# Patient Record
Sex: Male | Born: 1945
Health system: Southern US, Community
[De-identification: ages and names within clinical notes are randomized; demographics above are authoritative.]

## PROBLEM LIST (undated history)

## (undated) DIAGNOSIS — M25519 Pain in unspecified shoulder: Secondary | ICD-10-CM

## (undated) DIAGNOSIS — R011 Cardiac murmur, unspecified: Secondary | ICD-10-CM

## (undated) DIAGNOSIS — K759 Inflammatory liver disease, unspecified: Secondary | ICD-10-CM

## (undated) HISTORY — DX: Cardiac murmur, unspecified: R01.1

## (undated) HISTORY — DX: Pain in unspecified shoulder: M25.519

## (undated) HISTORY — PX: TONSILLECTOMY: SHX5217

## (undated) HISTORY — PX: TONSILLECTOMY: SUR1361

## (undated) HISTORY — PX: OTHER SURGICAL HISTORY: SHX169

---
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
## 1997-07-19 ENCOUNTER — Emergency Department (HOSPITAL_COMMUNITY): Admission: EM | Admit: 1997-07-19 | Discharge: 1997-07-19 | Payer: Self-pay | Admitting: Emergency Medicine

## 1998-03-24 ENCOUNTER — Ambulatory Visit (HOSPITAL_COMMUNITY): Admission: RE | Admit: 1998-03-24 | Discharge: 1998-03-24 | Payer: Self-pay | Admitting: Gastroenterology

## 2004-02-29 ENCOUNTER — Ambulatory Visit (HOSPITAL_COMMUNITY): Admission: RE | Admit: 2004-02-29 | Discharge: 2004-02-29 | Payer: Self-pay | Admitting: Gastroenterology

## 2009-08-01 DIAGNOSIS — M25519 Pain in unspecified shoulder: Secondary | ICD-10-CM

## 2009-08-01 HISTORY — DX: Pain in unspecified shoulder: M25.519

## 2009-11-12 ENCOUNTER — Encounter: Admission: RE | Admit: 2009-11-12 | Discharge: 2009-11-12 | Payer: Self-pay | Admitting: Cardiology

## 2009-11-12 ENCOUNTER — Ambulatory Visit: Payer: Self-pay | Admitting: Cardiology

## 2009-11-25 ENCOUNTER — Ambulatory Visit (HOSPITAL_COMMUNITY): Admission: RE | Admit: 2009-11-25 | Discharge: 2009-11-25 | Payer: Self-pay | Admitting: Cardiology

## 2009-11-25 ENCOUNTER — Ambulatory Visit: Payer: Self-pay | Admitting: Cardiology

## 2010-08-19 NOTE — Op Note (Signed)
[name], [name]                ACCOUNT NO.:  [id number]   MEDICAL RECORD NO.:  [id number]          PATIENT TYPE:  AMB   LOCATION:  ENDO                         FACILITY:  [facility]   PHYSICIAN:  Bernette Redbird, M.D.   DATE OF BIRTH:  Sep 05, 1945   DATE OF PROCEDURE:  02/29/2004  DATE OF DISCHARGE:                                 OPERATIVE REPORT   PROCEDURE:  Colonoscopy.   INDICATIONS:  Screening for colon cancer in a 65 year old gentleman with a  family history of colon cancer in his father who was diagnosed around age  19.   FINDINGS:  Normal exam.   PROCEDURE:  The major purpose and risks of the procedure were familiar to  the patient from prior examination, and he provided written consent.  Sedation was fentanyl 50 cg and Versed 6 mg IV without arrhythmias or  desaturation. Digital exam of the prostate was unremarkable. The Olympus  adjustable tension pediatric video colonoscope was readily advanced to the  proximal colon and then advanced into the cecum by having the patient turn  into the supine position. I was able to enter the terminal ileum for a short  distance, and it appeared normal.   This was a normal examination, other than perhaps some mild diverticulosis.  No polyps, cancer, colitis, or vascular malformations were observed, and no  definite or extensive diverticulosis were appreciated, but I do think there  was a small amount distally. Retroflexion of the rectum was unremarkable.  Pull out through the anal canal demonstrated moderate internal hemorrhoids.   No biopsies were obtained. The patient tolerated the procedure well, and  there were no apparent complications.   IMPRESSION:  Family history of colon cancer without worrisome findings on  current examination (V16.0).   PLAN:  Followup colonoscopy in about 5 years.      RB/MEDQ  D:  02/29/2004  T:  02/29/2004  Job:  [id number]

## 2010-09-07 ENCOUNTER — Other Ambulatory Visit: Payer: Self-pay | Admitting: Cardiology

## 2010-09-07 DIAGNOSIS — F419 Anxiety disorder, unspecified: Secondary | ICD-10-CM

## 2010-09-07 NOTE — Telephone Encounter (Signed)
escribe request  

## 2010-09-14 ENCOUNTER — Other Ambulatory Visit: Payer: Self-pay | Admitting: *Deleted

## 2010-09-14 DIAGNOSIS — E785 Hyperlipidemia, unspecified: Secondary | ICD-10-CM

## 2010-09-14 DIAGNOSIS — I10 Essential (primary) hypertension: Secondary | ICD-10-CM

## 2010-09-14 MED ORDER — LISINOPRIL 40 MG PO TABS: 40.0000 mg | ORAL_TABLET | Freq: Every day | ORAL | Status: DC

## 2010-09-14 MED ORDER — FINASTERIDE 5 MG PO TABS: 5.0000 mg | ORAL_TABLET | Freq: Every day | ORAL | Status: DC

## 2010-09-14 MED ORDER — BUPROPION HCL 100 MG PO TABS: 100.0000 mg | ORAL_TABLET | Freq: Two times a day (BID) | ORAL | Status: DC

## 2010-09-14 MED ORDER — SIMVASTATIN 20 MG PO TABS: 20.0000 mg | ORAL_TABLET | Freq: Every evening | ORAL | Status: DC

## 2010-09-14 NOTE — Telephone Encounter (Signed)
Refilled meds per fax request. Faxed signed rx's to [facility]

## 2010-12-15 ENCOUNTER — Other Ambulatory Visit: Payer: Self-pay | Admitting: Cardiology

## 2010-12-15 DIAGNOSIS — N400 Enlarged prostate without lower urinary tract symptoms: Secondary | ICD-10-CM

## 2010-12-15 DIAGNOSIS — E785 Hyperlipidemia, unspecified: Secondary | ICD-10-CM

## 2010-12-15 DIAGNOSIS — I119 Hypertensive heart disease without heart failure: Secondary | ICD-10-CM

## 2010-12-15 DIAGNOSIS — F329 Major depressive disorder, single episode, unspecified: Secondary | ICD-10-CM

## 2010-12-15 NOTE — Telephone Encounter (Signed)
escribe request  

## 2010-12-24 ENCOUNTER — Encounter: Payer: Self-pay | Admitting: Cardiology

## 2011-01-04 ENCOUNTER — Ambulatory Visit (INDEPENDENT_AMBULATORY_CARE_PROVIDER_SITE_OTHER): Payer: Medicare Other | Admitting: *Deleted

## 2011-01-04 DIAGNOSIS — E7800 Pure hypercholesterolemia, unspecified: Secondary | ICD-10-CM

## 2011-01-04 LAB — HEPATIC FUNCTION PANEL
AST: 23 U/L (ref 0–37)
Albumin: 4.1 g/dL (ref 3.5–5.2)

## 2011-01-04 LAB — LIPID PANEL
LDL Cholesterol: 84 mg/dL (ref 0–99)
Triglycerides: 49 mg/dL (ref 0.0–149.0)

## 2011-01-04 LAB — BASIC METABOLIC PANEL
BUN: 22 mg/dL (ref 6–23)
CO2: 27 mEq/L (ref 19–32)
Calcium: 8.7 mg/dL (ref 8.4–10.5)
GFR: 79.12 mL/min (ref >60.00)

## 2011-01-10 ENCOUNTER — Ambulatory Visit: Payer: Self-pay | Admitting: Cardiology

## 2011-01-12 ENCOUNTER — Ambulatory Visit (INDEPENDENT_AMBULATORY_CARE_PROVIDER_SITE_OTHER): Payer: Medicare Other | Admitting: Cardiology

## 2011-01-12 ENCOUNTER — Encounter: Payer: Self-pay | Admitting: Cardiology

## 2011-01-12 VITALS — BP 142/91 | HR 66 | Ht 72.0 in | Wt 186.0 lb

## 2011-01-12 DIAGNOSIS — I350 Nonrheumatic aortic (valve) stenosis: Secondary | ICD-10-CM

## 2011-01-12 DIAGNOSIS — E7800 Pure hypercholesterolemia, unspecified: Secondary | ICD-10-CM

## 2011-01-12 DIAGNOSIS — N138 Other obstructive and reflux uropathy: Secondary | ICD-10-CM | POA: Insufficient documentation

## 2011-01-12 DIAGNOSIS — I119 Hypertensive heart disease without heart failure: Secondary | ICD-10-CM

## 2011-01-12 DIAGNOSIS — I359 Nonrheumatic aortic valve disorder, unspecified: Secondary | ICD-10-CM

## 2011-01-12 DIAGNOSIS — N401 Enlarged prostate with lower urinary tract symptoms: Secondary | ICD-10-CM

## 2011-01-12 DIAGNOSIS — N400 Enlarged prostate without lower urinary tract symptoms: Secondary | ICD-10-CM

## 2011-01-12 DIAGNOSIS — E785 Hyperlipidemia, unspecified: Secondary | ICD-10-CM

## 2011-01-12 NOTE — Progress Notes (Signed)
Art Buff Date of Birth:  1945-12-21 [facility] [address]   [address] [place], [place]  [address] [phone]           Fax   [phone]  History of Present Illness: This pleasant 65 year old gentleman is seen for a scheduled followup office visit.  He has a past history of labile hypertension and a heart murmur, consistent with moderate aortic stenosis.  Also, has a history of benign prostatic hypertrophy.  Since last, visit he's been feeling well.  He is not expressing any chest pain or shortness of breath.  He has no specific complaints today.  Current Outpatient Prescriptions  Medication Sig Dispense Refill  . ALPRAZolam (XANAX) 1 MG tablet TAKE ONE TABLET BY MOUTH AT BEDTIME.  90 tablet  1  . aspirin 81 MG tablet Take 81 mg by mouth daily.        [name] buPROPion (WELLBUTRIN) 100 MG tablet TAKE 1 TABLET TWICE DAILY  180 tablet  3  . calcium carbonate (OS-CAL) 600 MG TABS Take 600 mg by mouth daily.        [name] CINNAMON PO Take 2,000 mg by mouth daily.        . Cyanocobalamin (VITAMIN B-12 PO) Take by mouth.        . dutasteride (AVODART) 0.5 MG capsule Take 0.5 mg by mouth daily.        . finasteride (PROSCAR) 5 MG tablet TAKE 1 TABLET DAILY  90 tablet  3  . fish oil-omega-3 fatty acids 1000 MG capsule Take 2 g by mouth daily.        . Flaxseed, Linseed, (FLAXSEED OIL PO) Take 2,600 mg by mouth daily.        . Garlic 2000 MG CAPS Take 4,000 mg by mouth daily.        . Glucosamine HCl-Glucosamin SO4 (GLUCOSAMINE COMPLEX PO) Take 4,000 mg by mouth daily.        [name] lisinopril (PRINIVIL,ZESTRIL) 40 MG tablet TAKE 1 TABLET DAILY  90 tablet  3  . MAGNESIUM PO Take by mouth.        . NON FORMULARY Take by mouth daily. LAX CLEAR MIX WITH WATER       . Red Yeast Rice Extract (RED YEAST RICE PO) Take by mouth.        . Saw Palmetto, Serenoa repens, (SAW PALMETTO PO) Take by mouth.        . simvastatin (ZOCOR) 20 MG tablet TAKE 1 TABLET AT BEDTIME  90 tablet  3    . vitamin E 400 UNIT capsule Take 800 Units by mouth daily.          No Known Allergies  Patient Active Problem List  Diagnoses  . Aortic stenosis    History  Smoking status  . Former Smoker -- 2.0 packs/day for 8 years  . Types: Cigarettes  . Quit date: 01/11/1969  Smokeless tobacco  . Never Used    History  Alcohol Use No    Family History  Problem Relation Age of Onset  . Cancer Father     Liver  . Heart disease Mother   . Multiple myeloma Brother     Review of Systems: Constitutional: no fever chills diaphoresis or fatigue or change in weight.  Head and neck: no hearing loss, no epistaxis, no photophobia or visual disturbance. Respiratory: No cough, shortness of breath or wheezing. Cardiovascular: No chest pain peripheral edema, palpitations. Gastrointestinal: No abdominal distention,  no abdominal pain, no change in bowel habits hematochezia or melena. Genitourinary: No dysuria, no frequency, no urgency, no nocturia. Musculoskeletal:No arthralgias, no back pain, no gait disturbance or myalgias. Neurological: No dizziness, no headaches, no numbness, no seizures, no syncope, no weakness, no tremors. Hematologic: No lymphadenopathy, no easy bruising. Psychiatric: No confusion, no hallucinations, no sleep disturbance.    Physical Exam: Filed Vitals:   01/12/11 1348  BP: 142/91  Pulse: 66   his blood pressure on recheck at the end of the exam is better at 134/80.Pupils equal and reactive.   Extraocular Movements are full.  There is no scleral icterus.  The mouth and pharynx are normal.  The neck is supple.  The carotids reveal no bruits.  The jugular venous pressure is normal.  The thyroid is not enlarged.  There is no lymphadenopathy.  The chest is clear to percussion and auscultation. There are no rales or rhonchi. Expansion of the chest is symmetrical.  Heart reveals a grade 3/6 harsh systolic ejection murmur at the base. The abdomen is soft and nontender.  Bowel sounds are normal. The liver and spleen are not enlarged. There Are no abdominal masses. There are no bruits.  Rectal exam reveals 2+ enlarged prostate without hard nodules.  Stool brown, benzidine negative.   The pedal pulses are good.  There is no phlebitis or edema.  There is no cyanosis or clubbing. Strength is normal and symmetrical in all extremities.  There is no lateralizing weakness.  There are no sensory deficits.  The skin is warm and dry.  There is no rash.  His EKG today shows normal sinus rhythm and is within normal limits.  This was interpreted by me   Assessment / Plan:  Continue same medication.  Recheck in one year for followup visit.  Consider repeat echocardiogram at that time

## 2011-01-12 NOTE — Assessment & Plan Note (Signed)
Patient has symptoms of urgency and frequency.  Examination today does reveal moderate enlargement of the prostate without any hard nodules.  His PSAs in the past have been normal.  He is already on 2 medications for his prostate and is still having symptoms.  He will consult his urologist, Dr. Gaynelle Arabian

## 2011-01-12 NOTE — Assessment & Plan Note (Signed)
Patient has a history of mild essential hypertension.  Not expressing any headaches or dizzy spells.  His exercise tolerance is good.  He is not having any cough or side effects from the lisinopril.

## 2011-01-12 NOTE — Assessment & Plan Note (Signed)
The patient is on simvastatin for his hypercholesterolemia.  He has not had any side effects from the simvastatin.  His lipids are excellent

## 2011-01-12 NOTE — Assessment & Plan Note (Signed)
The patient has a history of aortic stenosis.  His last echocardiogram was on 11/25/09 and showed a peak instantaneous gradient of 49 and a mean gradient of 32.  The patient denies any exertional dyspnea, chest pain, dizziness, or syncope.

## 2011-03-21 ENCOUNTER — Other Ambulatory Visit: Payer: Self-pay | Admitting: Cardiology

## 2011-03-21 DIAGNOSIS — F419 Anxiety disorder, unspecified: Secondary | ICD-10-CM

## 2011-03-27 ENCOUNTER — Other Ambulatory Visit: Payer: Self-pay | Admitting: *Deleted

## 2011-03-27 DIAGNOSIS — F419 Anxiety disorder, unspecified: Secondary | ICD-10-CM

## 2011-03-27 MED ORDER — ALPRAZOLAM 1 MG PO TABS: 1.0000 mg | ORAL_TABLET | Freq: Every evening | ORAL | Status: DC | PRN

## 2011-03-27 NOTE — Telephone Encounter (Signed)
Refill on alprazolam 

## 2011-10-31 ENCOUNTER — Other Ambulatory Visit: Payer: Self-pay | Admitting: Cardiology

## 2011-10-31 NOTE — Telephone Encounter (Signed)
..   Requested Prescriptions   Signed Prescriptions Disp Refills  . lisinopril (PRINIVIL,ZESTRIL) 40 MG tablet 90 tablet 2    Sig: TAKE 1 TABLET DAILY    Authorizing Provider: Rollene Rotunda    Ordering User: Jatniel Verastegui M  . simvastatin (ZOCOR) 20 MG tablet 90 tablet 3    Sig: TAKE 1 TABLET AT BEDTIME    Authorizing Provider: Rollene Rotunda    Ordering User: Annamay Laymon M   Refused Prescriptions Disp Refills  . buPROPion (WELLBUTRIN) 100 MG tablet [Pharmacy Med Name: BUPROPION    TAB 100MG ] 180 tablet PRN    Sig: TAKE 1 TABLET TWICE DAILY    Refused By: Christella Hartigan, Lenni Reckner M    Reason for Refusal: Refill not appropriate  . finasteride (PROSCAR) 5 MG tablet [Pharmacy Med Name: FINASTERIDE  TAB 5MG ] 90 tablet PRN    Sig: TAKE 1 TABLET DAILY    Refused By: Christella Hartigan, Hartley Wyke M    Reason for Refusal: Refill not appropriate

## 2011-11-21 ENCOUNTER — Encounter: Payer: Self-pay | Admitting: *Deleted

## 2011-11-22 ENCOUNTER — Encounter: Payer: Self-pay | Admitting: Cardiology

## 2011-11-22 ENCOUNTER — Ambulatory Visit (INDEPENDENT_AMBULATORY_CARE_PROVIDER_SITE_OTHER): Payer: Medicare Other | Admitting: Cardiology

## 2011-11-22 VITALS — BP 120/78 | HR 80 | Ht 72.0 in | Wt 185.0 lb

## 2011-11-22 DIAGNOSIS — I350 Nonrheumatic aortic (valve) stenosis: Secondary | ICD-10-CM

## 2011-11-22 DIAGNOSIS — N401 Enlarged prostate with lower urinary tract symptoms: Secondary | ICD-10-CM

## 2011-11-22 DIAGNOSIS — E7800 Pure hypercholesterolemia, unspecified: Secondary | ICD-10-CM

## 2011-11-22 DIAGNOSIS — F419 Anxiety disorder, unspecified: Secondary | ICD-10-CM

## 2011-11-22 DIAGNOSIS — N400 Enlarged prostate without lower urinary tract symptoms: Secondary | ICD-10-CM

## 2011-11-22 DIAGNOSIS — I359 Nonrheumatic aortic valve disorder, unspecified: Secondary | ICD-10-CM

## 2011-11-22 DIAGNOSIS — I119 Hypertensive heart disease without heart failure: Secondary | ICD-10-CM

## 2011-11-22 DIAGNOSIS — F411 Generalized anxiety disorder: Secondary | ICD-10-CM

## 2011-11-22 MED ORDER — ALPRAZOLAM 1 MG PO TABS: 1.0000 mg | ORAL_TABLET | Freq: Every evening | ORAL | Status: DC | PRN

## 2011-11-22 NOTE — Patient Instructions (Signed)
Your physician recommends that you continue on your current medications as directed. Please refer to the Current Medication list given to you today.  Follow up with  Dr. Patty Sermons in October

## 2011-11-22 NOTE — Progress Notes (Signed)
Art Buff Date of Birth:  1945/04/13 [facility] [address] [place], [place]  [address] [phone]  Fax   [phone]  HPI: This pleasant 66 year old gentleman is seen for a scheduled followup office visit. He has a past history of labile hypertension and a heart murmur, consistent with moderate aortic stenosis. Also, has a history of benign prostatic hypertrophy. Since last, visit he's been feeling well. He is not expressing any chest pain or shortness of breath. He has no specific complaints today. He came in today because he had run out of some of his medication.  He has not been experiencing any new cardiac symptoms.  Current Outpatient Prescriptions  Medication Sig Dispense Refill  . ALPRAZolam (XANAX) 1 MG tablet Take 1 tablet (1 mg total) by mouth at bedtime as needed for sleep.  90 tablet  3  . aspirin 81 MG tablet Take 81 mg by mouth daily.        [name] buPROPion (WELLBUTRIN) 100 MG tablet TAKE 1 TABLET TWICE DAILY  180 tablet  3  . calcium carbonate (OS-CAL) 600 MG TABS Take 600 mg by mouth daily.        [name] CINNAMON PO Take 2,000 mg by mouth daily.        . Cyanocobalamin (VITAMIN B-12 PO) Take by mouth.        . dutasteride (AVODART) 0.5 MG capsule Take 0.5 mg by mouth daily.        . finasteride (PROSCAR) 5 MG tablet TAKE 1 TABLET DAILY  90 tablet  3  . fish oil-omega-3 fatty acids 1000 MG capsule Take 2 g by mouth daily.        . Flaxseed, Linseed, (FLAXSEED OIL PO) Take 2,600 mg by mouth daily.        . Garlic 2000 MG CAPS Take 4,000 mg by mouth daily.        . Glucosamine HCl-Glucosamin SO4 (GLUCOSAMINE COMPLEX PO) Take 4,000 mg by mouth daily.        [name] lisinopril (PRINIVIL,ZESTRIL) 40 MG tablet TAKE 1 TABLET DAILY  90 tablet  2  . MAGNESIUM PO Take by mouth.        . NON FORMULARY Take by mouth daily. LAX CLEAR MIX WITH WATER       . Red Yeast Rice Extract (RED YEAST RICE PO) Take by mouth.        . Saw Palmetto, Serenoa repens, (SAW PALMETTO  PO) Take by mouth.        . simvastatin (ZOCOR) 20 MG tablet TAKE 1 TABLET AT BEDTIME  90 tablet  3  . vitamin E 400 UNIT capsule Take 800 Units by mouth daily.        . Tamsulosin HCl (FLOMAX) 0.4 MG CAPS as directed.        No Known Allergies  Patient Active Problem List  Diagnosis  . Aortic stenosis  . Benign hypertensive heart disease without heart failure  . Dyslipidemia  . BPH with obstruction/lower urinary tract symptoms    History  Smoking status  . Former Smoker -- 2.0 packs/day for 8 years  . Types: Cigarettes  . Quit date: 01/11/1969  Smokeless tobacco  . Never Used    History  Alcohol Use No    Family History  Problem Relation Age of Onset  . Liver cancer Father   . Heart disease Mother   . Multiple myeloma Brother     Review of Systems: The patient denies any heat  or cold intolerance.  No weight gain or weight loss.  The patient denies headaches or blurry vision.  There is no cough or sputum production.  The patient denies dizziness.  There is no hematuria or hematochezia.  The patient denies any muscle aches or arthritis.  The patient denies any rash.  The patient denies frequent falling or instability.  There is no history of depression or anxiety.  All other systems were reviewed and are negative.   Physical Exam: Filed Vitals:   11/22/11 1147  BP: 120/78  Pulse: 80   the general appearance reveals a well-developed well-nourished gentleman in no distress.The head and neck exam reveals pupils equal and reactive.  Extraocular movements are full.  There is no scleral icterus.  The mouth and pharynx are normal.  The neck is supple.  The carotids reveal no bruits.  The jugular venous pressure is normal.  The  thyroid is not enlarged.  There is no lymphadenopathy.  The chest is clear to percussion and auscultation.  There are no rales or rhonchi.  Expansion of the chest is symmetrical.  The precordium is quiet.  The first heart sound is normal.  The second heart  sound is physiologically split.  There is no  gallop rub or click.  There is a soft systolic ejection murmur grade 2/6 at the aortic area.  No diastolic murmur.  There is no abnormal lift or heave.  The abdomen is soft and nontender.  The bowel sounds are normal.  The liver and spleen are not enlarged.  There are no abdominal masses.  There are no abdominal bruits.  Extremities reveal good pedal pulses.  There is no phlebitis or edema.  There is no cyanosis or clubbing.  Strength is normal and symmetrical in all extremities.  There is no lateralizing weakness.  There are no sensory deficits.  The skin is warm and dry.  There is no rash.     Assessment / Plan: Continue same medication.  Return in October for comprehensive medical exam with full lab work.

## 2011-11-22 NOTE — Assessment & Plan Note (Signed)
No chest pain or shortness of breath.  No palpitations.  No dizziness or syncope. 

## 2012-01-17 ENCOUNTER — Other Ambulatory Visit (INDEPENDENT_AMBULATORY_CARE_PROVIDER_SITE_OTHER): Payer: Medicare Other | Admitting: *Deleted

## 2012-01-17 DIAGNOSIS — N401 Enlarged prostate with lower urinary tract symptoms: Secondary | ICD-10-CM

## 2012-01-17 DIAGNOSIS — I119 Hypertensive heart disease without heart failure: Secondary | ICD-10-CM

## 2012-01-17 DIAGNOSIS — N400 Enlarged prostate without lower urinary tract symptoms: Secondary | ICD-10-CM

## 2012-01-17 DIAGNOSIS — E7800 Pure hypercholesterolemia, unspecified: Secondary | ICD-10-CM

## 2012-01-17 LAB — CBC WITH DIFFERENTIAL/PLATELET
Basophils Absolute: 0 10*3/uL (ref 0.0–0.1)
Basophils Relative: 0.4 % (ref 0.0–3.0)
Eosinophils Relative: 1.9 % (ref 0.0–5.0)
HCT: 41.7 % (ref 39.0–52.0)
Hemoglobin: 13.8 g/dL (ref 13.0–17.0)
Lymphocytes Relative: 25.6 % (ref 12.0–46.0)
Monocytes Relative: 8.1 % (ref 3.0–12.0)
Neutro Abs: 4.5 10*3/uL (ref 1.4–7.7)
RBC: 4.21 Mil/uL — ABNORMAL LOW (ref 4.22–5.81)
RDW: 13.6 % (ref 11.5–14.6)
WBC: 7 10*3/uL (ref 4.5–10.5)

## 2012-01-17 LAB — HEPATIC FUNCTION PANEL
ALT: 17 U/L (ref 0–53)
AST: 22 U/L (ref 0–37)
Alkaline Phosphatase: 39 U/L (ref 39–117)
Bilirubin, Direct: 0.1 mg/dL (ref 0.0–0.3)
Total Bilirubin: 0.8 mg/dL (ref 0.3–1.2)

## 2012-01-17 LAB — BASIC METABOLIC PANEL
Calcium: 9.2 mg/dL (ref 8.4–10.5)
Chloride: 107 mEq/L (ref 96–112)
GFR: 73.74 mL/min (ref >60.00)
Sodium: 142 mEq/L (ref 135–145)

## 2012-01-17 LAB — LIPID PANEL
Cholesterol: 138 mg/dL (ref 0–200)
LDL Cholesterol: 83 mg/dL (ref 0–99)
Total CHOL/HDL Ratio: 3

## 2012-01-17 NOTE — Progress Notes (Signed)
Quick Note:  Please make copy of labs for patient visit. ______ 

## 2012-01-22 ENCOUNTER — Ambulatory Visit (INDEPENDENT_AMBULATORY_CARE_PROVIDER_SITE_OTHER): Payer: Medicare Other | Admitting: Cardiology

## 2012-01-22 ENCOUNTER — Encounter: Payer: Self-pay | Admitting: Cardiology

## 2012-01-22 VITALS — BP 120/77 | HR 79 | Ht 73.0 in | Wt 190.0 lb

## 2012-01-22 DIAGNOSIS — N138 Other obstructive and reflux uropathy: Secondary | ICD-10-CM

## 2012-01-22 DIAGNOSIS — I350 Nonrheumatic aortic (valve) stenosis: Secondary | ICD-10-CM

## 2012-01-22 DIAGNOSIS — I119 Hypertensive heart disease without heart failure: Secondary | ICD-10-CM

## 2012-01-22 DIAGNOSIS — F411 Generalized anxiety disorder: Secondary | ICD-10-CM

## 2012-01-22 DIAGNOSIS — F419 Anxiety disorder, unspecified: Secondary | ICD-10-CM

## 2012-01-22 DIAGNOSIS — N401 Enlarged prostate with lower urinary tract symptoms: Secondary | ICD-10-CM

## 2012-01-22 DIAGNOSIS — E785 Hyperlipidemia, unspecified: Secondary | ICD-10-CM

## 2012-01-22 DIAGNOSIS — I359 Nonrheumatic aortic valve disorder, unspecified: Secondary | ICD-10-CM

## 2012-01-22 MED ORDER — ALPRAZOLAM 1 MG PO TABS: 1.0000 mg | ORAL_TABLET | Freq: Every evening | ORAL | Status: DC | PRN

## 2012-01-22 NOTE — Assessment & Plan Note (Signed)
The patient has a past history of BPH.  He is followed by Dr. Darvin Neighbours and had a recent checkup and was told everything was stable.

## 2012-01-22 NOTE — Progress Notes (Signed)
[name] [name] Date of Birth:  [birth year] [facility] [address] [place], [place]  [address] [phone]         Fax   [phone]  History of Present Illness: This pleasant 66 year old gentleman is seen for a scheduled followup office visit. He has a past history of labile hypertension and a heart murmur, consistent with moderate aortic stenosis. Also, has a history of benign prostatic hypertrophy. Since last, visit he's been feeling well. He is not expressing any chest pain .  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  On several occasions while walking up some steep hills he has noticed some mild exertional dyspnea.  He has not been as physically active lately and his weight is up 5 pounds.  He and his wife are getting ready to go on a 17 day cruise to [place] next week.   Current Outpatient Prescriptions  Medication Sig Dispense Refill  . ALPRAZolam (XANAX) 1 MG tablet Take 1 tablet (1 mg total) by mouth at bedtime as needed for sleep.  90 tablet  1  . aspirin 81 MG tablet Take 81 mg by mouth daily.        [name] buPROPion (WELLBUTRIN) 100 MG tablet TAKE 1 TABLET TWICE DAILY  180 tablet  3  . calcium carbonate (OS-CAL) 600 MG TABS Take 600 mg by mouth daily.        [name] CINNAMON PO Take 2,000 mg by mouth daily.        . Cyanocobalamin (VITAMIN B-12 PO) Take by mouth.        . dutasteride (AVODART) 0.5 MG capsule Take 0.5 mg by mouth daily.        . finasteride (PROSCAR) 5 MG tablet TAKE 1 TABLET DAILY  90 tablet  3  . fish oil-omega-3 fatty acids 1000 MG capsule Take 2 g by mouth daily.        . Flaxseed, Linseed, (FLAXSEED OIL PO) Take 2,600 mg by mouth daily.        . Garlic 2000 MG CAPS Take 4,000 mg by mouth daily.        . Glucosamine HCl-Glucosamin SO4 (GLUCOSAMINE COMPLEX PO) Take 4,000 mg by mouth daily.        [name] lisinopril (PRINIVIL,ZESTRIL) 40 MG tablet TAKE 1 TABLET DAILY  90 tablet  2  . MAGNESIUM PO Take by mouth.        . NON  FORMULARY Take by mouth daily. LAX CLEAR MIX WITH WATER       . Red Yeast Rice Extract (RED YEAST RICE PO) Take by mouth.        . Saw Palmetto, Serenoa repens, (SAW PALMETTO PO) Take by mouth.        . simvastatin (ZOCOR) 20 MG tablet TAKE 1 TABLET AT BEDTIME  90 tablet  3  . Tamsulosin HCl (FLOMAX) 0.4 MG CAPS Take 0.4 mg by mouth daily.       . vitamin E 400 UNIT capsule Take 800 Units by mouth daily.          No Known Allergies  Patient Active Problem List  Diagnosis  . Aortic stenosis  . Benign hypertensive heart disease without heart failure  . Dyslipidemia  . BPH with obstruction/lower urinary tract symptoms    History  Smoking status  . Former Smoker -- 2.0 packs/day for 8 years  . Types: Cigarettes  . Quit date: 01/11/1969  Smokeless tobacco  . Never Used  History  Alcohol Use No    Family History  Problem Relation Age of Onset  . Liver cancer Father   . Heart disease Mother   . Multiple myeloma Brother     Review of Systems: Constitutional: no fever chills diaphoresis or fatigue or change in weight.  Head and neck: no hearing loss, no epistaxis, no photophobia or visual disturbance. Respiratory: No cough, shortness of breath or wheezing. Cardiovascular: No chest pain peripheral edema, palpitations. Gastrointestinal: No abdominal distention, no abdominal pain, no change in bowel habits hematochezia or melena. Genitourinary: No dysuria, no frequency, no urgency, no nocturia. Musculoskeletal:No arthralgias, no back pain, no gait disturbance or myalgias. Neurological: No dizziness, no headaches, no numbness, no seizures, no syncope, no weakness, no tremors. Hematologic: No lymphadenopathy, no easy bruising. Psychiatric: No confusion, no hallucinations, no sleep disturbance.    Physical Exam: Filed Vitals:   01/22/12 1457  BP: 120/77  Pulse: 79   the general appearance reveals a well-developed well-nourished gentleman in no distress.The head and neck  exam reveals pupils equal and reactive.  Extraocular movements are full.  There is no scleral icterus.  The mouth and pharynx are normal.  The neck is supple.  The carotids reveal no bruits.  The jugular venous pressure is normal.  The  thyroid is not enlarged.  There is no lymphadenopathy.  The chest is clear to percussion and auscultation.  There are no rales or rhonchi.  Expansion of the chest is symmetrical.  The precordium is quiet.  The first heart sound is normal.  The second heart sound is physiologically split.  There is a grade 3/6 harsh systolic ejection murmur at the left sternal edge.  There is no diastolic murmur.  No gallop or rub.  There is no abnormal lift or heave.  The abdomen is soft and nontender.  The bowel sounds are normal.  The liver and spleen are not enlarged.  There are no abdominal masses.  There are no abdominal bruits.  Extremities reveal good pedal pulses.  There is no phlebitis or edema.  There is no cyanosis or clubbing.  Strength is normal and symmetrical in all extremities.  There is no lateralizing weakness.  There are no sensory deficits.  The skin is warm and dry.  There is no rash.   EKG today is normal and shows no evidence of LVH or strain.  Assessment / Plan: Continue same medication.  Return in one month for echocardiogram.  Return in one year for followup annual checkup.  He will also be working on getting a primary care physician.  We looked back on his records.  His last colonoscopy was in 2005.  He has a family history of colon cancer.  He anticipates another colonoscopy at the 10 year mark

## 2012-01-22 NOTE — Patient Instructions (Signed)
Your physician wants you to follow-up in: 1 year You will receive a reminder letter in the mail two months in advance. If you don't receive a letter, please call our office to schedule the follow-up appointment.   Find a Primary Care Physician  Your physician has requested that you have an echocardiogram. Echocardiography is a painless test that uses sound waves to create images of your heart. It provides your doctor with information about the size and shape of your heart and how well your heart's chambers and valves are working. This procedure takes approximately one hour. There are no restrictions for this procedure.

## 2012-01-22 NOTE — Assessment & Plan Note (Signed)
The patient has a past history of dyslipidemia and is doing well on low-dose simvastatin.  We reviewed his recent labs which are satisfactory.

## 2012-01-22 NOTE — Assessment & Plan Note (Signed)
The patient has not been having any headaches or dizzy spells.  No palpitations.

## 2012-01-22 NOTE — Assessment & Plan Note (Signed)
The patient has been experiencing mild exertional dyspnea walking up [facility].  When he returns from his [place] trip we will have him return for an echocardiogram to followup on his aortic stenosis.

## 2012-02-22 ENCOUNTER — Ambulatory Visit (HOSPITAL_COMMUNITY): Payer: Medicare Other | Attending: Internal Medicine

## 2012-02-22 DIAGNOSIS — I079 Rheumatic tricuspid valve disease, unspecified: Secondary | ICD-10-CM | POA: Insufficient documentation

## 2012-02-22 DIAGNOSIS — E785 Hyperlipidemia, unspecified: Secondary | ICD-10-CM | POA: Insufficient documentation

## 2012-02-22 DIAGNOSIS — I359 Nonrheumatic aortic valve disorder, unspecified: Secondary | ICD-10-CM

## 2012-02-22 DIAGNOSIS — I10 Essential (primary) hypertension: Secondary | ICD-10-CM | POA: Insufficient documentation

## 2012-02-22 DIAGNOSIS — I350 Nonrheumatic aortic (valve) stenosis: Secondary | ICD-10-CM

## 2012-02-22 NOTE — Progress Notes (Signed)
Echocardiogram performed.  

## 2012-02-23 ENCOUNTER — Other Ambulatory Visit: Payer: Self-pay

## 2012-02-23 MED ORDER — SIMVASTATIN 20 MG PO TABS
20.0000 mg | ORAL_TABLET | Freq: Every day | ORAL | Status: DC
Start: 2012-02-23 — End: 2012-02-27

## 2012-02-26 ENCOUNTER — Telehealth: Payer: Self-pay | Admitting: *Deleted

## 2012-02-26 DIAGNOSIS — F329 Major depressive disorder, single episode, unspecified: Secondary | ICD-10-CM

## 2012-02-26 MED ORDER — LISINOPRIL 40 MG PO TABS: 40.0000 mg | ORAL_TABLET | Freq: Every day | ORAL | Status: DC

## 2012-02-26 MED ORDER — BUPROPION HCL 100 MG PO TABS: 100.0000 mg | ORAL_TABLET | Freq: Two times a day (BID) | ORAL | Status: DC

## 2012-02-26 NOTE — Telephone Encounter (Signed)
Echo results given to patient. Requested chart so could have  Dr. Patty Sermons compare old echo with recent echo

## 2012-02-27 ENCOUNTER — Other Ambulatory Visit: Payer: Self-pay

## 2012-02-27 ENCOUNTER — Other Ambulatory Visit (HOSPITAL_COMMUNITY): Payer: Medicare Other

## 2012-02-27 MED ORDER — SIMVASTATIN 20 MG PO TABS: 20.0000 mg | ORAL_TABLET | Freq: Every day | ORAL | Status: DC

## 2012-03-07 ENCOUNTER — Telehealth: Payer: Self-pay | Admitting: Cardiology

## 2012-03-07 MED ORDER — FINASTERIDE 5 MG PO TABS: 5.0000 mg | ORAL_TABLET | Freq: Every day | ORAL | Status: DC

## 2012-03-07 NOTE — Telephone Encounter (Signed)
Refilled per wife request

## 2012-03-20 ENCOUNTER — Other Ambulatory Visit: Payer: Self-pay | Admitting: *Deleted

## 2012-03-20 DIAGNOSIS — F419 Anxiety disorder, unspecified: Secondary | ICD-10-CM

## 2012-03-20 MED ORDER — ALPRAZOLAM 1 MG PO TABS: 1.0000 mg | ORAL_TABLET | Freq: Every evening | ORAL | Status: DC | PRN

## 2012-06-26 ENCOUNTER — Telehealth: Payer: Self-pay | Admitting: Cardiology

## 2012-06-26 NOTE — Telephone Encounter (Signed)
Agree with plan 

## 2012-06-26 NOTE — Telephone Encounter (Signed)
Patient phoned in stating that he had been working out for the last month or so. While he is lifting weights or doing pilates he is ok but has chest tightness with cardiovascular exercise. Patient states riding bike or walking on treadmill he has tightness. Denies any tightness other times. Scheduled appointment with  Dr. Patty Sermons for 3/28. Advised patient to go to emergency department if tightness occurs with little exertion or at rest (call 911). Did advise to avoid exercise until seen. Patient verbalized understanding.

## 2012-06-26 NOTE — Telephone Encounter (Signed)
New problem    C/O chest pain.

## 2012-06-28 ENCOUNTER — Encounter (HOSPITAL_COMMUNITY): Payer: Self-pay | Admitting: Pharmacy Technician

## 2012-06-28 ENCOUNTER — Ambulatory Visit (INDEPENDENT_AMBULATORY_CARE_PROVIDER_SITE_OTHER): Payer: Medicare Other | Admitting: Cardiology

## 2012-06-28 ENCOUNTER — Encounter: Payer: Self-pay | Admitting: Cardiology

## 2012-06-28 ENCOUNTER — Ambulatory Visit (INDEPENDENT_AMBULATORY_CARE_PROVIDER_SITE_OTHER)
Admission: RE | Admit: 2012-06-28 | Discharge: 2012-06-28 | Disposition: A | Discharge status: Home / Self Care | Payer: Medicare Other | Source: Ambulatory Visit | Attending: Cardiology | Admitting: Cardiology

## 2012-06-28 ENCOUNTER — Encounter: Payer: Self-pay | Admitting: *Deleted

## 2012-06-28 VITALS — BP 130/84 | HR 67 | Ht 72.0 in | Wt 197.2 lb

## 2012-06-28 DIAGNOSIS — Z01818 Encounter for other preprocedural examination: Secondary | ICD-10-CM

## 2012-06-28 DIAGNOSIS — R0789 Other chest pain: Secondary | ICD-10-CM

## 2012-06-28 LAB — CBC WITH DIFFERENTIAL/PLATELET
Basophils Absolute: 0 10*3/uL (ref 0.0–0.1)
HCT: 40.5 % (ref 39.0–52.0)
Hemoglobin: 13.8 g/dL (ref 13.0–17.0)
Lymphs Abs: 2 10*3/uL (ref 0.7–4.0)
MCV: 95.2 fl (ref 78.0–100.0)
Monocytes Absolute: 0.6 10*3/uL (ref 0.1–1.0)
Monocytes Relative: 7.6 % (ref 3.0–12.0)
Neutro Abs: 5 10*3/uL (ref 1.4–7.7)
RDW: 13.6 % (ref 11.5–14.6)

## 2012-06-28 LAB — BASIC METABOLIC PANEL
BUN: 17 mg/dL (ref 6–23)
CO2: 27 mEq/L (ref 19–32)
Chloride: 104 mEq/L (ref 96–112)
GFR: 69.83 mL/min (ref >60.00)
Glucose, Bld: 99 mg/dL (ref 70–99)
Potassium: 3.9 mEq/L (ref 3.5–5.1)
Sodium: 139 mEq/L (ref 135–145)
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
## 2012-06-28 NOTE — Patient Instructions (Addendum)
   WILL NEED FOR YOU TO GO FOR A CHEST XRAY AT THE [facility] FROM [facility] Coronary Angiography Coronary angiography is an X-ray procedure used to look at the arteries in the heart. In this procedure, a dye is injected through a long, hollow tube (catheter). The catheter is about the size of a piece of cooked spaghetti. The catheter injects a dye into an artery in your groin. X-rays are then taken to show if there is a blockage in the arteries of your heart. BEFORE THE PROCEDURE   Let your caregiver know if you have allergies to shellfish or contrast dye. Also let your caregiver know if you have kidney problems or failure.  Do not eat or drink starting from midnight up to the time of the procedure, or as directed.  You may drink enough water to take your medications the morning of the procedure if you were instructed to do so.  You should be at the hospital or outpatient facility where the procedure is to be done 60 minutes prior to the procedure or as directed. PROCEDURE  You may be given an IV medication to help you relax before the procedure.  You will be prepared for the procedure by washing and shaving the area where the catheter will be inserted. This is usually done in the groin but may be done in the fold of your arm by your elbow.  A medicine will be given to numb your groin where the catheter will be inserted.  A specially trained doctor will insert the catheter into an artery in your groin. The catheter is guided by using a special type of X-ray (fluoroscopy) to the blood vessel being examined.  A special dye is then injected into the catheter and X-rays are taken. The dye helps to show where any narrowing or blockages are located in the heart arteries. AFTER THE PROCEDURE   After the procedure you will be kept in bed lying flat for several hours. You will be instructed to not bend or cross your legs.  The groin insertion site will be watched and  checked frequently.  The pulse in your feet will be checked frequently.  Additional blood tests, X-rays and an EKG may be done.  You may stay in the hospital overnight for observation. SEEK IMMEDIATE MEDICAL CARE IF:   You develop chest pain, shortness of breath, feel faint, or pass out.  There is bleeding, swelling, or drainage from the catheter insertion site.  You develop pain, discoloration, coldness, or severe bruising in the leg or area where the catheter was inserted.  You have a fever. Document Released: 09/24/2002 Document Revised: 06/12/2011 Document Reviewed: 11/13/2007 [facility] Patient Information 2013 [place], [place].

## 2012-06-29 NOTE — Progress Notes (Signed)
Quick Note:  Please report to patient. The recent labs are stable. Continue same medication and careful diet. The pre-cardiac catheterization blood work is fine ______

## 2012-07-01 ENCOUNTER — Telehealth: Payer: Self-pay | Admitting: *Deleted

## 2012-07-01 NOTE — Telephone Encounter (Signed)
Message copied by Burnell Blanks on Mon Jul 01, 2012  8:41 AM ------      Message from: Cassell Clement      Created: Sat Jun 29, 2012  4:54 PM       Please report to patient.  The recent labs are stable. Continue same medication and careful diet.  The pre-cardiac catheterization blood work is fine ------

## 2012-07-01 NOTE — Telephone Encounter (Signed)
Message copied by Burnell Blanks on Mon Jul 01, 2012  8:41 AM ------      Message from: Cassell Clement      Created: Fri Jun 28, 2012  3:23 PM       The chest x-ray is normal.  Please report. ------

## 2012-07-01 NOTE — Telephone Encounter (Signed)
Advised patient of lab results and chest xray  

## 2012-07-02 ENCOUNTER — Encounter (HOSPITAL_COMMUNITY)
Admission: RE | Disposition: A | Discharge status: Home / Self Care | Payer: Self-pay | Source: Ambulatory Visit | Attending: Cardiology

## 2012-07-02 ENCOUNTER — Ambulatory Visit (HOSPITAL_COMMUNITY)
Admission: RE | Admit: 2012-07-02 | Discharge: 2012-07-02 | Disposition: A | Discharge status: Home / Self Care | Payer: Medicare Other | Source: Ambulatory Visit | Attending: Cardiology | Admitting: Cardiology

## 2012-07-02 DIAGNOSIS — R0609 Other forms of dyspnea: Secondary | ICD-10-CM

## 2012-07-02 DIAGNOSIS — I359 Nonrheumatic aortic valve disorder, unspecified: Secondary | ICD-10-CM

## 2012-07-02 DIAGNOSIS — R0989 Other specified symptoms and signs involving the circulatory and respiratory systems: Secondary | ICD-10-CM

## 2012-07-02 DIAGNOSIS — R079 Chest pain, unspecified: Secondary | ICD-10-CM | POA: Insufficient documentation

## 2012-07-02 DIAGNOSIS — I712 Thoracic aortic aneurysm, without rupture, unspecified: Secondary | ICD-10-CM | POA: Insufficient documentation

## 2012-07-02 DIAGNOSIS — R0789 Other chest pain: Secondary | ICD-10-CM

## 2012-07-02 DIAGNOSIS — Z01818 Encounter for other preprocedural examination: Secondary | ICD-10-CM

## 2012-07-02 HISTORY — PX: LEFT AND RIGHT HEART CATHETERIZATION WITH CORONARY ANGIOGRAM: SHX5449

## 2012-07-02 HISTORY — PX: CARDIAC CATHETERIZATION: SHX172

## 2012-07-02 LAB — POCT I-STAT 3, ART BLOOD GAS (G3+)
Acid-base deficit: 2 mmol/L (ref 0.0–2.0)
Bicarbonate: 23.1 mEq/L (ref 20.0–24.0)
O2 Saturation: 91 %
TCO2: 24 mmol/L (ref 0–100)
pO2, Arterial: 62 mmHg — ABNORMAL LOW (ref 80.0–100.0)

## 2012-07-02 LAB — POCT I-STAT 3, VENOUS BLOOD GAS (G3P V)
Acid-base deficit: 2 mmol/L (ref 0.0–2.0)
O2 Saturation: 68 %
pO2, Ven: 38 mmHg (ref 30.0–45.0)

## 2012-07-02 SURGERY — LEFT AND RIGHT HEART CATHETERIZATION WITH CORONARY ANGIOGRAM
Anesthesia: LOCAL

## 2012-07-02 MED ORDER — MIDAZOLAM HCL 2 MG/2ML IJ SOLN
INTRAMUSCULAR | Status: AC
  Filled 2012-07-02: qty 2

## 2012-07-02 MED ORDER — ASPIRIN 81 MG PO CHEW
CHEWABLE_TABLET | ORAL | Status: AC
  Filled 2012-07-02: qty 3

## 2012-07-02 MED ORDER — SODIUM CHLORIDE 0.9 % IV SOLN: 1.0000 mL/kg/h | INTRAVENOUS | Status: DC

## 2012-07-02 MED ORDER — ACETAMINOPHEN 325 MG PO TABS: 650.0000 mg | ORAL_TABLET | ORAL | Status: DC | PRN

## 2012-07-02 MED ORDER — FENTANYL CITRATE 0.05 MG/ML IJ SOLN
INTRAMUSCULAR | Status: AC
  Filled 2012-07-02: qty 2

## 2012-07-02 MED ORDER — SODIUM CHLORIDE 0.9 % IJ SOLN: 3.0000 mL | INTRAMUSCULAR | Status: DC | PRN

## 2012-07-02 MED ORDER — ONDANSETRON HCL 4 MG/2ML IJ SOLN: 4.0000 mg | Freq: Four times a day (QID) | INTRAMUSCULAR | Status: DC | PRN

## 2012-07-02 MED ORDER — SODIUM CHLORIDE 0.9 % IJ SOLN
3.0000 mL | Freq: Two times a day (BID) | INTRAMUSCULAR | Status: DC
Start: 2012-07-02 — End: 2012-07-02

## 2012-07-02 MED ORDER — SODIUM CHLORIDE 0.9 % IV SOLN
250.0000 mL | INTRAVENOUS | Status: DC | PRN
Start: 2012-07-02 — End: 2012-07-02

## 2012-07-02 MED ORDER — DIAZEPAM 5 MG PO TABS
5.0000 mg | ORAL_TABLET | ORAL | Status: AC
  Administered 2012-07-02: 5 mg via ORAL
  Filled 2012-07-02: qty 1

## 2012-07-02 MED ORDER — SODIUM CHLORIDE 0.9 % IV SOLN
INTRAVENOUS | Status: DC
Start: 2012-07-06 — End: 2012-07-02
  Administered 2012-07-02: 10:00:00 via INTRAVENOUS

## 2012-07-02 MED ORDER — ASPIRIN 81 MG PO CHEW
324.0000 mg | CHEWABLE_TABLET | ORAL | Status: AC
  Administered 2012-07-02: 240 mg via ORAL

## 2012-07-02 NOTE — CV Procedure (Signed)
   Cardiac Catheterization Procedure Note  Name: [name] [name] MRN: [id number] DOB: 07-Feb-1946  Procedure: Right Heart Cath, Left Heart Cath, Selective Coronary Angiography, LV angiography, aortic root angiography  Indication: 67 year old white male presents with symptoms of exertional dyspnea and chest pain. Echocardiogram shows evidence of moderate to severe aortic stenosis.   Procedural Details: The right groin was prepped, draped, and anesthetized with 1% lidocaine. Using the modified Seldinger technique a 5 French sheath was placed in the right femoral artery and a 7 French sheath was placed in the right femoral vein. A Swan-Ganz catheter was used for the right heart catheterization. Standard protocol was followed for recording of right heart pressures and sampling of oxygen saturations. Fick cardiac output was calculated. Standard Judkins catheters were used for selective coronary angiography, left ventriculography, and aortic angiography. There were no immediate procedural complications. The patient was transferred to the post catheterization recovery area for further monitoring.  Procedural Findings: Hemodynamics RA 5/4 with a mean of 0 mmHg RV 23/2 mmHg PA 20/5 with a mean of 11 mmHg PCWP 8/6 with a mean of 4 mmHg LV 140/70 mmHg AO 108/67 with a mean of 84 mmHg  Peak aortic valve gradient is 28 mmHg with a mean gradient of 25 mmHg. Calculated valve area is 1.5 cm with an index of 0.7.  Oxygen saturations: PA 68% AO 91%  Cardiac Output (Fick) 6.5 L per minute  Cardiac Index (Fick) 3.1 L per minute per meter square   Coronary angiography: Coronary dominance: right  Left mainstem: Normal.  Left anterior descending (LAD): The left anterior descending artery extends to the apex. It gives rise to 2 small diagonal branches. It has mild irregularities less than 10-20%.  Left circumflex (LCx): The left circumflex gives rise to 2 marginal branches. It is normal.  Right coronary  artery (RCA): The right coronary has an anterior takeoff. It is a large dominant vessel. There is mild ectasia in the proximal vessel. Otherwise it has minor irregularities less than 10-20%.  Left ventriculography: Left ventricular systolic function is normal, LVEF is estimated at 60-65%, there is no significant mitral regurgitation. The aortic valve is heavily calcified.  The aortic root and proximal aorta are markedly dilated.  Final Conclusions:   1. Moderate to severe aortic stenosis. 2. Proximal thoracic aortic aneurysm. 3. No significant coronary artery disease. 4. Normal LV function. 5. Normal right heart pressures.  Recommendations: Recommend aortic valve replacement and aortic root grafting. We'll refer to CT surgery.   Theron Arista [facility] 07/02/2012, 11:41 AM
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
## 2012-07-02 NOTE — Interval H&P Note (Signed)
History and Physical Interval Note:  07/02/2012 10:46 AM  [name] [name]  has presented today for surgery, with the diagnosis of Chest pain  The various methods of treatment have been discussed with the patient and family. After consideration of risks, benefits and other options for treatment, the patient has consented to  Procedure(s): LEFT AND RIGHT HEART CATHETERIZATION WITH CORONARY ANGIOGRAM (N/A) as a surgical intervention .  The patient's history has been reviewed, patient examined, no change in status, stable for surgery.  I have reviewed the patient's chart and labs.  Questions were answered to the patient's satisfaction.     [name] 07/02/2012 10:46 AM

## 2012-07-03 ENCOUNTER — Telehealth: Payer: Self-pay | Admitting: Cardiology

## 2012-07-03 NOTE — Telephone Encounter (Signed)
Discuss limitations with  Dr. Patty Sermons

## 2012-07-03 NOTE — Telephone Encounter (Signed)
New Prob   Calling in to following up on how to move forward after cath procedure. Would like to speak to nurse.

## 2012-07-03 NOTE — Telephone Encounter (Signed)
Spoke with [place] and they will call him with an appointment.

## 2012-07-04 NOTE — Telephone Encounter (Signed)
Discussed with  Dr. Patty Sermons and needs avoid strenuous activity (includind working on doors), advised patient

## 2012-07-05 ENCOUNTER — Other Ambulatory Visit: Payer: Self-pay

## 2012-07-05 ENCOUNTER — Encounter: Payer: Self-pay | Admitting: Surgery

## 2012-07-05 ENCOUNTER — Institutional Professional Consult (permissible substitution) (INDEPENDENT_AMBULATORY_CARE_PROVIDER_SITE_OTHER): Payer: Medicare Other | Admitting: Surgery

## 2012-07-05 VITALS — BP 118/78 | HR 73 | Resp 16 | Ht 72.0 in | Wt 197.0 lb

## 2012-07-05 DIAGNOSIS — I712 Thoracic aortic aneurysm, without rupture, unspecified: Secondary | ICD-10-CM | POA: Insufficient documentation

## 2012-07-05 DIAGNOSIS — I350 Nonrheumatic aortic (valve) stenosis: Secondary | ICD-10-CM

## 2012-07-05 DIAGNOSIS — I359 Nonrheumatic aortic valve disorder, unspecified: Secondary | ICD-10-CM
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
## 2012-07-10 ENCOUNTER — Ambulatory Visit
Admission: RE | Admit: 2012-07-10 | Discharge: 2012-07-10 | Disposition: A | Discharge status: Home / Self Care | Payer: Medicare Other | Source: Ambulatory Visit | Attending: Surgery | Admitting: Surgery

## 2012-07-10 ENCOUNTER — Other Ambulatory Visit: Payer: Self-pay

## 2012-07-10 ENCOUNTER — Other Ambulatory Visit: Payer: Self-pay | Admitting: *Deleted

## 2012-07-10 DIAGNOSIS — I712 Thoracic aortic aneurysm, without rupture: Secondary | ICD-10-CM

## 2012-07-10 DIAGNOSIS — I359 Nonrheumatic aortic valve disorder, unspecified: Secondary | ICD-10-CM

## 2012-07-10 MED ORDER — IOHEXOL 350 MG/ML SOLN: 80.0000 mL | Freq: Once | INTRAVENOUS | Status: DC | PRN

## 2012-07-10 MED ORDER — IOHEXOL 350 MG/ML SOLN
100.0000 mL | Freq: Once | INTRAVENOUS | Status: AC | PRN
  Administered 2012-07-10: 100 mL via INTRAVENOUS

## 2012-07-11 ENCOUNTER — Encounter: Payer: Self-pay | Admitting: Cardiology

## 2012-07-22 ENCOUNTER — Encounter (HOSPITAL_COMMUNITY): Payer: Self-pay

## 2012-07-24 ENCOUNTER — Encounter: Payer: Self-pay | Admitting: Surgery

## 2012-07-24 ENCOUNTER — Ambulatory Visit (INDEPENDENT_AMBULATORY_CARE_PROVIDER_SITE_OTHER): Payer: Medicare Other | Admitting: Surgery

## 2012-07-24 ENCOUNTER — Inpatient Hospital Stay (HOSPITAL_COMMUNITY)
Admission: RE | Admit: 2012-07-24 | Discharge: 2012-07-24 | Disposition: A | Discharge status: Home / Self Care | Payer: Medicare Other | Source: Ambulatory Visit | Attending: Surgery | Admitting: Surgery

## 2012-07-24 ENCOUNTER — Encounter (HOSPITAL_COMMUNITY): Payer: Self-pay

## 2012-07-24 ENCOUNTER — Encounter (HOSPITAL_COMMUNITY)
Admission: RE | Admit: 2012-07-24 | Discharge: 2012-07-24 | Disposition: A | Discharge status: Home / Self Care | Payer: Medicare Other | Source: Ambulatory Visit | Attending: Surgery | Admitting: Surgery

## 2012-07-24 ENCOUNTER — Ambulatory Visit (HOSPITAL_COMMUNITY)
Admission: RE | Admit: 2012-07-24 | Discharge: 2012-07-24 | Disposition: A | Discharge status: Home / Self Care | Payer: Medicare Other | Source: Ambulatory Visit | Attending: Surgery | Admitting: Surgery

## 2012-07-24 VITALS — BP 117/81 | HR 76 | Resp 20 | Ht 72.0 in | Wt 194.0 lb

## 2012-07-24 VITALS — BP 129/85 | HR 68 | Temp 97.9°F | Resp 20 | Ht 72.0 in | Wt 194.5 lb

## 2012-07-24 DIAGNOSIS — Z01811 Encounter for preprocedural respiratory examination: Secondary | ICD-10-CM | POA: Insufficient documentation

## 2012-07-24 DIAGNOSIS — I712 Thoracic aortic aneurysm, without rupture, unspecified: Secondary | ICD-10-CM

## 2012-07-24 DIAGNOSIS — Z01818 Encounter for other preprocedural examination: Secondary | ICD-10-CM | POA: Insufficient documentation

## 2012-07-24 DIAGNOSIS — I359 Nonrheumatic aortic valve disorder, unspecified: Secondary | ICD-10-CM | POA: Insufficient documentation

## 2012-07-24 DIAGNOSIS — Z01810 Encounter for preprocedural cardiovascular examination: Secondary | ICD-10-CM

## 2012-07-24 DIAGNOSIS — Z01812 Encounter for preprocedural laboratory examination: Secondary | ICD-10-CM | POA: Insufficient documentation

## 2012-07-24 DIAGNOSIS — I350 Nonrheumatic aortic (valve) stenosis: Secondary | ICD-10-CM

## 2012-07-24 HISTORY — DX: Inflammatory liver disease, unspecified: K75.9

## 2012-07-24 LAB — CBC
HCT: 41.5 % (ref 39.0–52.0)
Hemoglobin: 14.6 g/dL (ref 13.0–17.0)
MCV: 90.6 fL (ref 78.0–100.0)
Platelets: 207 10*3/uL (ref 150–400)
RBC: 4.58 MIL/uL (ref 4.22–5.81)
WBC: 7.2 10*3/uL (ref 4.0–10.5)

## 2012-07-24 LAB — URINALYSIS, ROUTINE W REFLEX MICROSCOPIC
Bilirubin Urine: NEGATIVE
Glucose, UA: NEGATIVE mg/dL
Hgb urine dipstick: NEGATIVE
Ketones, ur: NEGATIVE mg/dL
Leukocytes, UA: NEGATIVE
Protein, ur: NEGATIVE mg/dL
pH: 7.5 (ref 5.0–8.0)

## 2012-07-24 LAB — ABO/RH: ABO/RH(D): A POS

## 2012-07-24 LAB — BLOOD GAS, ARTERIAL
Acid-Base Excess: 1.1 mmol/L (ref 0.0–2.0)
Bicarbonate: 25 mEq/L — ABNORMAL HIGH (ref 20.0–24.0)
TCO2: 26.2 mmol/L (ref 0–100)
pCO2 arterial: 38.7 mmHg (ref 35.0–45.0)
pO2, Arterial: 79.3 mmHg — ABNORMAL LOW (ref 80.0–100.0)

## 2012-07-24 LAB — PULMONARY FUNCTION TEST

## 2012-07-24 LAB — COMPREHENSIVE METABOLIC PANEL
AST: 20 U/L (ref 0–37)
CO2: 24 mEq/L (ref 19–32)
Chloride: 104 mEq/L (ref 96–112)
Creatinine, Ser: 0.86 mg/dL (ref 0.50–1.35)
GFR calc Af Amer: >90 mL/min (ref >90)
GFR calc non Af Amer: 87 mL/min — ABNORMAL LOW (ref >90)
Glucose, Bld: 97 mg/dL (ref 70–99)
Total Bilirubin: 0.6 mg/dL (ref 0.3–1.2)

## 2012-07-24 LAB — HEMOGLOBIN A1C: Mean Plasma Glucose: 111 mg/dL (ref <117)

## 2012-07-24 NOTE — Progress Notes (Signed)
Pre-op Cardiac Surgery  Carotid Findings:  Bilateral:  No evidence of hemodynamically significant internal carotid artery stenosis.   Vertebral artery flow is antegrade.      Upper Extremity Right Left  Brachial Pressures 129 121  Radial Waveforms Tri Tri  Ulnar Waveforms Tri Tri  Palmar Arch (Allen's Test) Normal  Normal    [name] [name], RDMS, RVT 07/24/2012

## 2012-07-24 NOTE — Pre-Procedure Instructions (Signed)
[name] [name]  07/24/2012   Your procedure is scheduled on:  Monday July 29, 2012  Report to [facility] at 0530 AM.  Call this number if you have problems the morning of surgery: [phone]   Remember:   Do not eat food or drink liquids after midnight.Sunday   Take these medicines the morning of surgery with A SIP OF WATER: Wellbutrin   Do not wear jewelry.  Do not wear lotions, or powders You may wear deodorant. Men may shave face and neck.  Do not bring valuables to the hospital.  Contacts, dentures or bridgework may not be worn into surgery.  Leave suitcase in the car. After surgery it may be brought to your room.  For patients admitted to the hospital, checkout time is 11:00 AM the day of  discharge.   Patients discharged the day of surgery will not be allowed to drive  home.    Special Instructions: Incentive Spirometry - Practice and bring it with you on the day of surgery. Shower using CHG 2 nights before surgery and the night before surgery.  If you shower the day of surgery use CHG.  Use special wash - you have one bottle of CHG for all showers.  You should use approximately 1/3 of the bottle for each shower.   Please read over the following fact sheets that you were given: Pain Booklet, Coughing and Deep Breathing, MRSA Information and Surgical Site Infection Prevention

## 2012-07-25 ENCOUNTER — Encounter: Payer: Self-pay | Admitting: Surgery

## 2012-07-25 NOTE — Progress Notes (Signed)
[address]       [name] [address]             [phone]      HPI:  [name] [name] returns today to discuss the results of his CTA of the chest, abdomen and pelvis. He is otherwise doing well. He went on vacation to [place] last week and had no problems.  Current Outpatient Prescriptions  Medication Sig Dispense Refill  . ALPRAZolam (XANAX) 1 MG tablet Take 1 mg by mouth at bedtime as needed for sleep.      [name] buPROPion (WELLBUTRIN) 100 MG tablet Take 1 tablet (100 mg total) by mouth 2 (two) times daily.  180 tablet  3  . calcium carbonate (OS-CAL) 600 MG TABS Take 600 mg by mouth daily.        [name] CINNAMON PO Take 2,000 mg by mouth daily.        . Cyanocobalamin (VITAMIN B-12 PO) Take 1 tablet by mouth daily.       . finasteride (PROSCAR) 5 MG tablet Take 1 tablet (5 mg total) by mouth daily.  90 tablet  3  . Garlic 2000 MG CAPS Take 4,000 mg by mouth daily.        . Glucosamine HCl-Glucosamin SO4 (GLUCOSAMINE COMPLEX PO) Take 4,000 mg by mouth daily.       [name] lisinopril (PRINIVIL,ZESTRIL) 40 MG tablet Take 1 tablet (40 mg total) by mouth daily.  90 tablet  4  . MAGNESIUM PO Take 1 tablet by mouth daily.       [name] OVER THE COUNTER MEDICATION Take 1 tablet by mouth daily. Tumeric tablet      . polyethylene glycol (MIRALAX / GLYCOLAX) packet Take 17 g by mouth daily as needed (for constipation).      . Red Yeast Rice Extract (RED YEAST RICE PO) Take 2 tablets by mouth daily.       . Saw Palmetto, Serenoa repens, (SAW PALMETTO PO) Take 1 capsule by mouth daily.       . simvastatin (ZOCOR) 20 MG tablet Take 1 tablet (20 mg total) by mouth at bedtime.  90 tablet  3   No current facility-administered medications for this visit.     Physical Exam: BP 117/81  Pulse 76  Resp 20  Ht 6' (1.829 m)  Wt 194 lb (87.998 kg)  BMI 26.31 kg/m2  SpO2 98% He looks well. Cardiac exam shows a regular rate and rhythm with a grade 2/6 systolic murmur. Lung exam is clear. There is  no peripheral edema.   Diagnostic Tests: *RADIOLOGY REPORT*   Clinical Data:  History of aortic stenosis and thoracic aortic aneurysm   CT ANGIOGRAPHY CHEST, ABDOMEN AND PELVIS   Technique:  Multidetector CT imaging through the chest, abdomen and pelvis was performed using the standard protocol during bolus administration of intravenous contrast.  Multiplanar reconstructed images including MIPs were obtained and reviewed to evaluate the vascular anatomy.   Contrast: , OMNIPAQUE IOHEXOL 350 MG/ML SOLN   Comparison:   None.   CTA CHEST   Vascular Findings:   There is fusiform aneurysmal dilatation of the ascending thoracic aorta with measurements as follows.  The thoracic aorta tapers to a near normal caliber at the level of the posterior aspect of the aortic arch.  The thoracic aorta is largely free of calcified or noncalcified atherosclerotic plaque.  No thoracic aortic dissection or periaortic stranding.  Conventional configuration of the aortic arch.  The visualized  portions of the major branch vessels of the aortic arch widely patent.   Maximum diameter of aneurysm: 52 mm; Location: As measured in greatest oblique coronal dimension at the level of the main pulmonary artery (image 52, series 301).   Sinotubular junction: 45 mm as measured in greatest oblique coronal dimension.   Proximal ascending aorta: 51 x 54 mm as measured in greatest oblique axial dimension at the level of the main pulmonary artery.   Aortic arch aorta: 30 mm as measured in greatest oblique sagittal dimension.   Proximal descending thoracic aorta: 31 x 31 mm as measured in greatest oblique axial dimension at the level of the main pulmonary artery.   Distal descending thoracic aorta: 28 x 28 mm as measured in greatest oblique axial dimension at the level of the diaphragmatic hiatus.   Normal heart size. There are extensive calcifications within the aortic valve leaflets.  No  pericardial effusion.  Although this examination was not tailored for evaluation of the pulmonary arteries, there are no discrete filling defects within the main, right or left pulmonary arteries to suggest central pulmonary embolism.  Normal caliber of the main pulmonary artery.   Review of the MIP images confirms the above findings.   -----------------------------------------------------------------   Nonvascular findings:   There is minimal grossly symmetric dependent subpleural ground- glass atelectasis.  No focal airspace opacities.  No discrete pulmonary nodules.  No pleural effusion or pneumothorax.  There is leftward deviation of the tracheal air column at the level of the thoracic inlet secondary to enlargement [place].  Heterogeneously appearing right lobe of the thyroid.  The inferior aspect the right lobe of the thyroid measures approximately 5.8 x 4.0 cm (image 80, series four).  The central pulmonary airways are widely patent.   No mediastinal, hilar or axillary lymphadenopathy.   No acute or aggressive osseous abnormalities within the chest.   IMPRESSION: 1.  Fusiform aneurysmal dilatation of the ascending thoracic aorta measuring approximately 52 mm in greatest oblique coronal dimension.  No evidence of thoracic aortic dissection.   2.  Extensive calcifications of the aortic valve leaflets.   3.  Enlarged right lobe of the thyroid with mass effect upon the tracheal air, the level thoracic inlet.  Further evaluation with dedicated thyroid ultrasound may be performed as clinically indicated.   CTA ABDOMEN AND PELVIS   Vascular Findings:   Abdominal aorta:  Normal caliber of the abdominal aorta.  No abdominal aortic dissection or periaortic stranding.   Celiac artery:  Widely patent.  Incidental note is made of an early bifurcation of the celiac artery with proximal takeoff of the common hepatic and splenic arteries.  The left gastric artery is incidentally  noted to arise directly from the abdominal aorta.   SMA:  There is a very minimal amount of eccentric mixed calcified and noncalcified atherosclerotic plaque involving the right lateral aspect of the origin of the SMA (image 131, series 4, not resulting in hemodynamically significant stenosis.  Conventional branching pattern.   Right renal artery:  Solitary; there is a minimal amount of eccentric calcified plaque involving the undersurface of the origin of the right renal artery, not resulting in hemodynamically significant stenosis.   Left renal artery:  Duplicated; there is a tiny accessory left sided renal artery which arises caudal and anterior to the takeoff of the dominant left sided renal artery.  Both renal arteries are widely patent without hemodynamically significant narrowing.   IMA:  Widely patent.   Pelvic vasculature:  The  bilateral common iliac arteries are noted to be mildly tortuous and slightly ectatic, each measuring approximately 1.5 cm in greatest oblique axial dimension.  The bilateral external and internal iliac arteries are widely patent, of normal caliber and free of hemodynamically significant narrowing.   Review of the MIP images confirms the above findings.   ------------------------------------------------------------------   Nonvascular findings:   Evaluation of the abdominal organs is largely limited to the arterial phase of enhancement.   Normal hepatic contour.  No discrete hyperenhancing hepatic lesions.  The gallbladder is decompressed otherwise normal.  No intra or extrahepatic biliary ductal dilatation.  No ascites.   There is symmetric enhancement and excretion of the bilateral kidneys.  There is an approximately 2.5 x 2.9 cm hypoattenuating partially exophytic cyst arising from the superior pole left kidney (image 138, series four).  There is not a approximate 1.8 cm cortical cyst within the superior pole right kidney (image  15, series seven).  An additional hypoattenuating lesion within the inferior pole of the left kidney (image 49, series 4 is too small to accurate characterize of favored to represent additional renal cyst.  No definite renal stones on the post contrast examination. No urinary obstruction or perinephric stranding.  Normal appearance of the bilateral adrenal glands, pancreas and spleen.   Moderate colonic stool without evidence of obstruction.  Colonic diverticulosis without evidence of diverticulitis.  The bowel is otherwise normal in course and caliber without wall thickening or evidence of obstruction.  No pneumoperitoneum, pneumatosis or portal venous gas.  No retroperitoneal, mesenteric, pelvic or inguinal lymphadenopathy.   The prostate is enlarged with mass effect upon the undersurface of the bladder.  No free fluid in the pelvis.   No acute or aggressive osseous abnormalities within the abdomen or pelvis.  Mild multilevel lumbar spine degenerative change, worst at L3 - L4 and L5 - S1.   IMPRESSION:   1.  Normal caliber of the abdominal aorta.  Incidentally noted congenital variant abdominal vascular anatomy as above.   2.  Bilateral renal cysts.   3.  Enlarged prostate.     Original Report Authenticated By: Tacey Ruiz, MD   Impression/Plan:  He has aneurysmal dilitation of the ascending aorta extending into the mid-portion of the aortic arch. The maximum diameter is 5.2 cm at the level of the right pulmonary artery. The aorta is still 4 cm at the level of the innominate and decreases to 3 cm at the left common carotid. The remainder of the aorta is normal caliber. I think this will require replacement to the mid-portion of the aortic arch at the level of the left common carotid. The origin of the innominate artery is 2 cm which is aneuysmal. I discussed the options of mechanical and tissue valves, their pros and cons and he would like to use a tissue valve. I think this  is reasonable at his age of 75. I discussed the operative procedure with the patient and family including alternatives, benefits and risks; including but not limited to bleeding, blood transfusion, infection, stroke, myocardial infarction, graft failure, heart block requiring a permanent pacemaker, organ dysfunction, and death.  [name] [name] understands and agrees to proceed.  We will schedule surgery for Monday, 07/29/2012.

## 2012-07-26 ENCOUNTER — Other Ambulatory Visit (HOSPITAL_COMMUNITY): Payer: Medicare Other

## 2012-07-29 ENCOUNTER — Inpatient Hospital Stay (HOSPITAL_COMMUNITY)
Admission: RE | Admit: 2012-07-29 | Discharge: 2012-08-08 | DRG: 220 | Disposition: A | Discharge status: Home / Self Care | Payer: Medicare Other | Source: Ambulatory Visit | Attending: Surgery | Admitting: Surgery

## 2012-07-29 ENCOUNTER — Encounter (HOSPITAL_COMMUNITY): Payer: Self-pay | Admitting: Anesthesiology

## 2012-07-29 ENCOUNTER — Encounter (HOSPITAL_COMMUNITY)
Admission: RE | Disposition: A | Discharge status: Home / Self Care | Payer: Self-pay | Source: Ambulatory Visit | Attending: Surgery

## 2012-07-29 ENCOUNTER — Inpatient Hospital Stay (HOSPITAL_COMMUNITY): Payer: Medicare Other | Admitting: Anesthesiology

## 2012-07-29 ENCOUNTER — Encounter (HOSPITAL_COMMUNITY): Payer: Self-pay | Admitting: *Deleted

## 2012-07-29 ENCOUNTER — Inpatient Hospital Stay (HOSPITAL_COMMUNITY): Payer: Medicare Other

## 2012-07-29 DIAGNOSIS — Y832 Surgical operation with anastomosis, bypass or graft as the cause of abnormal reaction of the patient, or of later complication, without mention of misadventure at the time of the procedure: Secondary | ICD-10-CM | POA: Diagnosis not present

## 2012-07-29 DIAGNOSIS — T82898A Other specified complication of vascular prosthetic devices, implants and grafts, initial encounter: Secondary | ICD-10-CM | POA: Diagnosis not present

## 2012-07-29 DIAGNOSIS — E8779 Other fluid overload: Secondary | ICD-10-CM | POA: Diagnosis not present

## 2012-07-29 DIAGNOSIS — I712 Thoracic aortic aneurysm, without rupture, unspecified: Secondary | ICD-10-CM

## 2012-07-29 DIAGNOSIS — Z87891 Personal history of nicotine dependence: Secondary | ICD-10-CM

## 2012-07-29 DIAGNOSIS — Z952 Presence of prosthetic heart valve: Secondary | ICD-10-CM

## 2012-07-29 DIAGNOSIS — Y9210 Unspecified residential institution as the place of occurrence of the external cause: Secondary | ICD-10-CM | POA: Diagnosis not present

## 2012-07-29 DIAGNOSIS — D696 Thrombocytopenia, unspecified: Secondary | ICD-10-CM | POA: Diagnosis not present

## 2012-07-29 DIAGNOSIS — E785 Hyperlipidemia, unspecified: Secondary | ICD-10-CM | POA: Diagnosis present

## 2012-07-29 DIAGNOSIS — I359 Nonrheumatic aortic valve disorder, unspecified: Principal | ICD-10-CM

## 2012-07-29 DIAGNOSIS — I959 Hypotension, unspecified: Secondary | ICD-10-CM | POA: Diagnosis not present

## 2012-07-29 DIAGNOSIS — D62 Acute posthemorrhagic anemia: Secondary | ICD-10-CM | POA: Diagnosis not present

## 2012-07-29 DIAGNOSIS — I10 Essential (primary) hypertension: Secondary | ICD-10-CM | POA: Diagnosis present

## 2012-07-29 DIAGNOSIS — I350 Nonrheumatic aortic (valve) stenosis: Secondary | ICD-10-CM

## 2012-07-29 HISTORY — PX: BENTALL PROCEDURE: SHX5058

## 2012-07-29 HISTORY — PX: INTRAOPERATIVE TRANSESOPHAGEAL ECHOCARDIOGRAM: SHX5062

## 2012-07-29 LAB — POCT I-STAT 4, (NA,K, GLUC, HGB,HCT)
Glucose, Bld: 101 mg/dL — ABNORMAL HIGH (ref 70–99)
Glucose, Bld: 137 mg/dL — ABNORMAL HIGH (ref 70–99)
Glucose, Bld: 97 mg/dL (ref 70–99)
HCT: 33 % — ABNORMAL LOW (ref 39.0–52.0)
HCT: 30 % — ABNORMAL LOW (ref 39.0–52.0)
HCT: 29 % — ABNORMAL LOW (ref 39.0–52.0)
Hemoglobin: 11.2 g/dL — ABNORMAL LOW (ref 13.0–17.0)
Hemoglobin: 9.5 g/dL — ABNORMAL LOW (ref 13.0–17.0)
Hemoglobin: 10.2 g/dL — ABNORMAL LOW (ref 13.0–17.0)
Hemoglobin: 9.9 g/dL — ABNORMAL LOW (ref 13.0–17.0)
Potassium: 4 mEq/L (ref 3.5–5.1)
Potassium: 4.5 mEq/L (ref 3.5–5.1)
Potassium: 3.4 mEq/L — ABNORMAL LOW (ref 3.5–5.1)
Sodium: 143 mEq/L (ref 135–145)
Sodium: 139 mEq/L (ref 135–145)
Sodium: 143 mEq/L (ref 135–145)
Sodium: 144 mEq/L (ref 135–145)

## 2012-07-29 LAB — CREATININE, SERUM
GFR calc Af Amer: >90 mL/min (ref >90)
GFR calc non Af Amer: 83 mL/min — ABNORMAL LOW (ref >90)

## 2012-07-29 LAB — POCT I-STAT 3, ART BLOOD GAS (G3+)
Acid-base deficit: 1 mmol/L (ref 0.0–2.0)
Acid-base deficit: 4 mmol/L — ABNORMAL HIGH (ref 0.0–2.0)
Acid-base deficit: 4 mmol/L — ABNORMAL HIGH (ref 0.0–2.0)
Bicarbonate: 25.3 mEq/L — ABNORMAL HIGH (ref 20.0–24.0)
Bicarbonate: 24.4 mEq/L — ABNORMAL HIGH (ref 20.0–24.0)
Bicarbonate: 22.1 mEq/L (ref 20.0–24.0)
Bicarbonate: 22.8 mEq/L (ref 20.0–24.0)
O2 Saturation: 100 %
O2 Saturation: 97 %
O2 Saturation: 97 %
O2 Saturation: 98 %
Patient temperature: 36.6
Patient temperature: 36.4
TCO2: 27 mmol/L (ref 0–100)
TCO2: 26 mmol/L (ref 0–100)
TCO2: 24 mmol/L (ref 0–100)
pCO2 arterial: 46.9 mmHg — ABNORMAL HIGH (ref 35.0–45.0)
pCO2 arterial: 63 mmHg (ref 35.0–45.0)
pCO2 arterial: 35.6 mmHg (ref 35.0–45.0)
pH, Arterial: 7.196 — CL (ref 7.350–7.450)
pH, Arterial: 7.348 — ABNORMAL LOW (ref 7.350–7.450)
pO2, Arterial: 339 mmHg — ABNORMAL HIGH (ref 80.0–100.0)
pO2, Arterial: 151 mmHg — ABNORMAL HIGH (ref 80.0–100.0)
pO2, Arterial: 105 mmHg — ABNORMAL HIGH (ref 80.0–100.0)

## 2012-07-29 LAB — CBC
HCT: 29.7 % — ABNORMAL LOW (ref 39.0–52.0)
HCT: 29.6 % — ABNORMAL LOW (ref 39.0–52.0)
Hemoglobin: 10.8 g/dL — ABNORMAL LOW (ref 13.0–17.0)
Hemoglobin: 10.7 g/dL — ABNORMAL LOW (ref 13.0–17.0)
MCV: 88.9 fL (ref 78.0–100.0)
RBC: 3.32 MIL/uL — ABNORMAL LOW (ref 4.22–5.81)
RBC: 3.33 MIL/uL — ABNORMAL LOW (ref 4.22–5.81)
RDW: 13 % (ref 11.5–15.5)
WBC: 9.8 10*3/uL (ref 4.0–10.5)
WBC: 14 10*3/uL — ABNORMAL HIGH (ref 4.0–10.5)

## 2012-07-29 LAB — GLUCOSE, CAPILLARY
Glucose-Capillary: 108 mg/dL — ABNORMAL HIGH (ref 70–99)
Glucose-Capillary: 110 mg/dL — ABNORMAL HIGH (ref 70–99)
Glucose-Capillary: 115 mg/dL — ABNORMAL HIGH (ref 70–99)
Glucose-Capillary: 118 mg/dL — ABNORMAL HIGH (ref 70–99)
Glucose-Capillary: 120 mg/dL — ABNORMAL HIGH (ref 70–99)

## 2012-07-29 LAB — POCT I-STAT, CHEM 8
Calcium, Ion: 1.17 mmol/L (ref 1.13–1.30)
Creatinine, Ser: 1 mg/dL (ref 0.50–1.35)
Hemoglobin: 9.9 g/dL — ABNORMAL LOW (ref 13.0–17.0)
Sodium: 144 mEq/L (ref 135–145)
TCO2: 22 mmol/L (ref 0–100)

## 2012-07-29 LAB — PROTIME-INR: INR: 1.42 (ref 0.00–1.49)

## 2012-07-29 LAB — APTT: aPTT: 44 seconds — ABNORMAL HIGH (ref 24–37)

## 2012-07-29 SURGERY — BENTALL PROCEDURE
Anesthesia: General | Site: Chest | Wound class: Clean

## 2012-07-29 MED ORDER — METOPROLOL TARTRATE 25 MG/10 ML ORAL SUSPENSION
12.5000 mg | Freq: Two times a day (BID) | ORAL | Status: DC
  Filled 2012-07-29 (×3): qty 5

## 2012-07-29 MED ORDER — VECURONIUM BROMIDE 10 MG IV SOLR
INTRAVENOUS | Status: DC | PRN
  Administered 2012-07-29: 10 mg via INTRAVENOUS

## 2012-07-29 MED ORDER — ACETAMINOPHEN 500 MG PO TABS
1000.0000 mg | ORAL_TABLET | Freq: Four times a day (QID) | ORAL | Status: DC
  Administered 2012-07-30 – 2012-07-31 (×6): 1000 mg via ORAL
  Filled 2012-07-29 (×9): qty 2

## 2012-07-29 MED ORDER — DEXTROSE 5 % IV SOLN
1.5000 g | Freq: Two times a day (BID) | INTRAVENOUS | Status: AC
  Administered 2012-07-30 – 2012-07-31 (×4): 1.5 g via INTRAVENOUS
  Filled 2012-07-29 (×4): qty 1.5

## 2012-07-29 MED ORDER — POTASSIUM CHLORIDE 10 MEQ/50ML IV SOLN
10.0000 meq | INTRAVENOUS | Status: AC
  Administered 2012-07-29 (×2): 10 meq via INTRAVENOUS

## 2012-07-29 MED ORDER — METHYLPREDNISOLONE SODIUM SUCC 125 MG IJ SOLR
INTRAMUSCULAR | Status: DC | PRN
  Administered 2012-07-29: 125 mg via INTRAVENOUS

## 2012-07-29 MED ORDER — LACTATED RINGERS IV SOLN: 500.0000 mL | Freq: Once | INTRAVENOUS | Status: AC | PRN

## 2012-07-29 MED ORDER — DEXMEDETOMIDINE HCL IN NACL 200 MCG/50ML IV SOLN: 0.1000 ug/kg/h | INTRAVENOUS | Status: DC

## 2012-07-29 MED ORDER — DEXMEDETOMIDINE HCL IN NACL 400 MCG/100ML IV SOLN
0.1000 ug/kg/h | INTRAVENOUS | Status: DC
  Filled 2012-07-29: qty 100

## 2012-07-29 MED ORDER — ACETAMINOPHEN 160 MG/5ML PO SOLN: 975.0000 mg | Freq: Four times a day (QID) | ORAL | Status: DC

## 2012-07-29 MED ORDER — THROMBIN 20000 UNITS EX SOLR
CUTANEOUS | Status: AC
  Filled 2012-07-29: qty 20000

## 2012-07-29 MED ORDER — AMIODARONE LOAD VIA INFUSION
150.0000 mg | Freq: Once | INTRAVENOUS | Status: AC
  Administered 2012-07-29: 150 mg via INTRAVENOUS

## 2012-07-29 MED ORDER — SODIUM CHLORIDE 0.9 % IV SOLN
INTRAVENOUS | Status: AC
  Administered 2012-07-29: 1 [IU]/h via INTRAVENOUS

## 2012-07-29 MED ORDER — VANCOMYCIN HCL 10 G IV SOLR
1250.0000 mg | INTRAVENOUS | Status: AC
  Administered 2012-07-29: 1250 mg via INTRAVENOUS

## 2012-07-29 MED ORDER — MIDAZOLAM HCL 5 MG/5ML IJ SOLN
INTRAMUSCULAR | Status: DC | PRN
  Administered 2012-07-29 (×2): 2 mg via INTRAVENOUS
  Administered 2012-07-29 (×2): 3 mg via INTRAVENOUS

## 2012-07-29 MED ORDER — THROMBIN 20000 UNITS EX SOLR
OROMUCOSAL | Status: DC | PRN
  Administered 2012-07-29: 09:00:00 via TOPICAL

## 2012-07-29 MED ORDER — OXYCODONE HCL 5 MG PO TABS
5.0000 mg | ORAL_TABLET | ORAL | Status: DC | PRN
  Administered 2012-07-29: 10 mg via ORAL
  Administered 2012-07-30: 5 mg via ORAL
  Administered 2012-07-30 – 2012-07-31 (×3): 10 mg via ORAL
  Filled 2012-07-29: qty 1
  Filled 2012-07-29 (×5): qty 2

## 2012-07-29 MED ORDER — SIMVASTATIN 20 MG PO TABS
20.0000 mg | ORAL_TABLET | Freq: Every day | ORAL | Status: DC
  Administered 2012-07-30 – 2012-08-07 (×8): 20 mg via ORAL
  Filled 2012-07-29 (×10): qty 1

## 2012-07-29 MED ORDER — DEXMEDETOMIDINE HCL IN NACL 400 MCG/100ML IV SOLN
0.1000 ug/kg/h | INTRAVENOUS | Status: AC
  Administered 2012-07-29: 0.2 ug/kg/h via INTRAVENOUS

## 2012-07-29 MED ORDER — INSULIN REGULAR BOLUS VIA INFUSION
0.0000 [IU] | Freq: Three times a day (TID) | INTRAVENOUS | Status: DC
  Filled 2012-07-29: qty 10

## 2012-07-29 MED ORDER — SODIUM CHLORIDE 0.9 % IV SOLN
INTRAVENOUS | Status: DC
  Filled 2012-07-29: qty 1

## 2012-07-29 MED ORDER — SODIUM CHLORIDE 0.9 % IJ SOLN
3.0000 mL | Freq: Two times a day (BID) | INTRAMUSCULAR | Status: DC
  Administered 2012-07-30 – 2012-07-31 (×2): 3 mL via INTRAVENOUS

## 2012-07-29 MED ORDER — SODIUM CHLORIDE 0.9 % IV SOLN
INTRAVENOUS | Status: DC | PRN
  Administered 2012-07-29: 15:00:00 via INTRAVENOUS

## 2012-07-29 MED ORDER — VANCOMYCIN HCL IN DEXTROSE 1-5 GM/200ML-% IV SOLN
1000.0000 mg | Freq: Once | INTRAVENOUS | Status: AC
  Administered 2012-07-29: 1000 mg via INTRAVENOUS
  Filled 2012-07-29: qty 200

## 2012-07-29 MED ORDER — FENTANYL CITRATE 0.05 MG/ML IJ SOLN
INTRAMUSCULAR | Status: DC | PRN
  Administered 2012-07-29: 100 ug via INTRAVENOUS
  Administered 2012-07-29: 150 ug via INTRAVENOUS
  Administered 2012-07-29: 100 ug via INTRAVENOUS
  Administered 2012-07-29: 1150 ug via INTRAVENOUS

## 2012-07-29 MED ORDER — MAGNESIUM SULFATE 50 % IJ SOLN
40.0000 meq | INTRAMUSCULAR | Status: DC
  Filled 2012-07-29: qty 10

## 2012-07-29 MED ORDER — BISACODYL 5 MG PO TBEC
10.0000 mg | DELAYED_RELEASE_TABLET | Freq: Every day | ORAL | Status: DC
  Administered 2012-07-30 – 2012-07-31 (×2): 10 mg via ORAL
  Filled 2012-07-29: qty 2

## 2012-07-29 MED ORDER — DOPAMINE-DEXTROSE 3.2-5 MG/ML-% IV SOLN
2.0000 ug/kg/min | INTRAVENOUS | Status: AC
  Administered 2012-07-29: 3 ug/kg/min via INTRAVENOUS

## 2012-07-29 MED ORDER — EPINEPHRINE HCL 1 MG/ML IJ SOLN: 0.5000 ug/min | INTRAVENOUS | Status: DC

## 2012-07-29 MED ORDER — CHLORHEXIDINE GLUCONATE 4 % EX LIQD: 30.0000 mL | CUTANEOUS | Status: DC

## 2012-07-29 MED ORDER — SODIUM CHLORIDE 0.9 % IV SOLN
1.0000 g/h | INTRAVENOUS | Status: DC
  Filled 2012-07-29: qty 20

## 2012-07-29 MED ORDER — NITROGLYCERIN IN D5W 200-5 MCG/ML-% IV SOLN: 0.0000 ug/min | INTRAVENOUS | Status: DC

## 2012-07-29 MED ORDER — ARTIFICIAL TEARS OP OINT
TOPICAL_OINTMENT | OPHTHALMIC | Status: DC | PRN
  Administered 2012-07-29: 1 via OPHTHALMIC

## 2012-07-29 MED ORDER — METOPROLOL TARTRATE 12.5 MG HALF TABLET
12.5000 mg | ORAL_TABLET | Freq: Two times a day (BID) | ORAL | Status: DC
  Filled 2012-07-29 (×3): qty 1

## 2012-07-29 MED ORDER — HEMOSTATIC AGENTS (NO CHARGE) OPTIME
TOPICAL | Status: DC | PRN
  Administered 2012-07-29: 1
  Administered 2012-07-29: 1 via TOPICAL

## 2012-07-29 MED ORDER — ROCURONIUM BROMIDE 100 MG/10ML IV SOLN
INTRAVENOUS | Status: DC | PRN
  Administered 2012-07-29 (×6): 50 mg via INTRAVENOUS

## 2012-07-29 MED ORDER — AMIODARONE HCL IN DEXTROSE 360-4.14 MG/200ML-% IV SOLN
60.0000 mg/h | INTRAVENOUS | Status: AC
  Administered 2012-07-29 (×2): 60 mg/h via INTRAVENOUS
  Filled 2012-07-29: qty 200

## 2012-07-29 MED ORDER — ASPIRIN 81 MG PO CHEW: 324.0000 mg | CHEWABLE_TABLET | Freq: Every day | ORAL | Status: DC

## 2012-07-29 MED ORDER — AMIODARONE HCL IN DEXTROSE 360-4.14 MG/200ML-% IV SOLN
30.0000 mg/h | INTRAVENOUS | Status: DC
  Administered 2012-07-30 – 2012-07-31 (×2): 30 mg/h via INTRAVENOUS
  Filled 2012-07-29 (×7): qty 200

## 2012-07-29 MED ORDER — SODIUM CHLORIDE 0.9 % IV SOLN: INTRAVENOUS | Status: DC

## 2012-07-29 MED ORDER — NITROGLYCERIN IN D5W 200-5 MCG/ML-% IV SOLN: 2.0000 ug/min | INTRAVENOUS | Status: DC

## 2012-07-29 MED ORDER — METOPROLOL TARTRATE 1 MG/ML IV SOLN: 2.5000 mg | INTRAVENOUS | Status: DC | PRN

## 2012-07-29 MED ORDER — DEXTROSE 5 % IV SOLN
750.0000 mg | INTRAVENOUS | Status: DC
  Filled 2012-07-29: qty 750

## 2012-07-29 MED ORDER — SODIUM CHLORIDE 0.9 % IJ SOLN: 3.0000 mL | INTRAMUSCULAR | Status: DC | PRN

## 2012-07-29 MED ORDER — PHENYLEPHRINE HCL 10 MG/ML IJ SOLN
0.0000 ug/min | INTRAVENOUS | Status: DC
  Filled 2012-07-29: qty 2

## 2012-07-29 MED ORDER — BUPROPION HCL 100 MG PO TABS
100.0000 mg | ORAL_TABLET | Freq: Two times a day (BID) | ORAL | Status: DC
  Administered 2012-07-30 – 2012-08-08 (×15): 100 mg via ORAL
  Filled 2012-07-29 (×21): qty 1

## 2012-07-29 MED ORDER — LACTATED RINGERS IV SOLN
INTRAVENOUS | Status: DC | PRN
  Administered 2012-07-29 (×2): via INTRAVENOUS

## 2012-07-29 MED ORDER — PHENYLEPHRINE HCL 10 MG/ML IJ SOLN
30.0000 ug/min | INTRAVENOUS | Status: AC
  Administered 2012-07-29: 10 ug/min via INTRAVENOUS

## 2012-07-29 MED ORDER — THROMBIN 20000 UNITS EX SOLR
CUTANEOUS | Status: DC | PRN
  Administered 2012-07-29: 20000 [IU] via TOPICAL

## 2012-07-29 MED ORDER — ALBUMIN HUMAN 5 % IV SOLN
250.0000 mL | INTRAVENOUS | Status: AC | PRN
  Administered 2012-07-29 (×2): 250 mL via INTRAVENOUS

## 2012-07-29 MED ORDER — LACTATED RINGERS IV SOLN: INTRAVENOUS | Status: DC

## 2012-07-29 MED ORDER — SODIUM CHLORIDE 0.9 % IV SOLN: 250.0000 mL | INTRAVENOUS | Status: DC

## 2012-07-29 MED ORDER — MORPHINE SULFATE 2 MG/ML IJ SOLN: 1.0000 mg | INTRAMUSCULAR | Status: AC | PRN

## 2012-07-29 MED ORDER — DEXTROSE 5 % IV SOLN
1.5000 g | INTRAVENOUS | Status: AC
  Administered 2012-07-29: 1.5 g via INTRAVENOUS
  Administered 2012-07-29: .75 g via INTRAVENOUS

## 2012-07-29 MED ORDER — 0.9 % SODIUM CHLORIDE (POUR BTL) OPTIME
TOPICAL | Status: DC | PRN
  Administered 2012-07-29: 6000 mL

## 2012-07-29 MED ORDER — POTASSIUM CHLORIDE 2 MEQ/ML IV SOLN
80.0000 meq | INTRAVENOUS | Status: DC
  Filled 2012-07-29: qty 40

## 2012-07-29 MED ORDER — PLASMA-LYTE 148 IV SOLN
INTRAVENOUS | Status: DC
  Filled 2012-07-29: qty 2.5

## 2012-07-29 MED ORDER — PROTAMINE SULFATE 10 MG/ML IV SOLN
INTRAVENOUS | Status: DC | PRN
  Administered 2012-07-29: 350 mg via INTRAVENOUS

## 2012-07-29 MED ORDER — METOPROLOL TARTRATE 12.5 MG HALF TABLET
12.5000 mg | ORAL_TABLET | Freq: Once | ORAL | Status: AC
  Administered 2012-07-29: 12.5 mg via ORAL
  Filled 2012-07-29: qty 1

## 2012-07-29 MED ORDER — POTASSIUM CHLORIDE 10 MEQ/50ML IV SOLN
10.0000 meq | INTRAVENOUS | Status: AC
  Administered 2012-07-29 (×3): 10 meq via INTRAVENOUS

## 2012-07-29 MED ORDER — ONDANSETRON HCL 4 MG/2ML IJ SOLN
4.0000 mg | Freq: Four times a day (QID) | INTRAMUSCULAR | Status: DC | PRN
  Administered 2012-07-29 – 2012-07-30 (×2): 4 mg via INTRAVENOUS
  Filled 2012-07-29 (×2): qty 2

## 2012-07-29 MED ORDER — ACETAMINOPHEN 10 MG/ML IV SOLN
1000.0000 mg | Freq: Once | INTRAVENOUS | Status: AC
  Administered 2012-07-29: 1000 mg via INTRAVENOUS
  Filled 2012-07-29: qty 100

## 2012-07-29 MED ORDER — DOCUSATE SODIUM 100 MG PO CAPS
200.0000 mg | ORAL_CAPSULE | Freq: Every day | ORAL | Status: DC
  Administered 2012-07-30 – 2012-07-31 (×2): 200 mg via ORAL
  Filled 2012-07-29: qty 2

## 2012-07-29 MED ORDER — BISACODYL 10 MG RE SUPP: 10.0000 mg | Freq: Every day | RECTAL | Status: DC

## 2012-07-29 MED ORDER — FAMOTIDINE IN NACL 20-0.9 MG/50ML-% IV SOLN
20.0000 mg | Freq: Two times a day (BID) | INTRAVENOUS | Status: AC
  Administered 2012-07-29: 20 mg via INTRAVENOUS

## 2012-07-29 MED ORDER — MORPHINE SULFATE 2 MG/ML IJ SOLN
2.0000 mg | INTRAMUSCULAR | Status: DC | PRN
  Administered 2012-07-29 – 2012-07-30 (×2): 2 mg via INTRAVENOUS
  Administered 2012-07-30: 4 mg via INTRAVENOUS
  Administered 2012-07-30: 2 mg via INTRAVENOUS
  Administered 2012-07-30: 4 mg via INTRAVENOUS
  Administered 2012-07-30: 2 mg via INTRAVENOUS
  Administered 2012-07-31 (×2): 4 mg via INTRAVENOUS
  Filled 2012-07-29 (×3): qty 2
  Filled 2012-07-29: qty 1
  Filled 2012-07-29 (×2): qty 2
  Filled 2012-07-29 (×2): qty 1

## 2012-07-29 MED ORDER — PANTOPRAZOLE SODIUM 40 MG PO TBEC
40.0000 mg | DELAYED_RELEASE_TABLET | Freq: Every day | ORAL | Status: DC
  Administered 2012-07-31: 40 mg via ORAL
  Filled 2012-07-29: qty 1

## 2012-07-29 MED ORDER — SODIUM CHLORIDE 0.45 % IV SOLN: INTRAVENOUS | Status: DC

## 2012-07-29 MED ORDER — MAGNESIUM SULFATE 40 MG/ML IJ SOLN
4.0000 g | Freq: Once | INTRAMUSCULAR | Status: AC
  Administered 2012-07-29: 4 g via INTRAVENOUS
  Filled 2012-07-29: qty 100

## 2012-07-29 MED ORDER — ASPIRIN EC 325 MG PO TBEC
325.0000 mg | DELAYED_RELEASE_TABLET | Freq: Every day | ORAL | Status: DC
  Administered 2012-07-30 – 2012-07-31 (×2): 325 mg via ORAL
  Filled 2012-07-29 (×2): qty 1

## 2012-07-29 MED ORDER — MIDAZOLAM HCL 2 MG/2ML IJ SOLN: 2.0000 mg | INTRAMUSCULAR | Status: DC | PRN

## 2012-07-29 MED ORDER — FINASTERIDE 5 MG PO TABS
5.0000 mg | ORAL_TABLET | Freq: Every day | ORAL | Status: DC
  Administered 2012-07-30 – 2012-08-08 (×9): 5 mg via ORAL
  Filled 2012-07-29 (×10): qty 1

## 2012-07-29 MED ORDER — ETOMIDATE 2 MG/ML IV SOLN
INTRAVENOUS | Status: DC | PRN
  Administered 2012-07-29: 20 mg via INTRAVENOUS

## 2012-07-29 MED ORDER — HEPARIN SODIUM (PORCINE) 1000 UNIT/ML IJ SOLN
INTRAMUSCULAR | Status: DC | PRN
  Administered 2012-07-29: 33000 [IU] via INTRAVENOUS

## 2012-07-29 MED ORDER — PROPOFOL 10 MG/ML IV BOLUS
INTRAVENOUS | Status: DC | PRN
  Administered 2012-07-29: 50 mg via INTRAVENOUS

## 2012-07-29 MED ORDER — SODIUM CHLORIDE 0.9 % IV SOLN
INTRAVENOUS | Status: AC
  Administered 2012-07-29: 70 mL/h via INTRAVENOUS
  Filled 2012-07-29: qty 40

## 2012-07-29 SURGICAL SUPPLY — 99 items
ADAPTER CARDIO PERF ANTE/RETRO (ADAPTER) ×2 IMPLANT
ANTEGRADE CPLG (MISCELLANEOUS) ×2 IMPLANT
APPLICATOR TIP COSEAL (VASCULAR PRODUCTS) ×12 IMPLANT
ATTRACTOMAT 16X20 MAGNETIC DRP (DRAPES) ×2 IMPLANT
BAG DECANTER FOR FLEXI CONT (MISCELLANEOUS) ×2 IMPLANT
BAG URO CATCHER STRL LF (DRAPE) ×2 IMPLANT
BLADE STERNUM SYSTEM 6 (BLADE) ×2 IMPLANT
BLADE SURG 15 STRL LF DISP TIS (BLADE) ×2 IMPLANT
BLADE SURG 15 STRL SS (BLADE) ×2
CANISTER SUCTION 2500CC (MISCELLANEOUS) ×2 IMPLANT
CANNULA GUNDRY RCSP 15FR (MISCELLANEOUS) ×2 IMPLANT
CANNULA VENOUS LOW PROF 34X46 (CANNULA) ×2 IMPLANT
CATH HEART VENT LEFT (CATHETERS) ×1 IMPLANT
CATH ROBINSON RED A/P 18FR (CATHETERS) ×6 IMPLANT
CATH THORACIC 36FR (CATHETERS) ×2 IMPLANT
CATH THORACIC 36FR RT ANG (CATHETERS) ×2 IMPLANT
CAUTERY HIGH TEMP VAS (MISCELLANEOUS) ×2 IMPLANT
CAUTERY SURG HI TEMP FINE TIP (MISCELLANEOUS) ×2 IMPLANT
CLOTH BEACON ORANGE TIMEOUT ST (SAFETY) ×2 IMPLANT
CONN ST 1/4X3/8  BEN (MISCELLANEOUS) ×1
CONN ST 1/4X3/8 BEN (MISCELLANEOUS) ×1 IMPLANT
CONT SPEC 4OZ CLIKSEAL STRL BL (MISCELLANEOUS) ×4 IMPLANT
CONT SPEC STER OR (MISCELLANEOUS) ×2 IMPLANT
COVER SURGICAL LIGHT HANDLE (MISCELLANEOUS) ×4 IMPLANT
CRADLE DONUT ADULT HEAD (MISCELLANEOUS) ×2 IMPLANT
DRAPE SLUSH MACHINE 52X66 (DRAPES) IMPLANT
DRAPE SLUSH/WARMER DISC (DRAPES) ×2 IMPLANT
DRAPE SURG IRRIG POUCH 19X23 (DRAPES) ×2 IMPLANT
DRSG COVADERM 4X14 (GAUZE/BANDAGES/DRESSINGS) ×2 IMPLANT
ELECT CAUTERY BLADE 6.4 (BLADE) ×2 IMPLANT
ELECT REM PT RETURN 9FT ADLT (ELECTROSURGICAL) ×4
ELECTRODE REM PT RTRN 9FT ADLT (ELECTROSURGICAL) ×2 IMPLANT
GELWEAVE GELATIN IMPREGNATED WOVEN VASCULAR PROSTHESIS ×2 IMPLANT
GLOVE BIO SURGEON STRL SZ 6 (GLOVE) IMPLANT
GLOVE BIO SURGEON STRL SZ 6.5 (GLOVE) ×12 IMPLANT
GLOVE BIO SURGEON STRL SZ7 (GLOVE) IMPLANT
GLOVE BIO SURGEON STRL SZ7.5 (GLOVE) IMPLANT
GLOVE BIOGEL PI IND STRL 6.5 (GLOVE) ×2 IMPLANT
GLOVE BIOGEL PI IND STRL 7.0 (GLOVE) ×2 IMPLANT
GLOVE BIOGEL PI INDICATOR 6.5 (GLOVE) ×2
GLOVE BIOGEL PI INDICATOR 7.0 (GLOVE) ×2
GLOVE EUDERMIC 7 POWDERFREE (GLOVE) ×4 IMPLANT
GOWN PREVENTION PLUS XLARGE (GOWN DISPOSABLE) ×2 IMPLANT
GOWN STRL NON-REIN LRG LVL3 (GOWN DISPOSABLE) ×12 IMPLANT
GRAFT 4 BRANCH 30X50 (Prosthesis & Implant Heart) ×2 IMPLANT
GRAFT GELWEAVE VALSALVA 28CM (Prosthesis & Implant Heart) ×2 IMPLANT
HEART VENT LT CURVED (MISCELLANEOUS) ×2 IMPLANT
HEMOSTAT POWDER SURGIFOAM 1G (HEMOSTASIS) ×6 IMPLANT
HEMOSTAT SURGICEL 2X14 (HEMOSTASIS) ×2 IMPLANT
INSERT FOGARTY XLG (MISCELLANEOUS) ×2 IMPLANT
KIT BASIN OR (CUSTOM PROCEDURE TRAY) ×2 IMPLANT
KIT CATH CPB BARTLE (MISCELLANEOUS) IMPLANT
KIT ROOM TURNOVER OR (KITS) ×2 IMPLANT
KIT SUCTION CATH 14FR (SUCTIONS) ×2 IMPLANT
LINE VENT (MISCELLANEOUS) ×2 IMPLANT
LOOP VESSEL MAXI BLUE (MISCELLANEOUS) ×4 IMPLANT
NS IRRIG 1000ML POUR BTL (IV SOLUTION) ×12 IMPLANT
PACK OPEN HEART (CUSTOM PROCEDURE TRAY) ×2 IMPLANT
PAD ARMBOARD 7.5X6 YLW CONV (MISCELLANEOUS) ×4 IMPLANT
SEALANT SURG COSEAL 8ML (VASCULAR PRODUCTS) ×6 IMPLANT
SET CARDIOPLEGIA MPS 5001102 (MISCELLANEOUS) ×2 IMPLANT
SPONGE GAUZE 4X4 12PLY (GAUZE/BANDAGES/DRESSINGS) ×4 IMPLANT
SPONGE LAP 18X18 X RAY DECT (DISPOSABLE) ×2 IMPLANT
SUCKER INTRACARDIAC WEIGHTED (SUCKER) ×2 IMPLANT
SUT BONE WAX W31G (SUTURE) ×2 IMPLANT
SUT ETHIBON 2 0 V 52N 30 (SUTURE) ×4 IMPLANT
SUT ETHIBON EXCEL 2-0 V-5 (SUTURE) IMPLANT
SUT ETHIBOND V-5 VALVE (SUTURE) IMPLANT
SUT PROLENE 3 0 SH 1 (SUTURE) ×2 IMPLANT
SUT PROLENE 3 0 SH DA (SUTURE) ×8 IMPLANT
SUT PROLENE 3 0 SH1 36 (SUTURE) ×2 IMPLANT
SUT PROLENE 4 0 RB 1 (SUTURE) ×12
SUT PROLENE 4-0 RB1 .5 CRCL 36 (SUTURE) ×12 IMPLANT
SUT PROLENE 5 0 C 1 36 (SUTURE) ×8 IMPLANT
SUT PROLENE 5 0 RB 1 DA (SUTURE) ×2 IMPLANT
SUT PROLENE 5 0 RB 2 (SUTURE) ×10 IMPLANT
SUT SILK 2 0 SH CR/8 (SUTURE) ×2 IMPLANT
SUT STEEL 6MS V (SUTURE) IMPLANT
SUT STEEL STERNAL CCS#1 18IN (SUTURE) ×6 IMPLANT
SUT STEEL SZ 6 DBL 3X14 BALL (SUTURE) IMPLANT
SUT VIC AB 1 CTX 27 (SUTURE) ×2 IMPLANT
SUT VIC AB 1 CTX 36 (SUTURE) ×4
SUT VIC AB 1 CTX36XBRD ANBCTR (SUTURE) ×4 IMPLANT
SUT VIC AB 2-0 CT1 27 (SUTURE) ×1
SUT VIC AB 2-0 CT1 TAPERPNT 27 (SUTURE) ×1 IMPLANT
SUT VIC AB 3-0 SH 27 (SUTURE) ×1
SUT VIC AB 3-0 SH 27X BRD (SUTURE) ×1 IMPLANT
SUT VIC AB 3-0 X1 27 (SUTURE) IMPLANT
SUTURE E-PAK OPEN HEART (SUTURE) ×2 IMPLANT
SYSTEM SAHARA CHEST DRAIN ATS (WOUND CARE) ×2 IMPLANT
TAPE CLOTH SURG 4X10 WHT LF (GAUZE/BANDAGES/DRESSINGS) ×4 IMPLANT
TOWEL OR 17X24 6PK STRL BLUE (TOWEL DISPOSABLE) ×2 IMPLANT
TOWEL OR 17X26 10 PK STRL BLUE (TOWEL DISPOSABLE) ×2 IMPLANT
TRAY FOLEY IC TEMP SENS 14FR (CATHETERS) ×2 IMPLANT
TUBE SUCT INTRACARD DLP 20F (MISCELLANEOUS) ×2 IMPLANT
UNDERPAD 30X30 INCONTINENT (UNDERPADS AND DIAPERS) ×2 IMPLANT
VALVE CARP ED PERICARD (Prosthesis & Implant Heart) ×2 IMPLANT
VENT LEFT HEART 12002 (CATHETERS) ×2
WATER STERILE IRR 1000ML POUR (IV SOLUTION) ×4 IMPLANT

## 2012-07-29 NOTE — Brief Op Note (Addendum)
   [address]       [name] [address]             [phone]   07/29/2012  2:00 PM  PATIENT:  [name] [name]  67 y.o. male  PRE-OPERATIVE DIAGNOSIS:  Thoracic Aneurysm, Aortic Stenosis  POST-OPERATIVE DIAGNOSIS:  Thoracic Aneurysm, Aortic Stenosis  PROCEDURE:  Procedure(s): 1. Right axillary artery end to side 8mm hemashield graft anastomosis for arterial inflow. 2. Median sternotomy 3. Extracorporeal circulation 4. Aortic arch replacement under deep hypothermic circulatory arrest using a 30 mm Hemashield graft with 10 x 8 x 8 x 10 mm side grafts. 5. Left common carotid artery bypass 6. Innominate artery bypass 7. Bentall procedure using a composite 28mm Gelweave Valsalva Graft and 25 mm Perimount pericardial valve. INTRAOPERATIVE TRANSESOPHAGEAL ECHOCARDIOGRAM  SURGEON:  Surgeon(s): Alleen Borne, MD  PHYSICIAN ASSISTANT: WAYNE GOLD PA-C  ANESTHESIA:   general  PATIENT CONDITION:  ICU - intubated and hemodynamically stable.  PRE-OPERATIVE WEIGHT: 87kg  COMPLICATIONS: NO KNOWN

## 2012-07-29 NOTE — Progress Notes (Signed)
  Echocardiogram Echocardiogram Transesophageal has been performed.  [name] [name] 07/29/2012, 11:52 AM

## 2012-07-29 NOTE — Anesthesia Postprocedure Evaluation (Signed)
Anesthesia Post Note  Patient: [name] [name]  Procedure(s) Performed: Procedure(s) (LRB): BENTALL PROCEDURE (N/A) INTRAOPERATIVE TRANSESOPHAGEAL ECHOCARDIOGRAM (N/A)  Anesthesia type: General  Patient location: ICU  Post pain: Pain level controlled  Post assessment: Post-op Vital signs reviewed  Last Vitals:  Filed Vitals:   07/29/12 0613  BP: 134/89  Pulse: 63  Temp: 36.9 C  Resp: 20    Post vital signs: stable  Level of consciousness: Patient remains intubated per anesthesia plan  Complications: No apparent anesthesia complications

## 2012-07-29 NOTE — Anesthesia Procedure Notes (Signed)
Procedures Procedures: Right IJ [name] [name] Catheter Insertion: [id number]: The patient was identified and consent obtained.  TO was performed, and full barrier precautions were used.  The skin was anesthetized with lidocaine-4cc plain with 25g needle.  Once the vein was located with the 22 ga. needle using ultrasound guidance , the wire was inserted into the vein.  The wire location was confirmed with ultrasound.  The tissue was dilated and the 8.5 [place] cordis catheter was carefully inserted. Afterwards [name] [name] catheter was inserted.   The patient tolerated the procedure well.   CE

## 2012-07-29 NOTE — Preoperative (Signed)
Beta Blockers   Reason not to administer Beta Blockers:Not Applicable 
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
## 2012-07-29 NOTE — Transfer of Care (Signed)
Immediate Anesthesia Transfer of Care Note  Patient: [name] [name]  Procedure(s) Performed: Procedure(s): BENTALL PROCEDURE (N/A) INTRAOPERATIVE TRANSESOPHAGEAL ECHOCARDIOGRAM (N/A)  Patient Location: ICU  Anesthesia Type:General  Level of Consciousness: sedated and Patient remains intubated per anesthesia plan  Airway & Oxygen Therapy: Patient remains intubated per anesthesia plan and Patient placed on Ventilator (see vital sign flow sheet for setting)  Post-op Assessment: Report given to PACU RN  Post vital signs: Reviewed and stable  Complications: No apparent anesthesia complications

## 2012-07-29 NOTE — Procedures (Signed)
Extubation Procedure Note  Patient Details:   Name: DUVAN MOUSEL DOB: 23-Feb-1946 MRN: [id number]   Airway Documentation:     Evaluation  O2 sats: stable throughout Complications: No apparent complications Patient did tolerate procedure well. Bilateral Breath Sounds: Clear   Yes  1700 VC, -30 NIF  Fidela Salisbury 07/29/2012, 9:44 PM

## 2012-07-29 NOTE — Progress Notes (Signed)
S/p Bentall, ascending and arch replacement  Intubated, sedated  BP 100/65  Pulse 63  Temp(Src) 97.9 F (36.6 C) (Core (Comment))  Resp 12  Ht 6' (1.829 m)  Wt 194 lb (87.998 kg)  BMI 26.31 kg/m2  SpO2 100%   Intake/Output Summary (Last 24 hours) at 07/29/12 1838 Last data filed at 07/29/12 1700  Gross per 24 hour  Intake 4900.38 ml  Output   2340 ml  Net 2560.38 ml    In a fib since getting back to ICU- on amiodarone

## 2012-07-29 NOTE — Anesthesia Preprocedure Evaluation (Addendum)
Anesthesia Evaluation  Patient identified by MRN, date of birth, ID band Patient awake    Reviewed: Allergy & Precautions, H&P , NPO status , Patient's Chart, lab work & pertinent test results  Airway Mallampati: II TM Distance: >3 FB Neck ROM: Full    Dental  (+) Edentulous Upper and Edentulous Lower   Pulmonary    Pulmonary exam normal       Cardiovascular hypertension, + Peripheral Vascular Disease + Valvular Problems/Murmurs AS     Neuro/Psych    GI/Hepatic (+) Hepatitis -  Endo/Other    Renal/GU      Musculoskeletal   Abdominal Normal abdominal exam  (+)   Peds  Hematology   Anesthesia Other Findings AS  Reproductive/Obstetrics                          Anesthesia Physical Anesthesia Plan  ASA: III  Anesthesia Plan: General   Post-op Pain Management:    Induction: Intravenous  Airway Management Planned: Oral ETT  Additional Equipment: Arterial line, CVP, PA Cath, 3D TEE and Ultrasound Guidance Line Placement  Intra-op Plan:   Post-operative Plan: Post-operative intubation/ventilation  Informed Consent: I have reviewed the patients History and Physical, chart, labs and discussed the procedure including the risks, benefits and alternatives for the proposed anesthesia with the patient or authorized representative who has indicated his/her understanding and acceptance.   Dental advisory given  Plan Discussed with: CRNA and Surgeon  Anesthesia Plan Comments:        Anesthesia Quick Evaluation

## 2012-07-29 NOTE — Interval H&P Note (Signed)
History and Physical Interval Note:  07/29/2012 7:18 AM  [name] [name]  has presented today for surgery, with the diagnosis of Thoracic Aneurysm, Aortic Stenosis  The various methods of treatment have been discussed with the patient and family. After consideration of risks, benefits and other options for treatment, the patient has consented to  Procedure(s): BENTALL PROCEDURE (N/A) INTRAOPERATIVE TRANSESOPHAGEAL ECHOCARDIOGRAM (N/A) as a surgical intervention .  The patient's history has been reviewed, patient examined, no change in status, stable for surgery.  I have reviewed the patient's chart and labs.  Questions were answered to the patient's satisfaction.     Alleen Borne

## 2012-07-30 ENCOUNTER — Inpatient Hospital Stay (HOSPITAL_COMMUNITY): Payer: Medicare Other

## 2012-07-30 LAB — BASIC METABOLIC PANEL
CO2: 23 mEq/L (ref 19–32)
Glucose, Bld: 140 mg/dL — ABNORMAL HIGH (ref 70–99)
Potassium: 4.7 mEq/L (ref 3.5–5.1)
Sodium: 138 mEq/L (ref 135–145)

## 2012-07-30 LAB — POCT I-STAT, CHEM 8
BUN: 20 mg/dL (ref 6–23)
Chloride: 102 mEq/L (ref 96–112)
Creatinine, Ser: 0.8 mg/dL (ref 0.50–1.35)
Glucose, Bld: 136 mg/dL — ABNORMAL HIGH (ref 70–99)
HCT: 28 % — ABNORMAL LOW (ref 39.0–52.0)
Potassium: 4.7 mEq/L (ref 3.5–5.1)

## 2012-07-30 LAB — CBC
HCT: 26.8 % — ABNORMAL LOW (ref 39.0–52.0)
Hemoglobin: 10.2 g/dL — ABNORMAL LOW (ref 13.0–17.0)
Hemoglobin: 9.6 g/dL — ABNORMAL LOW (ref 13.0–17.0)
MCH: 32.4 pg (ref 26.0–34.0)
MCHC: 35.8 g/dL (ref 30.0–36.0)
MCV: 90.5 fL (ref 78.0–100.0)
RBC: 3.13 MIL/uL — ABNORMAL LOW (ref 4.22–5.81)
RDW: 13.8 % (ref 11.5–15.5)
WBC: 13.4 10*3/uL — ABNORMAL HIGH (ref 4.0–10.5)

## 2012-07-30 LAB — PREPARE PLATELET PHERESIS: Unit division: 0

## 2012-07-30 LAB — GLUCOSE, CAPILLARY
Glucose-Capillary: 91 mg/dL (ref 70–99)
Glucose-Capillary: 96 mg/dL (ref 70–99)
Glucose-Capillary: 130 mg/dL — ABNORMAL HIGH (ref 70–99)
Glucose-Capillary: 124 mg/dL — ABNORMAL HIGH (ref 70–99)

## 2012-07-30 LAB — MAGNESIUM
Magnesium: 2.4 mg/dL (ref 1.5–2.5)
Magnesium: 2.2 mg/dL (ref 1.5–2.5)

## 2012-07-30 MED ORDER — INSULIN ASPART 100 UNIT/ML ~~LOC~~ SOLN: 0.0000 [IU] | SUBCUTANEOUS | Status: DC

## 2012-07-30 MED ORDER — INSULIN ASPART 100 UNIT/ML ~~LOC~~ SOLN
0.0000 [IU] | SUBCUTANEOUS | Status: DC
  Administered 2012-07-30 (×2): 2 [IU] via SUBCUTANEOUS

## 2012-07-30 MED ORDER — ALPRAZOLAM 0.5 MG PO TABS
1.0000 mg | ORAL_TABLET | Freq: Every evening | ORAL | Status: DC | PRN
  Administered 2012-07-30 – 2012-08-07 (×7): 1 mg via ORAL
  Filled 2012-07-30: qty 2
  Filled 2012-07-30 (×3): qty 1
  Filled 2012-07-30 (×2): qty 2
  Filled 2012-07-30 (×2): qty 1
  Filled 2012-07-30: qty 2
  Filled 2012-07-30: qty 1

## 2012-07-30 MED ORDER — FUROSEMIDE 10 MG/ML IJ SOLN: 20.0000 mg | Freq: Two times a day (BID) | INTRAMUSCULAR | Status: DC

## 2012-07-30 MED FILL — Heparin Sodium (Porcine) Inj 1000 Unit/ML: INTRAMUSCULAR | Qty: 10 | Status: AC

## 2012-07-30 MED FILL — Electrolyte-R (PH 7.4) Solution: INTRAVENOUS | Qty: 3000 | Status: AC

## 2012-07-30 MED FILL — Mannitol IV Soln 20%: INTRAVENOUS | Qty: 500 | Status: AC

## 2012-07-30 MED FILL — Sodium Chloride IV Soln 0.9%: INTRAVENOUS | Qty: 1000 | Status: AC

## 2012-07-30 MED FILL — Sodium Chloride Irrigation Soln 0.9%: Qty: 3000 | Status: AC

## 2012-07-30 MED FILL — Sodium Bicarbonate IV Soln 8.4%: INTRAVENOUS | Qty: 50 | Status: AC

## 2012-07-30 MED FILL — Lidocaine HCl IV Inj 20 MG/ML: INTRAVENOUS | Qty: 5 | Status: AC

## 2012-07-30 MED FILL — Heparin Sodium (Porcine) Inj 1000 Unit/ML: INTRAMUSCULAR | Qty: 30 | Status: AC

## 2012-07-30 NOTE — Progress Notes (Signed)
Patient ID: [name] [name], male   DOB: [birth year], 67 y.o.   MRN: [id number]                   [address]            [name] [address]          [phone]     1 Day Post-Op Procedure(s) (LRB): BENTALL PROCEDURE (N/A) INTRAOPERATIVE TRANSESOPHAGEAL ECHOCARDIOGRAM (N/A)  Total Length of Stay:  LOS: 1 day  BP 105/54  Pulse 79  Temp(Src) 98.2 F (36.8 C) (Oral)  Resp 19  Ht 6' (1.829 m)  Wt 204 lb 5.9 oz (92.7 kg)  BMI 27.71 kg/m2  SpO2 97%  .Intake/Output     04/29 0701 - 04/30 0700   P.O. 300   I.V. (mL/kg) 22 (0.2)   Blood    NG/GT    IV Piggyback    Total Intake(mL/kg) 322 (3.5)   Urine (mL/kg/hr) 650 (0.6)   Blood    Chest Tube 50 (0)   Total Output 700   Net -378         . sodium chloride Stopped (07/29/12 1600)  . sodium chloride Stopped (07/29/12 1600)  . sodium chloride    . amiodarone (NEXTERONE PREMIX) 360 mg/200 mL dextrose 30 mg/hr (07/30/12 1900)  . insulin (NOVOLIN-R) infusion Stopped (07/30/12 0200)  . lactated ringers 20 mL/hr at 07/30/12 1900  . nitroGLYCERIN Stopped (07/29/12 1600)  . phenylephrine (NEO-SYNEPHRINE) Adult infusion Stopped (07/30/12 0800)     Lab Results  Component Value Date   WBC 15.9* 07/30/2012   HGB 9.5* 07/30/2012   HCT 28.0* 07/30/2012   PLT 120* 07/30/2012   GLUCOSE 136* 07/30/2012   CHOL 138 01/17/2012   TRIG 63.0 01/17/2012   HDL 42.40 01/17/2012   LDLCALC 83 01/17/2012   ALT 19 07/24/2012   AST 20 07/24/2012   NA 138 07/30/2012   K 4.7 07/30/2012   CL 102 07/30/2012   CREATININE 0.80 07/30/2012   BUN 20 07/30/2012   CO2 23 07/30/2012   PSA 1.91 01/17/2012   INR 1.42 07/29/2012   HGBA1C 5.5 07/24/2012   Alert and feels well Walked in unit A paced with underlying bradycardia  Delight Ovens MD  Beeper [phone] Office [phone] 07/30/2012 7:36 PM

## 2012-07-30 NOTE — Op Note (Signed)
[name] [name], [name] [name]                ACCOUNT NO.:  [id number]  MEDICAL RECORD NO.:  [id number]  LOCATION:  2307                         FACILITY:  [facility]  PHYSICIAN:  Evelene Croon, M.D.     DATE OF BIRTH:  28-Jul-1945  DATE OF PROCEDURE:  07/29/2012 DATE OF DISCHARGE:                              OPERATIVE REPORT   PREOPERATIVE DIAGNOSIS:  Moderate-to-severe aortic stenosis with aortic root, ascending aorta and aortic arch aneurysm.  POSTOPERATIVE DIAGNOSIS:  Moderate-to-severe aortic stenosis with aortic root, ascending aorta and aortic arch aneurysm.  OPERATIVE PROCEDURE: 1. Right axillary artery end-to-side 8 mm Hemashield graft anastomosis     for arterial inflow. 2. Median sternotomy. 3. Extracorporeal circulation. 4. Aortic arch replacement under deep hypothermic circulatory arrest     using a 30-mm Hemashield graft with 10 mm x 8 mm x 8 mm x 10 mm     side grafts. 5. Left common carotid artery bypass. 6. Innominate artery bypass. 7. Bentall procedure using a composite 28 mm Gelweave Valsalva graft     and a 25 mm Edwards Perimount pericardial valve.  ATTENDING SURGEON:  Evelene Croon, M.D.  ASSISTANT:  Rowe Clack, P.A.-C.  ANESTHESIA:  General endotracheal.  CLINICAL HISTORY:  This patient is a 67 year old gentleman with history of hypertension and dyslipidemia as well as known aortic stenosis, who has been followed by Dr. Patty Sermons.  He said that he was in [place] last October and was climbing some steep hills and he developed chest pressure and pain.  Repeat echocardiogram after returned on February 22, 2012 showed a mean aortic valve gradient of 36.  At peak gradient of 58. Plan was made to repeat his echocardiogram in 1 year, but he continued having occasional episodes of chest discomfort while mowing his yard, and when he went back to the gym to try to get in shape and lose weight. He started having chest discomfort on the treadmill.  He underwent cardiac  catheterization, which showed normal right and left heart pressures.  His cardiac output was 6.5 liters/minute.  The peak gradient was 28 mmHg across the aortic valve.  The mean gradient was 25 mmHg. The aortic valve area was 1.5 cm2.  Coronary angiography showed no significant coronary artery disease and his left ventricular function was normal.  Aortic root injection did show a markedly dilated aortic root and proximal ascending aorta.  The patient underwent CT angiogram of the chest that showed fusiform aneurysmal dilatation of the aortic root and ascending aorta with a maximum diameter of 52 mm extending out into the mid aortic arch, just beyond the left common carotid artery. Beyond left common carotid artery, the aorta narrowed back down to about 28-30 mm.  There is extensive calcification of the valve leaflets. There was no evidence of aortic dissection.  The patient also had an enlarged right lobe of the thyroid gland with a large mass.  After reviewing all these studies, I felt that the best treatment would be to proceed with replacement of his aortic valve and ascending aorta and arch from the level of the aortic valve down to the distal aortic arch. I discussed the pros and cons of mechanical and  tissue valves with the patient and his wife.  He was adamant that he wanted a tissue valve because he did not want to be on Coumadin.  Since he is 67 years old, I felt that was an acceptable choice.  I discussed the small risk of structural valve deterioration requiring further intervention in the future.  He understood and agreed to proceed.  I discussed the operative procedure of replacement of the aortic valve, ascending aorta, and aortic arch under deep hypothermic circulatory arrest.  I discussed alternatives, benefits, and risks including, but not limited to, bleeding, blood transfusion, infection, stroke, myocardial infarction, heart block requiring permanent pacemaker, coronary  anastomotic stenosis, organ dysfunction, and death.  He understood all this and agreed to proceed.  DESCRIPTION OF PROCEDURE:  The patient was seen in the preoperative holding area.  Proper patient and proper operation were confirmed after reviewing the chart, CT scan, and discussion with the patient.  Then, the patient was taken back to the operating room and placed on table in supine position.  Preoperative intravenous antibiotics were given. After induction of general endotracheal anesthesia, a Foley catheter was placed in bladder using sterile technique.  Then, the chest, abdomen, and both lower extremities were prepped and draped in usual sterile manner.  TEE was performed by Anesthesiology.  This showed what appeared to be a bicuspid aortic valve that was severely calcified with poorly mobile leaflets.  There was no mitral regurgitation.  Left ventricular function was well preserved.  Then, the chest was entered through a median sternotomy incision.  The pericardium opened midline.  Examination of the heart showed good right ventricular contractility.  The ascending aorta was noted to be aneurysmal.  There was no palpable plaque in the wall.  Then, attention was turned to axillary artery anastomosis.  A 5 cm transverse incision was made in the right infraclavicular region and was carried down through the subcutaneous tissue using electrocautery.  The pectoralis major muscle split along its fibers and retracted to expose the pectoralis minor muscle which was tracked laterally.  Dissection was continued down to the axillary artery.  The brachial plexus was identified and carefully avoided.  The axillary artery was identified and encircled with tapes.  It was a good size artery and free of disease.  Then, the patient was heparinized when an adequate ACT was obtained, the right axillary artery was clamped proximally and distally and opened longitudinally for about 1 cm.  An 8 mm  Hemashield tube graft was then anastomosed to the right axillary artery in an end-to-side manner using continuous 5-0 Prolene suture.  This anastomosis was coated with CoSeal for hemostasis.  Then, the vascular clamps were removed. Then, the graft de-aired.  It was connected to the arterial end of the bypass circuit.  Then, attention was returned to the chest.  The right atrium was cannulated using a large 2 stage venous cannula for venous outflow.  Left ventricular vent was placed to right superior pulmonary vein and retrograde cardioplegic cannula was inserted through a pursestring suture in the right atrium and advanced in the coronary sinus without difficulty.  The patient was placed on cardiopulmonary bypass, and cooling begun to a bladder temperature of about 18 degrees centigrade.  While the patient was being cooled, the ascending aorta and aortic arch was mobilized. The innominate artery was encircled with tape.  There was tortuous and the origin was grossly aneurysmal.  The left common carotid artery was also encircled with a tape, and appeared to  be of normal size. Examination of the aortic arch showed that it was aneurysmal and came back towards the normal size just beyond the left common carotid artery origin.  An insulating pad was placed in the pericardium and attempted probe in the septum.  When the patient's bladder temperature then cooled to around 18 to 19 degrees centigrade, and we had been cooling for about 45 minutes, the head was placed in steep Trendelenburg position.  The patient was given 125 mg of Solu-Medrol and a dose of etomidate by Anesthesiology.  Ice was packed around the head.  Then, circulatory arrest was begun and the blood volume draining back into the palm.  The proximal innominate artery was clamped and then antegrade cerebral perfusion was started at about 500-600 mL/minute.  Blood pressure was monitored through the right radial arterial line.   Then, the innominate artery was transected at its origin.  The left common carotid artery was clamped and transected at its origin.  The aortic arch was transected about 1 cm proximal to the takeoff of the left subclavian artery.  The aortic arch was completely resected.  The aortic caliber at this level was about 30 mm.  Then, a 30 x 10 x 8 x 8 x 10 mm Hemashield platinum vascular graft was brought into the operative field.  It was cut to the appropriate length and anastomosed end-to-end to the distal aortic arch using continuous 3-0 Prolene suture with a felt strip to reinforce the anastomosis.  This anastomosis was lightly coated with CoSeal for hemostasis.  Then, one of the 8 mm side arm graft was cut to the appropriate length and anastomosed end-to-end to the proximal left common carotid artery using continuous 5-0 Prolene suture.  Then, the 10- mm side arm of this aortic arch graft was connected to the arterial end of the bypass circuit.  Clamps were placed across the other side arms of the graft.  Then, cardiopulmonary bypass was slowly reestablished through the 10-mm side arm graft as well as through the right axillary artery as flow was brought up, the grafts were de-aired and the clamp was returned to the proximal innominate artery.  Full cardiopulmonary bypass was resumed.  The total circulatory arrest time was about 43 minutes, and the antegrade cerebral perfusion time was 39 minutes.  The patient was then warmed back towards 28 degrees centigrade.  We did give cold blood red retrograde cardioplegia at the start of the circulatory arrest time, and then at 20 minutes intervals throughout the remainder of the case to maintain myocardial temperature around 10 degrees centigrade or less.  We also used CO2 insufflation in the pericardium to minimize intracardiac air.  Then with the innominate artery being perfused by the axillary artery cannula and the body and left common carotid  artery being perfused through the side arm graft from the aortic arch graft.  Attention was returned to the aortic root and ascending aorta.  The ascending aorta and aortic root were completely mobilized.  It was opened longitudinally in the midline.  The position of the left and right coronary ostia were identified.  There was some small amount of calcified plaque around the ostium of the left main coronary artery, which caused no problems and is not caught in any obstruction.  Examination of the native valve showed that there were essentially 2 cusps.  The left coronary cusp was large and there was complete fusion between the right and noncoronary cusps. The commissure between these 2 cusps looked  rudimentary and was heavily calcified.  I suspect this was a congenitally bicuspid valve.  There was moderate annular calcification.  Then, the right and left coronary ostia were excised from the aortic wall with a button of aortic wall around them.  They were retracted all the way to prevent rotation.  The native valve leaflets were excised.  The anulus was decalcified with rongeurs. Care was taken move all particulate debris.  Left ventricle was directly inspected and irrigated with iced saline solution.  Then, anulus was sized and a 25 mm Edwards pericardial valve was chosen.  This had model #2700 and serial [id number].  A series of pledgeted 2-0 Ethibond horizontal mattress sutures were placed around the aortic anulus with the pledgets in the subannular position.  After the valve was repaired, it was placed inside a Gelweave Valsalva graft that was 28 mm.  It was placed so the sewing cuff of the valve was adjacent to the proximal end of the Valsalva portion of the graft.  Then, a 4-0 Prolene suture was used to join the graft to the sewing cuff in a continuous manner.  The proximal horizontal cuff on this graft was cut to the appropriate length, so there were about 3 rings remaining.  Then,  the valve sutures were placed through this cuff on the Valsalva graft and the graft lowered in place.  The sutures were tied sequentially.  This anastomosis was lightly coated with CoSeal for hemostasis.  Then, the position of the right and left coronary anastomosis was marked on the Valsalva portion of the graft.  Small openings were made using an eye cautery. The left coronary anastomosis was performed 1st using continuous 5-0 Prolene suture in a end-to-side manner.  This was followed by the right coronary anastomosis in an end-to-side manner using continuous 5-0 Prolene suture.  These anastomoses were coated with CoSeal for hemostasis.  Then, the aortic arch graft and the aortic root graft were cut to the appropriate length and anastomosed in an end-to-end manner using continuous 3-0 Prolene suture.  An aortic vent was then placed through a pursestring suture in the graft.  De-airing maneuvers were performed and the head placed in Trendelenburg position and the crossclamp removed with a time of 133 minutes.  There was spontaneous return of a complete heart block rhythm.  The patient rewarmed to 37 degrees centigrade.  While we were finishing rewarming, the 10-mm side arm on the aortic arch graft was cut to the appropriate length and passed posterior to the innominate vein and anastomosis to the mid portion of the innominate artery in an end-to-end manner using continuous 5-0 Prolene suture.  Then, the clamps were removed from this graft and the innominate artery.  All the anastomoses appeared hemostatic at this point.  Two temporary right ventricular and right atrial pacing wires placed and brought through the skin, and the patient was paced in __________ sequential mode at 90 beats per minute.  When the patient had rewarmed to 37 degrees centigrade, he was weaned from cardiopulmonary bypass on no inotropic agents.  Total bypass time was 270 minutes.  Cardiac function appeared  excellent with a cardiac output of 5 L per minute.  TEE showed a normal functioning aortic valve prosthesis with no evidence of perivalvular leak or central regurgitation.  Left ventricular function appeared well preserved. There was no mitral regurgitation.  Then, protamine was given and the venous cannula removed.  The side arm graft off the aortic arch graft was double ligated with  0 silk tie and then divided and suture ligated with a continuous 4-0 Prolene suture.  The axillary artery graft was likewise double ligated with a 0 silk tie, and then divided and suture ligated with continuous 4-0 Prolene suture.  There was good hemostasis. We did give the patient 1 unit of platelets due to platelet count of 90,000 while bypass.  After obtaining hemostasis, two chest tubes were placed with a tube in the posterior pericardium and 1 into the mediastinum.  The sternum was then closed using double #6 stainless steel wires.  The fascia was closed with continuous #1 Vicryl suture. Subcutaneous tissue was closed with continuous 2-0 Vicryl and skin with a 3-0 Vicryl subcuticular closure.  The right infraclavicular incision was closed in layers in similar manner.  The sponge, needle, and instrument counts were correct according to the scrub nurse.  Dry sterile dressing was applied over the incisions around chest tubes, which were hooked to Pleur-Evac suction.  The patient remained hemodynamically stable and transferred to the SICU in guarded, but stable condition.     Evelene Croon, M.D.     BB/MEDQ  D:  07/29/2012  T:  07/30/2012  Job:  [id number]

## 2012-07-30 NOTE — Progress Notes (Addendum)
                   [address]            [name] [address]          [phone]      1 Day Post-Op Procedure(s) (LRB): BENTALL PROCEDURE (N/A) INTRAOPERATIVE TRANSESOPHAGEAL ECHOCARDIOGRAM (N/A)  Subjective: Patient with incisional pain.  Objective: Vital signs in last 24 hours: Temp:  [95.4 F (35.2 C)-98.6 F (37 C)] 97.5 F (36.4 C) (04/29 0700) Pulse Rate:  [48-94] 79 (04/29 0700) Cardiac Rhythm:  [-] Atrial paced (04/28 2100) Resp:  [10-21] 18 (04/29 0700) BP: (88-129)/(52-82) 96/59 mmHg (04/29 0700) SpO2:  [94 %-100 %] 99 % (04/29 0700) Arterial Line BP: (82-129)/(50-77) 110/57 mmHg (04/29 0700) FiO2 (%):  [40 %-50 %] 40 % (04/28 2115) Weight:  [92.7 kg (204 lb 5.9 oz)] 92.7 kg (204 lb 5.9 oz) (04/29 0530)  Pre op weight  87 kg Current Weight  07/30/12 92.7 kg (204 lb 5.9 oz)    Hemodynamic parameters for last 24 hours: PAP: (27-43)/(14-26) 32/17 mmHg CO:  [2.5 L/min-7.1 L/min] 7.1 L/min CI:  [1.2 L/min/m2-3.4 L/min/m2] 3.4 L/min/m2  Intake/Output from previous day: 04/28 0701 - 04/29 0700 In: 6841.2 [P.O.:360; I.V.:4296.2; Blood:905; NG/GT:30; IV Piggyback:1250] Out: 3710 [Urine:2500; Blood:800; Chest Tube:410]   Physical Exam:  Cardiovascular: RRR, no murmurs or  gallops Pulmonary: Slightly diminshed at bases; no rales, wheezes, or rhonchi. Abdomen: Soft, non tender, bowel sounds present. Extremities: SCDs in place Wound: Dressing is clean and dry.   Neurologic: Grossly intact without focal deficits  Lab Results: CBC: Recent Labs  07/29/12 2120 07/30/12 0445  WBC 14.0* 13.4*  HGB 10.7*  9.9* 10.2*  HCT 29.6*  29.0* 27.9*  PLT 152 124*   BMET:  Recent Labs  07/29/12 2120 07/30/12 0445  NA 144 138  K 5.0 4.7  CL 110 108  CO2  --  23  GLUCOSE 129* 140*  BUN 22 22  CREATININE 0.98  1.00 0.86  CALCIUM  --  7.5*    PT/INR:  Lab Results  Component Value Date   INR 1.42 07/29/2012   INR 0.91 07/24/2012   INR 1.0  06/28/2012   ABG:  INR: Will add last result for INR, ABG once components are confirmed Will add last 4 CBG results once components are confirmed  Assessment/Plan:  1. CV - A paced at 80 this am. On Amiodarone gttp, Neo synephrine gttp, and Lopressor 12.5 bid. Wean gttps as tolerate 2.  Pulmonary - Chest tubes with 410 cc of output since surgery. Possible remove chest tubes later this am.CXR this am appears to show no pneumothorax, mild pulmonary vascular congestion, small bilateral pleural effusions. Encourage incentive spirometer 3. Volume Overload - Begin gentle diuresis 4.  Acute blood loss anemia - H and H 10.2 and 27.9 this am. 5.Thrombocytopenia-platelets 124,000 6.CBGs 130/124/119. Pre op HGA1C 5.5. Will continue accu checks and SS for 1-2 days then stop  7.See progression orders  ZIMMERMAN,DONIELLE MPA-C 07/30/2012,7:50 AM   Chart reviewed, patient examined, agree with above. He is doing well. Sinus 55 under pacer on amio. Will continue amio and discontinue B-Blocker. Hold off on diuresis until stable off neo. Dangle again and remove chest tubes.

## 2012-07-30 NOTE — Progress Notes (Signed)
Utilization Review Completed. 07/30/2012

## 2012-07-31 ENCOUNTER — Inpatient Hospital Stay (HOSPITAL_COMMUNITY): Payer: Medicare Other

## 2012-07-31 LAB — GLUCOSE, CAPILLARY
Glucose-Capillary: 102 mg/dL — ABNORMAL HIGH (ref 70–99)
Glucose-Capillary: 118 mg/dL — ABNORMAL HIGH (ref 70–99)

## 2012-07-31 LAB — CBC
HCT: 26.3 % — ABNORMAL LOW (ref 39.0–52.0)
Hemoglobin: 9.2 g/dL — ABNORMAL LOW (ref 13.0–17.0)
RBC: 2.86 MIL/uL — ABNORMAL LOW (ref 4.22–5.81)
RDW: 13.7 % (ref 11.5–15.5)
WBC: 13.3 10*3/uL — ABNORMAL HIGH (ref 4.0–10.5)

## 2012-07-31 LAB — BASIC METABOLIC PANEL
Chloride: 103 mEq/L (ref 96–112)
GFR calc Af Amer: >90 mL/min (ref >90)
Potassium: 4.3 mEq/L (ref 3.5–5.1)
Sodium: 136 mEq/L (ref 135–145)

## 2012-07-31 MED ORDER — AMIODARONE HCL 200 MG PO TABS
400.0000 mg | ORAL_TABLET | Freq: Two times a day (BID) | ORAL | Status: DC
  Administered 2012-07-31 – 2012-08-01 (×3): 400 mg via ORAL
  Filled 2012-07-31 (×6): qty 2

## 2012-07-31 MED ORDER — BISACODYL 10 MG RE SUPP
10.0000 mg | Freq: Every day | RECTAL | Status: DC | PRN
  Administered 2012-08-04: 10 mg via RECTAL

## 2012-07-31 MED ORDER — TRAMADOL HCL 50 MG PO TABS
50.0000 mg | ORAL_TABLET | ORAL | Status: DC | PRN
  Administered 2012-08-05 – 2012-08-06 (×2): 100 mg via ORAL
  Filled 2012-07-31 (×2): qty 2

## 2012-07-31 MED ORDER — BISACODYL 5 MG PO TBEC
10.0000 mg | DELAYED_RELEASE_TABLET | Freq: Every day | ORAL | Status: DC | PRN
  Administered 2012-07-31: 10 mg via ORAL
  Filled 2012-07-31 (×5): qty 2

## 2012-07-31 MED ORDER — MOVING RIGHT ALONG BOOK
Freq: Once | Status: AC
  Administered 2012-07-31: 1
  Filled 2012-07-31 (×2): qty 1

## 2012-07-31 MED ORDER — SODIUM CHLORIDE 0.9 % IV SOLN
250.0000 mL | INTRAVENOUS | Status: DC | PRN
  Administered 2012-08-01: 250 mL via INTRAVENOUS

## 2012-07-31 MED ORDER — SODIUM CHLORIDE 0.9 % IJ SOLN
3.0000 mL | Freq: Two times a day (BID) | INTRAMUSCULAR | Status: DC
  Administered 2012-07-31 – 2012-08-02 (×3): 3 mL via INTRAVENOUS

## 2012-07-31 MED ORDER — SODIUM CHLORIDE 0.9 % IJ SOLN: 3.0000 mL | INTRAMUSCULAR | Status: DC | PRN

## 2012-07-31 MED ORDER — DOCUSATE SODIUM 100 MG PO CAPS
200.0000 mg | ORAL_CAPSULE | Freq: Every day | ORAL | Status: DC
  Administered 2012-07-31 – 2012-08-01 (×2): 200 mg via ORAL
  Filled 2012-07-31 (×2): qty 1
  Filled 2012-07-31: qty 2

## 2012-07-31 MED ORDER — FUROSEMIDE 40 MG PO TABS
40.0000 mg | ORAL_TABLET | Freq: Every day | ORAL | Status: DC
  Administered 2012-07-31 – 2012-08-01 (×2): 40 mg via ORAL
  Filled 2012-07-31 (×2): qty 1

## 2012-07-31 MED ORDER — ONDANSETRON HCL 4 MG/2ML IJ SOLN
4.0000 mg | Freq: Four times a day (QID) | INTRAMUSCULAR | Status: DC | PRN
  Administered 2012-07-31 – 2012-08-01 (×2): 4 mg via INTRAVENOUS
  Filled 2012-07-31: qty 2

## 2012-07-31 MED ORDER — ASPIRIN EC 325 MG PO TBEC
325.0000 mg | DELAYED_RELEASE_TABLET | Freq: Every day | ORAL | Status: DC
  Administered 2012-07-31 – 2012-08-01 (×2): 325 mg via ORAL
  Filled 2012-07-31 (×3): qty 1

## 2012-07-31 MED ORDER — ONDANSETRON HCL 4 MG PO TABS
4.0000 mg | ORAL_TABLET | Freq: Four times a day (QID) | ORAL | Status: DC | PRN
  Administered 2012-08-01: 4 mg via ORAL
  Filled 2012-07-31: qty 1

## 2012-07-31 MED ORDER — OXYCODONE HCL 5 MG PO TABS
5.0000 mg | ORAL_TABLET | ORAL | Status: DC | PRN
  Administered 2012-07-31 – 2012-08-01 (×2): 10 mg via ORAL
  Filled 2012-07-31 (×2): qty 2

## 2012-07-31 MED ORDER — ACETAMINOPHEN 325 MG PO TABS: 650.0000 mg | ORAL_TABLET | Freq: Four times a day (QID) | ORAL | Status: DC | PRN

## 2012-07-31 MED ORDER — POTASSIUM CHLORIDE CRYS ER 20 MEQ PO TBCR
40.0000 meq | EXTENDED_RELEASE_TABLET | Freq: Every day | ORAL | Status: DC
  Administered 2012-07-31 – 2012-08-01 (×2): 40 meq via ORAL
  Filled 2012-07-31: qty 2
  Filled 2012-07-31: qty 1
  Filled 2012-07-31: qty 2

## 2012-07-31 MED ORDER — FAMOTIDINE 20 MG PO TABS
20.0000 mg | ORAL_TABLET | Freq: Two times a day (BID) | ORAL | Status: DC
  Administered 2012-07-31 – 2012-08-01 (×3): 20 mg via ORAL
  Filled 2012-07-31 (×4): qty 1

## 2012-07-31 MED FILL — Potassium Chloride Inj 2 mEq/ML: INTRAVENOUS | Qty: 40 | Status: AC

## 2012-07-31 MED FILL — Magnesium Sulfate Inj 50%: INTRAMUSCULAR | Qty: 10 | Status: AC

## 2012-07-31 NOTE — Progress Notes (Signed)
2 Days Post-Op Procedure(s) (LRB): BENTALL PROCEDURE (N/A) INTRAOPERATIVE TRANSESOPHAGEAL ECHOCARDIOGRAM (N/A) Subjective: Only complaint is of shoulder pain.  Objective: Vital signs in last 24 hours: Temp:  [97.7 F (36.5 C)-98.4 F (36.9 C)] 98 F (36.7 C) (04/30 0813) Pulse Rate:  [79-81] 81 (04/30 0705) Cardiac Rhythm:  [-] Atrial paced (04/29 2000)  Sinus 72 under pacer on IV amiodarone. Resp:  [13-30] 25 (04/30 0705) BP: (93-125)/(48-78) 125/69 mmHg (04/30 0705) SpO2:  [90 %-100 %] 92 % (04/30 0705) Arterial Line BP: (104-117)/(50-59) 109/54 mmHg (04/29 1000) Weight:  [94.9 kg (209 lb 3.5 oz)] 94.9 kg (209 lb 3.5 oz) (04/30 0500)  Hemodynamic parameters for last 24 hours: PAP: (27-35)/(12-19) 32/14 mmHg  Intake/Output from previous day: 04/29 0701 - 04/30 0700 In: 1302.8 [P.O.:300; I.V.:902.8; IV Piggyback:100] Out: 1540 [Urine:1490; Chest Tube:50] Intake/Output this shift:    General appearance: alert and cooperative Neurologic: intact Heart: regular rate and rhythm, S1, S2 normal, no murmur, click, rub or gallop Lungs: diminished breath sounds bibasilar Extremities: edema mild Wound: incisions ok  Lab Results:  Recent Labs  07/30/12 1700 07/30/12 1715 07/31/12 0410  WBC 15.9*  --  13.3*  HGB 9.6* 9.5* 9.2*  HCT 26.8* 28.0* 26.3*  PLT 120*  --  94*   BMET:  Recent Labs  07/30/12 0445  07/30/12 1715 07/31/12 0410  NA 138  --  138 136  K 4.7  --  4.7 4.3  CL 108  --  102 103  CO2 23  --   --  28  GLUCOSE 140*  --  136* 122*  BUN 22  --  20 20  CREATININE 0.86  < > 0.80 0.98  CALCIUM 7.5*  --   --  7.9*  < > = values in this interval not displayed.  PT/INR:  Recent Labs  07/29/12 1552  LABPROT 17.0*  INR 1.42   ABG    Component Value Date/Time   PHART 7.310* 07/29/2012 2314   HCO3 22.8 07/29/2012 2314   TCO2 24 07/30/2012 1715   ACIDBASEDEF 4.0* 07/29/2012 2314   O2SAT 98.0 07/29/2012 2314   CBG (last 3)   Recent Labs  07/30/12 2003  07/30/12 2330 07/31/12 0418  GLUCAP 134* 126* 102*   CXR:  Left basilar atelectasis  Assessment/Plan: S/P Procedure(s) (LRB): BENTALL PROCEDURE (N/A) INTRAOPERATIVE TRANSESOPHAGEAL ECHOCARDIOGRAM (N/A) Postop A-fib converted to sinus on IV amiodarone. Will switch to po. HR only 72  And was 55 yesterday so will hold off on B-blocker for now. Mobilize Diuresis Plan for transfer to step-down: see transfer orders   LOS: 2 days    Cassy Sprowl K 07/31/2012

## 2012-08-01 ENCOUNTER — Inpatient Hospital Stay (HOSPITAL_COMMUNITY): Payer: Medicare Other

## 2012-08-01 ENCOUNTER — Inpatient Hospital Stay (HOSPITAL_COMMUNITY): Payer: Medicare Other | Admitting: Anesthesiology

## 2012-08-01 ENCOUNTER — Encounter (HOSPITAL_COMMUNITY)
Admission: RE | Disposition: A | Discharge status: Home / Self Care | Payer: Self-pay | Source: Ambulatory Visit | Attending: Surgery

## 2012-08-01 ENCOUNTER — Encounter (HOSPITAL_COMMUNITY): Payer: Self-pay | Admitting: Anesthesiology

## 2012-08-01 DIAGNOSIS — IMO0002 Reserved for concepts with insufficient information to code with codable children: Secondary | ICD-10-CM

## 2012-08-01 HISTORY — PX: MEDIASTINAL EXPLORATION: SHX5065

## 2012-08-01 LAB — POCT I-STAT 4, (NA,K, GLUC, HGB,HCT)
Glucose, Bld: 188 mg/dL — ABNORMAL HIGH (ref 70–99)
Glucose, Bld: 174 mg/dL — ABNORMAL HIGH (ref 70–99)
Glucose, Bld: 192 mg/dL — ABNORMAL HIGH (ref 70–99)
Glucose, Bld: 109 mg/dL — ABNORMAL HIGH (ref 70–99)
HCT: 24 % — ABNORMAL LOW (ref 39.0–52.0)
HCT: 19 % — ABNORMAL LOW (ref 39.0–52.0)
HCT: 20 % — ABNORMAL LOW (ref 39.0–52.0)
HCT: 25 % — ABNORMAL LOW (ref 39.0–52.0)
HCT: 31 % — ABNORMAL LOW (ref 39.0–52.0)
HCT: 34 % — ABNORMAL LOW (ref 39.0–52.0)
Hemoglobin: 8.2 g/dL — ABNORMAL LOW (ref 13.0–17.0)
Hemoglobin: 6.5 g/dL — CL (ref 13.0–17.0)
Hemoglobin: 7.8 g/dL — ABNORMAL LOW (ref 13.0–17.0)
Hemoglobin: 10.5 g/dL — ABNORMAL LOW (ref 13.0–17.0)
Hemoglobin: 11.6 g/dL — ABNORMAL LOW (ref 13.0–17.0)
Potassium: 4.5 mEq/L (ref 3.5–5.1)
Potassium: 5.7 mEq/L — ABNORMAL HIGH (ref 3.5–5.1)
Potassium: 4.6 mEq/L (ref 3.5–5.1)
Potassium: 4.1 meq/L (ref 3.5–5.1)
Sodium: 137 mEq/L (ref 135–145)
Sodium: 138 mEq/L (ref 135–145)
Sodium: 137 mEq/L (ref 135–145)
Sodium: 137 mEq/L (ref 135–145)
Sodium: 140 meq/L (ref 135–145)

## 2012-08-01 LAB — PLATELET COUNT
Platelets: 55 10*3/uL — ABNORMAL LOW (ref 150–400)
Platelets: 94 K/uL — ABNORMAL LOW (ref 150–400)

## 2012-08-01 LAB — POCT I-STAT 3, ART BLOOD GAS (G3+)
Acid-Base Excess: 4 mmol/L — ABNORMAL HIGH (ref 0.0–2.0)
Acid-base deficit: 2 mmol/L (ref 0.0–2.0)
Bicarbonate: 23.9 mEq/L (ref 20.0–24.0)
Bicarbonate: 21.4 mEq/L (ref 20.0–24.0)
Bicarbonate: 21.2 mEq/L (ref 20.0–24.0)
Bicarbonate: 28.4 meq/L — ABNORMAL HIGH (ref 20.0–24.0)
O2 Saturation: 100 %
O2 Saturation: 83 %
O2 Saturation: 92 %
O2 Saturation: 94 %
TCO2: 25 mmol/L (ref 0–100)
TCO2: 22 mmol/L (ref 0–100)
TCO2: 30 mmol/L (ref 0–100)
pCO2 arterial: 44.6 mmHg (ref 35.0–45.0)
pCO2 arterial: 30.2 mmHg — ABNORMAL LOW (ref 35.0–45.0)
pCO2 arterial: 42.4 mmHg (ref 35.0–45.0)
pH, Arterial: 7.337 — ABNORMAL LOW (ref 7.350–7.450)
pH, Arterial: 7.454 — ABNORMAL HIGH (ref 7.350–7.450)
pH, Arterial: 7.434 (ref 7.350–7.450)
pO2, Arterial: 236 mmHg — ABNORMAL HIGH (ref 80.0–100.0)
pO2, Arterial: 45 mmHg — ABNORMAL LOW (ref 80.0–100.0)
pO2, Arterial: 71 mmHg — ABNORMAL LOW (ref 80.0–100.0)

## 2012-08-01 LAB — BLOOD GAS, ARTERIAL
Bicarbonate: 21.8 mEq/L (ref 20.0–24.0)
O2 Saturation: 92.7 %
Patient temperature: 98.6
TCO2: 22.8 mmol/L (ref 0–100)

## 2012-08-01 LAB — CBC
HCT: 25.6 % — ABNORMAL LOW (ref 39.0–52.0)
Hemoglobin: 8.8 g/dL — ABNORMAL LOW (ref 13.0–17.0)
MCV: 91.8 fL (ref 78.0–100.0)
RBC: 2.79 MIL/uL — ABNORMAL LOW (ref 4.22–5.81)
WBC: 15.4 10*3/uL — ABNORMAL HIGH (ref 4.0–10.5)

## 2012-08-01 LAB — PROTIME-INR
INR: 1.57 — ABNORMAL HIGH (ref 0.00–1.49)
Prothrombin Time: 18.3 s — ABNORMAL HIGH (ref 11.6–15.2)

## 2012-08-01 LAB — APTT: aPTT: 37 seconds (ref 24–37)

## 2012-08-01 LAB — FIBRINOGEN: Fibrinogen: 352 mg/dL (ref 204–475)

## 2012-08-01 SURGERY — EXPLORATION, MEDIASTINUM
Anesthesia: General | Site: Chest | Wound class: Clean

## 2012-08-01 MED ORDER — DEXTROSE 5 % IV SOLN
750.0000 mg | INTRAVENOUS | Status: DC
  Filled 2012-08-01: qty 750

## 2012-08-01 MED ORDER — LACTATED RINGERS IV SOLN
INTRAVENOUS | Status: DC | PRN
  Administered 2012-08-01 (×2): via INTRAVENOUS

## 2012-08-01 MED ORDER — MICROFIBRILLAR COLL HEMOSTAT EX PADS
MEDICATED_PAD | CUTANEOUS | Status: DC | PRN
  Administered 2012-08-01: 1 via TOPICAL

## 2012-08-01 MED ORDER — HEPARIN SODIUM (PORCINE) 1000 UNIT/ML IJ SOLN
INTRAMUSCULAR | Status: DC | PRN
  Administered 2012-08-01: 30000 [IU] via INTRAVENOUS

## 2012-08-01 MED ORDER — THROMBIN 20000 UNITS EX KIT
PACK | CUTANEOUS | Status: DC | PRN
  Administered 2012-08-01: 20000 [IU] via TOPICAL

## 2012-08-01 MED ORDER — NITROGLYCERIN IN D5W 200-5 MCG/ML-% IV SOLN
2.0000 ug/min | INTRAVENOUS | Status: AC
  Administered 2012-08-01: 5 ug/min via INTRAVENOUS
  Filled 2012-08-01: qty 250

## 2012-08-01 MED ORDER — FENTANYL CITRATE 0.05 MG/ML IJ SOLN
INTRAMUSCULAR | Status: DC | PRN
  Administered 2012-08-01: 150 ug via INTRAVENOUS
  Administered 2012-08-01: 250 ug via INTRAVENOUS
  Administered 2012-08-01: 350 ug via INTRAVENOUS
  Administered 2012-08-01: 150 ug via INTRAVENOUS
  Administered 2012-08-01 (×2): 250 ug via INTRAVENOUS

## 2012-08-01 MED ORDER — LACTATED RINGERS IV SOLN
INTRAVENOUS | Status: DC | PRN
  Administered 2012-08-01: 15:00:00 via INTRAVENOUS

## 2012-08-01 MED ORDER — PLASMA-LYTE 148 IV SOLN
INTRAVENOUS | Status: AC
  Administered 2012-08-01: 17:00:00
  Filled 2012-08-01 (×2): qty 2.5

## 2012-08-01 MED ORDER — THROMBIN 20000 UNITS EX SOLR
OROMUCOSAL | Status: DC | PRN
  Administered 2012-08-01: 17:00:00 via TOPICAL

## 2012-08-01 MED ORDER — HEMOSTATIC AGENTS (NO CHARGE) OPTIME
TOPICAL | Status: DC | PRN
  Administered 2012-08-01 (×3): 1 via TOPICAL

## 2012-08-01 MED ORDER — PHENYLEPHRINE HCL 10 MG/ML IJ SOLN
30.0000 ug/min | INTRAVENOUS | Status: DC
  Filled 2012-08-01: qty 1

## 2012-08-01 MED ORDER — DEXMEDETOMIDINE HCL IN NACL 400 MCG/100ML IV SOLN
0.1000 ug/kg/h | INTRAVENOUS | Status: AC
  Administered 2012-08-02: 0.7 ug/kg/h via INTRAVENOUS
  Filled 2012-08-01 (×2): qty 100

## 2012-08-01 MED ORDER — EPINEPHRINE HCL 1 MG/ML IJ SOLN
0.5000 ug/min | INTRAVENOUS | Status: AC
  Filled 2012-08-01: qty 4

## 2012-08-01 MED ORDER — NOREPINEPHRINE BITARTRATE 1 MG/ML IJ SOLN
2.0000 ug/min | Freq: Once | INTRAVENOUS | Status: AC
  Administered 2012-08-02: 1 ug/min via INTRAVENOUS
  Filled 2012-08-01: qty 4

## 2012-08-01 MED ORDER — SODIUM CHLORIDE 0.9 % IV SOLN
0.5000 g/h | Freq: Once | INTRAVENOUS | Status: DC
  Filled 2012-08-01: qty 20

## 2012-08-01 MED ORDER — SODIUM CHLORIDE 0.9 % IV SOLN
INTRAVENOUS | Status: DC
  Filled 2012-08-01: qty 30

## 2012-08-01 MED ORDER — MIDAZOLAM HCL 5 MG/5ML IJ SOLN
INTRAMUSCULAR | Status: DC | PRN
  Administered 2012-08-01: 2 mg via INTRAVENOUS
  Administered 2012-08-01: 4 mg via INTRAVENOUS
  Administered 2012-08-01: 2 mg via INTRAVENOUS
  Administered 2012-08-01: 6 mg via INTRAVENOUS
  Administered 2012-08-01 (×3): 2 mg via INTRAVENOUS

## 2012-08-01 MED ORDER — LACTATED RINGERS IV SOLN
INTRAVENOUS | Status: DC | PRN
  Administered 2012-08-01 (×2): via INTRAVENOUS

## 2012-08-01 MED ORDER — POTASSIUM CHLORIDE 2 MEQ/ML IV SOLN
80.0000 meq | INTRAVENOUS | Status: DC
Start: 2012-08-01 — End: 2012-08-02
  Filled 2012-08-01: qty 40

## 2012-08-01 MED ORDER — PROTAMINE SULFATE 10 MG/ML IV SOLN
INTRAVENOUS | Status: DC | PRN
  Administered 2012-08-01: 20 mg via INTRAVENOUS
  Administered 2012-08-01: 260 mg via INTRAVENOUS

## 2012-08-01 MED ORDER — DEXTROSE 5 % IV SOLN
1.5000 g | INTRAVENOUS | Status: AC
  Administered 2012-08-01: 750 mg via INTRAVENOUS
  Filled 2012-08-01: qty 1.5

## 2012-08-01 MED ORDER — FUROSEMIDE 10 MG/ML IJ SOLN
INTRAMUSCULAR | Status: DC | PRN
  Administered 2012-08-01: 40 mg via INTRAMUSCULAR

## 2012-08-01 MED ORDER — DEXMEDETOMIDINE HCL IN NACL 400 MCG/100ML IV SOLN
0.1000 ug/kg/h | INTRAVENOUS | Status: AC
  Administered 2012-08-01: 0.3 ug/kg/h via INTRAVENOUS
  Filled 2012-08-01: qty 100

## 2012-08-01 MED ORDER — MAGNESIUM SULFATE 50 % IJ SOLN
40.0000 meq | INTRAMUSCULAR | Status: DC
  Filled 2012-08-01: qty 10

## 2012-08-01 MED ORDER — DOPAMINE-DEXTROSE 3.2-5 MG/ML-% IV SOLN
2.0000 ug/kg/min | INTRAVENOUS | Status: AC
  Administered 2012-08-01: 3 ug/kg/min via INTRAVENOUS
  Filled 2012-08-01: qty 250

## 2012-08-01 MED ORDER — ALBUMIN HUMAN 5 % IV SOLN: 12.5000 g | Freq: Once | INTRAVENOUS | Status: DC

## 2012-08-01 MED ORDER — DEXTROSE 5 % IV SOLN
1.5000 g | INTRAVENOUS | Status: DC | PRN
  Administered 2012-08-01: 1.5 g via INTRAVENOUS

## 2012-08-01 MED ORDER — MAGNESIUM SULFATE 40 MG/ML IJ SOLN
INTRAMUSCULAR | Status: AC
  Filled 2012-08-01: qty 100

## 2012-08-01 MED ORDER — SODIUM CHLORIDE 0.9 % IV SOLN
INTRAVENOUS | Status: AC
  Administered 2012-08-01: 3.4 [IU]/h via INTRAVENOUS
  Filled 2012-08-01: qty 1

## 2012-08-01 MED ORDER — IOHEXOL 350 MG/ML SOLN
100.0000 mL | Freq: Once | INTRAVENOUS | Status: AC | PRN
  Administered 2012-08-01: 100 mL via INTRAVENOUS

## 2012-08-01 MED ORDER — PHENYLEPHRINE HCL 10 MG/ML IJ SOLN
30.0000 ug/min | INTRAVENOUS | Status: DC
  Filled 2012-08-01: qty 2

## 2012-08-01 MED ORDER — ALBUMIN HUMAN 5 % IV SOLN
INTRAVENOUS | Status: AC
  Administered 2012-08-02: 12.5 g
  Filled 2012-08-01: qty 250

## 2012-08-01 MED ORDER — VANCOMYCIN HCL 10 G IV SOLR
1500.0000 mg | INTRAVENOUS | Status: AC
  Administered 2012-08-01: 1500 mg via INTRAVENOUS
  Filled 2012-08-01: qty 1500

## 2012-08-01 MED ORDER — SODIUM CHLORIDE 0.9 % IV SOLN
INTRAVENOUS | Status: DC
  Administered 2012-08-01: 13:00:00 via INTRAVENOUS

## 2012-08-01 MED ORDER — FUROSEMIDE 10 MG/ML IJ SOLN
INTRAMUSCULAR | Status: AC
  Filled 2012-08-01: qty 4

## 2012-08-01 MED ORDER — SODIUM CHLORIDE 0.9 % IV SOLN
INTRAVENOUS | Status: AC
  Administered 2012-08-01: 70 mL/h via INTRAVENOUS
  Filled 2012-08-01: qty 40

## 2012-08-01 MED ORDER — 0.9 % SODIUM CHLORIDE (POUR BTL) OPTIME
TOPICAL | Status: DC | PRN
  Administered 2012-08-01: 6000 mL

## 2012-08-01 MED ORDER — COAGULATION FACTOR VIIA RECOMB 1 MG IV SOLR
90.0000 ug/kg | Freq: Once | INTRAVENOUS | Status: AC
  Administered 2012-08-01: 8000 ug via INTRAVENOUS
  Filled 2012-08-01: qty 8

## 2012-08-01 MED ORDER — ALBUMIN HUMAN 5 % IV SOLN
INTRAVENOUS | Status: AC
  Administered 2012-08-01: 12.5 g
  Filled 2012-08-01: qty 250

## 2012-08-01 MED ORDER — THROMBIN 20000 UNITS EX SOLR
CUTANEOUS | Status: AC
  Filled 2012-08-01: qty 20000

## 2012-08-01 MED ORDER — SODIUM CHLORIDE 0.9 % IV BOLUS (SEPSIS): 250.0000 mL | Freq: Once | INTRAVENOUS | Status: DC

## 2012-08-01 MED ORDER — PROPOFOL 10 MG/ML IV BOLUS
INTRAVENOUS | Status: DC | PRN
  Administered 2012-08-01 (×2): 90 mg via INTRAVENOUS

## 2012-08-01 MED ORDER — ROCURONIUM BROMIDE 100 MG/10ML IV SOLN
INTRAVENOUS | Status: DC | PRN
  Administered 2012-08-01 (×3): 30 mg via INTRAVENOUS
  Administered 2012-08-01: 70 mg via INTRAVENOUS
  Administered 2012-08-01: 30 mg via INTRAVENOUS

## 2012-08-01 MED ORDER — ETOMIDATE 2 MG/ML IV SOLN
INTRAVENOUS | Status: DC | PRN
  Administered 2012-08-01: 20 mg via INTRAVENOUS

## 2012-08-01 SURGICAL SUPPLY — 119 items
ADAPTER CARDIO PERF ANTE/RETRO (ADAPTER) ×2 IMPLANT
ATTRACTOMAT 16X20 MAGNETIC DRP (DRAPES) ×2 IMPLANT
BAG DECANTER FOR FLEXI CONT (MISCELLANEOUS) ×2 IMPLANT
BALLN LINEAR 7.5FR IABP 40CC (BALLOONS) ×4
BALLOON LINEAR 7.5FR IABP 40CC (BALLOONS) ×2 IMPLANT
BANDAGE ELASTIC 4 VELCRO ST LF (GAUZE/BANDAGES/DRESSINGS) IMPLANT
BANDAGE ELASTIC 6 VELCRO ST LF (GAUZE/BANDAGES/DRESSINGS) IMPLANT
BANDAGE GAUZE ELAST BULKY 4 IN (GAUZE/BANDAGES/DRESSINGS) IMPLANT
BASKET HEART (ORDER IN 25'S) (MISCELLANEOUS)
BASKET HEART (ORDER IN 25S) (MISCELLANEOUS) IMPLANT
BLADE STERNUM SYSTEM 6 (BLADE) IMPLANT
CANISTER SUCTION 2500CC (MISCELLANEOUS) ×2 IMPLANT
CANNULA AORTIC HI-FLOW 6.5M20F (CANNULA) ×2 IMPLANT
CANNULA GUNDRY RCSP 15FR (MISCELLANEOUS) ×2 IMPLANT
CANNULA VENOUS LOW PROF 34X46 (CANNULA) ×2 IMPLANT
CATH ROBINSON RED A/P 18FR (CATHETERS) ×8 IMPLANT
CATH THORACIC 28FR (CATHETERS) ×4 IMPLANT
CATH THORACIC 28FR RT ANG (CATHETERS) IMPLANT
CATH THORACIC 36FR (CATHETERS) ×2 IMPLANT
CATH THORACIC 36FR RT ANG (CATHETERS) ×2 IMPLANT
CATH/SQUID NICHOLS JEHLE COR (CATHETERS) ×2 IMPLANT
CLIP TI MEDIUM 24 (CLIP) IMPLANT
CLIP TI WIDE RED SMALL 24 (CLIP) ×2 IMPLANT
CLOTH BEACON ORANGE TIMEOUT ST (SAFETY) ×2 IMPLANT
CLSR STERI-STRIP ANTIMIC 1/2X4 (GAUZE/BANDAGES/DRESSINGS) ×2 IMPLANT
CONN ST 1/4X3/8  BEN (MISCELLANEOUS) ×1
CONN ST 1/4X3/8 BEN (MISCELLANEOUS) ×1 IMPLANT
COVER SURGICAL LIGHT HANDLE (MISCELLANEOUS) ×2 IMPLANT
CRADLE DONUT ADULT HEAD (MISCELLANEOUS) ×2 IMPLANT
DRAPE CARDIOVASCULAR INCISE (DRAPES) ×1
DRAPE PROXIMA HALF (DRAPES) ×2 IMPLANT
DRAPE SLUSH MACHINE 52X66 (DRAPES) IMPLANT
DRAPE SLUSH/WARMER DISC (DRAPES) ×2 IMPLANT
DRAPE SRG 135X102X78XABS (DRAPES) ×1 IMPLANT
DRSG COVADERM 4X14 (GAUZE/BANDAGES/DRESSINGS) ×2 IMPLANT
DRSG OPSITE POSTOP 4X6 (GAUZE/BANDAGES/DRESSINGS) ×2 IMPLANT
ELECT CAUTERY BLADE 6.4 (BLADE) ×2 IMPLANT
ELECT REM PT RETURN 9FT ADLT (ELECTROSURGICAL) ×4
ELECTRODE REM PT RTRN 9FT ADLT (ELECTROSURGICAL) ×2 IMPLANT
GLOVE BIO SURGEON STRL SZ 6 (GLOVE) ×6 IMPLANT
GLOVE BIO SURGEON STRL SZ 6.5 (GLOVE) ×12 IMPLANT
GLOVE BIO SURGEON STRL SZ7 (GLOVE) ×12 IMPLANT
GLOVE BIO SURGEON STRL SZ7.5 (GLOVE) IMPLANT
GLOVE BIOGEL PI IND STRL 6 (GLOVE) ×2 IMPLANT
GLOVE BIOGEL PI IND STRL 6.5 (GLOVE) ×2 IMPLANT
GLOVE BIOGEL PI IND STRL 7.0 (GLOVE) ×1 IMPLANT
GLOVE BIOGEL PI INDICATOR 6 (GLOVE) ×2
GLOVE BIOGEL PI INDICATOR 6.5 (GLOVE) ×2
GLOVE BIOGEL PI INDICATOR 7.0 (GLOVE) ×1
GLOVE EUDERMIC 7 POWDERFREE (GLOVE) ×4 IMPLANT
GLOVE ORTHO TXT STRL SZ7.5 (GLOVE) IMPLANT
GOWN PREVENTION PLUS XLARGE (GOWN DISPOSABLE) ×2 IMPLANT
GOWN STRL NON-REIN LRG LVL3 (GOWN DISPOSABLE) ×12 IMPLANT
GRAFT GELWEAVE IMPREG 8X30CM (Prosthesis & Implant Heart) ×2 IMPLANT
HEMOSTAT POWDER SURGIFOAM 1G (HEMOSTASIS) ×6 IMPLANT
HEMOSTAT SURGICEL 2X14 (HEMOSTASIS) ×6 IMPLANT
INSERT FOGARTY 61MM (MISCELLANEOUS) IMPLANT
INSERT FOGARTY XLG (MISCELLANEOUS) ×2 IMPLANT
KIT BASIN OR (CUSTOM PROCEDURE TRAY) ×2 IMPLANT
KIT CATH CPB BARTLE (MISCELLANEOUS) ×2 IMPLANT
KIT DILATOR VASC 18G NDL (KITS) ×2 IMPLANT
KIT ROOM TURNOVER OR (KITS) ×2 IMPLANT
KIT SUCTION CATH 14FR (SUCTIONS) ×2 IMPLANT
KIT VASOVIEW W/TROCAR VH 2000 (KITS) IMPLANT
LINE VENT (MISCELLANEOUS) ×4 IMPLANT
MARKER GRAFT CORONARY BYPASS (MISCELLANEOUS) ×2 IMPLANT
NS IRRIG 1000ML POUR BTL (IV SOLUTION) ×12 IMPLANT
PACK OPEN HEART (CUSTOM PROCEDURE TRAY) ×2 IMPLANT
PAD ARMBOARD 7.5X6 YLW CONV (MISCELLANEOUS) ×4 IMPLANT
PENCIL BUTTON HOLSTER BLD 10FT (ELECTRODE) ×2 IMPLANT
PUNCH AORTIC ROTATE 4.0MM (MISCELLANEOUS) IMPLANT
PUNCH AORTIC ROTATE 4.5MM 8IN (MISCELLANEOUS) ×2 IMPLANT
PUNCH AORTIC ROTATE 5MM 8IN (MISCELLANEOUS) IMPLANT
SEALANT SURG COSEAL 8ML (VASCULAR PRODUCTS) ×2 IMPLANT
SET CARDIOPLEGIA MPS 5001102 (MISCELLANEOUS) ×2 IMPLANT
SPONGE GAUZE 4X4 12PLY (GAUZE/BANDAGES/DRESSINGS) ×2 IMPLANT
SPONGE INTESTINAL PEANUT (DISPOSABLE) IMPLANT
SPONGE LAP 18X18 X RAY DECT (DISPOSABLE) ×10 IMPLANT
SPONGE LAP 4X18 X RAY DECT (DISPOSABLE) ×2 IMPLANT
STOPCOCK 4 WAY LG BORE MALE ST (IV SETS) ×2 IMPLANT
SUCKER INTRACARDIAC WEIGHTED (SUCKER) ×2 IMPLANT
SUT BONE WAX W31G (SUTURE) ×2 IMPLANT
SUT ETHIBOND 2 0 SH (SUTURE) ×1
SUT ETHIBOND 2 0 SH 36X2 (SUTURE) ×1 IMPLANT
SUT MNCRL AB 4-0 PS2 18 (SUTURE) IMPLANT
SUT PROLENE 3 0 SH DA (SUTURE) ×6 IMPLANT
SUT PROLENE 3 0 SH1 36 (SUTURE) ×16 IMPLANT
SUT PROLENE 4 0 RB 1 (SUTURE) ×3
SUT PROLENE 4 0 SH DA (SUTURE) IMPLANT
SUT PROLENE 4-0 RB1 .5 CRCL 36 (SUTURE) ×3 IMPLANT
SUT PROLENE 5 0 C 1 36 (SUTURE) ×2 IMPLANT
SUT PROLENE 6 0 C 1 30 (SUTURE) IMPLANT
SUT PROLENE 7 0 BV 1 (SUTURE) IMPLANT
SUT PROLENE 7 0 BV1 MDA (SUTURE) ×2 IMPLANT
SUT PROLENE 8 0 BV175 6 (SUTURE) IMPLANT
SUT SILK  1 MH (SUTURE) ×1
SUT SILK 1 MH (SUTURE) ×1 IMPLANT
SUT STEEL 4 0 V 26 (SUTURE) ×6 IMPLANT
SUT STEEL STERNAL CCS#1 18IN (SUTURE) IMPLANT
SUT STEEL SZ 6 DBL 3X14 BALL (SUTURE) IMPLANT
SUT VIC AB 1 CTX 36 (SUTURE) ×2
SUT VIC AB 1 CTX36XBRD ANBCTR (SUTURE) ×2 IMPLANT
SUT VIC AB 2-0 CT1 27 (SUTURE) ×1
SUT VIC AB 2-0 CT1 TAPERPNT 27 (SUTURE) ×1 IMPLANT
SUT VIC AB 2-0 CTX 27 (SUTURE) IMPLANT
SUT VIC AB 3-0 SH 27 (SUTURE)
SUT VIC AB 3-0 SH 27X BRD (SUTURE) IMPLANT
SUT VIC AB 3-0 X1 27 (SUTURE) ×2 IMPLANT
SUT VICRYL 4-0 PS2 18IN ABS (SUTURE) IMPLANT
SUTURE E-PAK OPEN HEART (SUTURE) ×2 IMPLANT
SYSTEM SAHARA CHEST DRAIN ATS (WOUND CARE) ×4 IMPLANT
TAPE CLOTH SURG 4X10 WHT LF (GAUZE/BANDAGES/DRESSINGS) ×2 IMPLANT
TOWEL OR 17X24 6PK STRL BLUE (TOWEL DISPOSABLE) ×4 IMPLANT
TOWEL OR 17X26 10 PK STRL BLUE (TOWEL DISPOSABLE) ×4 IMPLANT
TRAY FOLEY IC TEMP SENS 14FR (CATHETERS) ×2 IMPLANT
TUBE SUCT INTRACARD DLP 20F (MISCELLANEOUS) ×4 IMPLANT
TUBING INSUFFLATION 10FT LAP (TUBING) IMPLANT
UNDERPAD 30X30 INCONTINENT (UNDERPADS AND DIAPERS) ×2 IMPLANT
WATER STERILE IRR 1000ML POUR (IV SOLUTION) ×4 IMPLANT

## 2012-08-01 NOTE — Anesthesia Procedure Notes (Signed)
Procedure Name: Intubation Date/Time: 08/01/2012 3:53 PM Performed by: Coralee Rud Pre-anesthesia Checklist: Patient identified, Emergency Drugs available, Suction available and Patient being monitored Patient Re-evaluated:Patient Re-evaluated prior to inductionOxygen Delivery Method: Circle system utilized Preoxygenation: Pre-oxygenation with 100% oxygen Intubation Type: IV induction Ventilation: Mask ventilation without difficulty Laryngoscope Size: Miller and 3 Grade View: Grade I Tube type: Oral Laser Tube: Laser Tube Tube size: 8.0 mm Number of attempts: 1 Airway Equipment and Method: Stylet

## 2012-08-01 NOTE — Progress Notes (Signed)
  CT scan reviewed with radiology. There is no PE and no aortic dissection. There is a large hematoma/fluid collection around the ascending graft as well as around the heart circumferentially. There is no active extravasation seen. I suspect there is an element of tamponade responsible for hypotension. His Hgb was 9.2 yesterday am and 8.8 this afternoon so not much change. I will take patient to the OR for exploration and drainage. Procedure, alternative, benefits and risks discussed with the patient and wife and they agree to proceed.

## 2012-08-01 NOTE — Anesthesia Preprocedure Evaluation (Signed)
Anesthesia Evaluation  Patient identified by MRN, date of birth, ID band Patient unresponsive    Reviewed: Unable to perform ROS - Chart review only  Airway       Dental   Pulmonary          Cardiovascular hypertension, + Peripheral Vascular Disease + Valvular Problems/Murmurs     Neuro/Psych    GI/Hepatic (+) Hepatitis -  Endo/Other    Renal/GU      Musculoskeletal   Abdominal   Peds  Hematology   Anesthesia Other Findings   Reproductive/Obstetrics                           Anesthesia Physical Anesthesia Plan  ASA: IV and emergent  Anesthesia Plan: General   Post-op Pain Management:    Induction: Intravenous  Airway Management Planned: Oral ETT  Additional Equipment:   Intra-op Plan:   Post-operative Plan: Post-operative intubation/ventilation  Informed Consent:   Plan Discussed with: CRNA, Anesthesiologist and Surgeon  Anesthesia Plan Comments:         Anesthesia Quick Evaluation

## 2012-08-01 NOTE — Significant Event (Addendum)
Rapid Response Event Note  Overview: called for low BP Time Called: 1216 Arrival Time: 1217 Event Type: Cardiac;Respiratory  Initial Focused Assessment:  Upon arrival to patients room Rn and family at bedside.  Patient in bed, cold, clammy, diaphretic and pale in appearance on [place] a 2 lpm.  EKG being done. BP 91/61 on my arrival then 66/41.  Wayne,PA notified and at bedside.  Patient complains of right shoulder pain that runs across his shoulders.  Patient also c/o nausea.  Oxygen increased to 6 lpm nasal cannula.  Dr. Laneta Simmers at bedside   Interventions:PCXR, ABG and labs done. 250 cc NS bolus given, then at 125 cc/hr. Patient transported to 2309 at 1330 via bed on zoll monitor   Event Summary:   at      at          [facility]

## 2012-08-01 NOTE — Progress Notes (Addendum)
3 Days Post-Op Procedure(s) (LRB): BENTALL PROCEDURE (N/A) INTRAOPERATIVE TRANSESOPHAGEAL ECHOCARDIOGRAM (N/A) Subjective: Had acute right shoulder pain radiating into arm associated with diaphoresis, some increased SOB, hypotension with BP to 60's  Objective: Vital signs in last 24 hours: Temp:  [98 F (36.7 C)-99 F (37.2 C)] 98.9 F (37.2 C) (05/01 0500) Pulse Rate:  [80-101] 87 (05/01 1228) Cardiac Rhythm:  [-] Normal sinus rhythm (05/01 0928) Resp:  [18] 18 (05/01 0500) BP: (66-130)/(41-67) 66/41 mmHg (05/01 1228) SpO2:  [91 %-94 %] 94 % (05/01 1228) Weight:  [206 lb 9.1 oz (93.7 kg)] 206 lb 9.1 oz (93.7 kg) (05/01 0500)  Hemodynamic parameters for last 24 hours:    Intake/Output from previous day: 04/30 0701 - 05/01 0700 In: 436.8 [P.O.:240; I.V.:146.8; IV Piggyback:50] Out: 250 [Urine:250] Intake/Output this shift: Total I/O In: 360 [P.O.:360] Out: -   General appearance: cooperative, fatigued and diaphoretic Neurologic: intact Heart: regular rate and rhythm, no rub and systolic murmur Lungs: mildly dim in bases Abdomen: soft, nontender Extremities: + LE edema, mild  Lab Results:  Recent Labs  07/30/12 1700 07/30/12 1715 07/31/12 0410  WBC 15.9*  --  13.3*  HGB 9.6* 9.5* 9.2*  HCT 26.8* 28.0* 26.3*  PLT 120*  --  94*   BMET:  Recent Labs  07/30/12 0445  07/30/12 1715 07/31/12 0410  NA 138  --  138 136  K 4.7  --  4.7 4.3  CL 108  --  102 103  CO2 23  --   --  28  GLUCOSE 140*  --  136* 122*  BUN 22  --  20 20  CREATININE 0.86  < > 0.80 0.98  CALCIUM 7.5*  --   --  7.9*  < > = values in this interval not displayed.  PT/INR:  Recent Labs  07/29/12 1552  LABPROT 17.0*  INR 1.42   ABG    Component Value Date/Time   PHART 7.470* 08/01/2012 1239   HCO3 21.8 08/01/2012 1239   TCO2 22.8 08/01/2012 1239   ACIDBASEDEF 1.3 08/01/2012 1239   O2SAT 92.7 08/01/2012 1239   CBG (last 3)   Recent Labs  07/31/12 0809 07/31/12 1213 07/31/12 2147   GLUCAP 155* 138* 118*    Assessment/Plan: S/P Procedure(s) (LRB): BENTALL PROCEDURE (N/A) INTRAOPERATIVE TRANSESOPHAGEAL ECHOCARDIOGRAM (N/A)  1 obtained EKG, ABG, CBC, PCXR 2 Placed nasal canula 6 l 3 250 cc saline bolus, then 125 /hr with bp now in 90's syst 4 CT angio to R/O PE and evaluate chest/ grafts Tx 2300 as precaution '    LOS: 3 days    GOLD,WAYNE E 08/01/2012   Chart reviewed, patient examined, agree with above. He was doing well this am and up in chair eating when he developed severe pain across upper back and shoulders and down right side of chest. He became diaphoretic and was hypotensive, responding to some IVF. Physical exam only significant for decreased breath sounds in bases. Radial pulses equal. Femoral pulses equal. ECG is sinus with RBBB which is unchanged. There are no acute ischemic symptoms. Pulse is 80's in sinus. PO2 is only 61 on 3L. CXR shows low lung volumes, small effusions. Cardiac silhouette is large but he is postop and has low lung volumes. He is going for CT pulmonary angio to r/o PE, although his symptoms are a little atypical. This will also show if there is a significant pericardial effusion and hopefully assess descending aorta for dissection.

## 2012-08-01 NOTE — Progress Notes (Signed)
1056 Pt sleeping now. Walked with wife earlier. Will follow up after lunch. [name] DunlapRNBSN

## 2012-08-01 NOTE — Progress Notes (Signed)
Pt ambulated in hallway with wife using front wheel walker. Tolerated well, back in bed resting.

## 2012-08-01 NOTE — Progress Notes (Signed)
3 Days Post-Op  Procedure(s) (LRB): BENTALL PROCEDURE (N/A) INTRAOPERATIVE TRANSESOPHAGEAL ECHOCARDIOGRAM (N/A) Subjective: Looks and feels well, has some appetite now, + BM, pain controlled, not SOB  Objective  Telemetry sinus rhythm/ sinus tachy  Temp:  [98 F (36.7 C)-99 F (37.2 C)] 98.9 F (37.2 C) (05/01 0500) Pulse Rate:  [72-85] 85 (05/01 0500) Resp:  [18-24] 18 (05/01 0500) BP: (99-130)/(54-67) 99/67 mmHg (05/01 0500) SpO2:  [92 %-95 %] 92 % (05/01 0500) Weight:  [206 lb 9.1 oz (93.7 kg)] 206 lb 9.1 oz (93.7 kg) (05/01 0500)   Intake/Output Summary (Last 24 hours) at 08/01/12 0736 Last data filed at 07/31/12 1200  Gross per 24 hour  Intake  436.8 ml  Output    250 ml  Net  186.8 ml       General appearance: alert, cooperative and no distress Heart: regular rate and rhythm and soft systolic murmur Lungs: dim in bases Abdomen: soft, nontender Extremities: min edema Wound: healing well  Lab Results:  Recent Labs  07/30/12 0445 07/30/12 1700 07/30/12 1715 07/31/12 0410  NA 138  --  138 136  K 4.7  --  4.7 4.3  CL 108  --  102 103  CO2 23  --   --  28  GLUCOSE 140*  --  136* 122*  BUN 22  --  20 20  CREATININE 0.86 0.86 0.80 0.98  CALCIUM 7.5*  --   --  7.9*  MG 2.4 2.2  --   --    No results found for this basename: AST, ALT, ALKPHOS, BILITOT, PROT, ALBUMIN,  in the last 72 hours No results found for this basename: LIPASE, AMYLASE,  in the last 72 hours  Recent Labs  07/30/12 1700 07/30/12 1715 07/31/12 0410  WBC 15.9*  --  13.3*  HGB 9.6* 9.5* 9.2*  HCT 26.8* 28.0* 26.3*  MCV 90.5  --  92.0  PLT 120*  --  94*   No results found for this basename: CKTOTAL, CKMB, TROPONINI,  in the last 72 hours No components found with this basename: POCBNP,  No results found for this basename: DDIMER,  in the last 72 hours No results found for this basename: HGBA1C,  in the last 72 hours No results found for this basename: CHOL, HDL, LDLCALC, TRIG,  CHOLHDL,  in the last 72 hours No results found for this basename: TSH, T4TOTAL, FREET3, T3FREE, THYROIDAB,  in the last 72 hours No results found for this basename: VITAMINB12, FOLATE, FERRITIN, TIBC, IRON, RETICCTPCT,  in the last 72 hours  Medications: Scheduled . amiodarone  400 mg Oral BID  . aspirin EC  325 mg Oral Daily  . buPROPion  100 mg Oral BID  . docusate sodium  200 mg Oral Daily  . famotidine  20 mg Oral BID  . finasteride  5 mg Oral Daily  . furosemide  40 mg Oral Daily  . potassium chloride  40 mEq Oral Daily  . simvastatin  20 mg Oral QHS  . sodium chloride  3 mL Intravenous Q12H     Radiology/Studies:  Dg Chest Port 1 View  07/31/2012  *RADIOLOGY REPORT*  Clinical Data: Status post cardiac surgery  PORTABLE CHEST - 1 VIEW  Comparison: July 30, 2012.  Findings: Right internal jugular Swan-Ganz catheter has been removed.  The venous sheath remains.  Mild cardiomegaly is noted. Sternotomy wires are noted.  No pneumothorax is seen.  Mild left basilar opacity is noted most consistent with atelectasis with  associated pleural effusion.  Right lung is clear.  IMPRESSION: Mild left basilar opacity is noted consistent with subsegmental atelectasis with associated pleural effusion.  No pneumothorax is noted.   Original Report Authenticated By: Lupita Raider.,  M.D.     INR: Will add last result for INR, ABG once components are confirmed Will add last 4 CBG results once components are confirmed  Assessment/Plan: S/P Procedure(s) (LRB): BENTALL PROCEDURE (N/A) INTRAOPERATIVE TRANSESOPHAGEAL ECHOCARDIOGRAM (N/A) Mobilize Diuresis cbg's controlled pulm toilet mobilize Rhythm stable No new labs- recheck in am  LOS: 3 days    GOLD,WAYNE E 5/1/20147:36 AM

## 2012-08-01 NOTE — Progress Notes (Signed)
Pt's wife called RN to make aware pt was having sharp, shooting right sided shoulder pain radiating down to right flank. Upon assessment pt was clammy, extremely diaphoretic, and clutching right side rating pain 10/10. Pt stated he felt nauseous and "out of control". V/S obtained, normal, 02 sat slightly decreased at 91% RA. Charge RN called, EKG machine obtained. Pt assisted from chair to bed where BP began to drop steadily. Rapid RN called. CBG obtained, 120. Unable to obtain EKG r/t diaphoresis. Pt appeared to have a glazed look and not responding to verbal commands. Rapid RN at bedside. Pt alert and oriented x's 4 at this point after brief moment of not responding. Gershon Crane, [place] notified and Dr. Laneta Simmers notified. Orders obtained. PA at bedside within minutes. Pt's wife at bedside, educated on plan of care for husband.  Pt maintained orientation and BP began stabilizing after 250 bolus and NS started. Pt transferred to 2300. Report called to Liborio Nixon, Charity fundraiser.

## 2012-08-02 ENCOUNTER — Inpatient Hospital Stay (HOSPITAL_COMMUNITY): Payer: Medicare Other

## 2012-08-02 LAB — TYPE AND SCREEN
Antibody Screen: NEGATIVE
Unit division: 0
Unit division: 0
Unit division: 0

## 2012-08-02 LAB — CBC
HCT: 26.1 % — ABNORMAL LOW (ref 39.0–52.0)
HCT: 28 % — ABNORMAL LOW (ref 39.0–52.0)
Hemoglobin: 10.3 g/dL — ABNORMAL LOW (ref 13.0–17.0)
Hemoglobin: 9.4 g/dL — ABNORMAL LOW (ref 13.0–17.0)
MCHC: 36 g/dL (ref 30.0–36.0)
MCV: 82.6 fL (ref 78.0–100.0)
MCV: 82.6 fL (ref 78.0–100.0)
MCV: 82.9 fL (ref 78.0–100.0)
Platelets: 143 10*3/uL — ABNORMAL LOW (ref 150–400)
RBC: 3.22 MIL/uL — ABNORMAL LOW (ref 4.22–5.81)
RBC: 3.39 MIL/uL — ABNORMAL LOW (ref 4.22–5.81)
RDW: 17.9 % — ABNORMAL HIGH (ref 11.5–15.5)
RDW: 17.4 % — ABNORMAL HIGH (ref 11.5–15.5)
WBC: 10.1 10*3/uL (ref 4.0–10.5)
WBC: 14.2 10*3/uL — ABNORMAL HIGH (ref 4.0–10.5)

## 2012-08-02 LAB — POCT I-STAT 4, (NA,K, GLUC, HGB,HCT)
HCT: 29 % — ABNORMAL LOW (ref 39.0–52.0)
Hemoglobin: 9.9 g/dL — ABNORMAL LOW (ref 13.0–17.0)

## 2012-08-02 LAB — PREPARE FRESH FROZEN PLASMA
Unit division: 0
Unit division: 0

## 2012-08-02 LAB — BASIC METABOLIC PANEL
BUN: 22 mg/dL (ref 6–23)
CO2: 27 mEq/L (ref 19–32)
Calcium: 7.8 mg/dL — ABNORMAL LOW (ref 8.4–10.5)
Chloride: 103 mEq/L (ref 96–112)
Creatinine, Ser: 1.11 mg/dL (ref 0.50–1.35)
Glucose, Bld: 123 mg/dL — ABNORMAL HIGH (ref 70–99)

## 2012-08-02 LAB — POCT I-STAT 3, ART BLOOD GAS (G3+)
Acid-Base Excess: 4 mmol/L — ABNORMAL HIGH (ref 0.0–2.0)
Bicarbonate: 26.8 mEq/L — ABNORMAL HIGH (ref 20.0–24.0)
Bicarbonate: 28.1 mEq/L — ABNORMAL HIGH (ref 20.0–24.0)
O2 Saturation: 100 %
O2 Saturation: 91 %
TCO2: 28 mmol/L (ref 0–100)
pCO2 arterial: 41.9 mmHg (ref 35.0–45.0)
pCO2 arterial: 43.4 mmHg (ref 35.0–45.0)
pO2, Arterial: 203 mmHg — ABNORMAL HIGH (ref 80.0–100.0)
pO2, Arterial: 65 mmHg — ABNORMAL LOW (ref 80.0–100.0)

## 2012-08-02 LAB — PREPARE PLATELET PHERESIS: Unit division: 0

## 2012-08-02 LAB — CARBOXYHEMOGLOBIN
Methemoglobin: 1.6 % — ABNORMAL HIGH (ref 0.0–1.5)
Total hemoglobin: 11.8 g/dL — ABNORMAL LOW (ref 13.5–18.0)

## 2012-08-02 LAB — PROTIME-INR: Prothrombin Time: 10.6 seconds — ABNORMAL LOW (ref 11.6–15.2)

## 2012-08-02 LAB — GLUCOSE, CAPILLARY
Glucose-Capillary: 97 mg/dL (ref 70–99)
Glucose-Capillary: 105 mg/dL — ABNORMAL HIGH (ref 70–99)
Glucose-Capillary: 98 mg/dL (ref 70–99)

## 2012-08-02 LAB — MAGNESIUM: Magnesium: 1.8 mg/dL (ref 1.5–2.5)

## 2012-08-02 LAB — CREATININE, SERUM
GFR calc Af Amer: >90 mL/min (ref >90)
GFR calc non Af Amer: 84 mL/min — ABNORMAL LOW (ref >90)

## 2012-08-02 MED ORDER — ACETAMINOPHEN 10 MG/ML IV SOLN
1000.0000 mg | Freq: Four times a day (QID) | INTRAVENOUS | Status: AC
  Administered 2012-08-02 – 2012-08-03 (×4): 1000 mg via INTRAVENOUS
  Filled 2012-08-02 (×5): qty 100

## 2012-08-02 MED ORDER — METOPROLOL TARTRATE 25 MG/10 ML ORAL SUSPENSION
12.5000 mg | Freq: Two times a day (BID) | ORAL | Status: DC
  Filled 2012-08-02 (×2): qty 5

## 2012-08-02 MED ORDER — ACETAMINOPHEN 500 MG PO TABS: 1000.0000 mg | ORAL_TABLET | Freq: Four times a day (QID) | ORAL | Status: DC

## 2012-08-02 MED ORDER — CHLORHEXIDINE GLUCONATE 0.12 % MT SOLN
15.0000 mL | Freq: Two times a day (BID) | OROMUCOSAL | Status: DC
  Administered 2012-08-02 – 2012-08-03 (×4): 15 mL via OROMUCOSAL
  Filled 2012-08-02 (×3): qty 15

## 2012-08-02 MED ORDER — DEXTROSE 5 % IV SOLN
1.5000 g | Freq: Two times a day (BID) | INTRAVENOUS | Status: AC
  Administered 2012-08-02 – 2012-08-03 (×4): 1.5 g via INTRAVENOUS
  Filled 2012-08-02 (×4): qty 1.5

## 2012-08-02 MED ORDER — MIDAZOLAM HCL 2 MG/2ML IJ SOLN
2.0000 mg | INTRAMUSCULAR | Status: DC | PRN
  Administered 2012-08-02 – 2012-08-03 (×14): 2 mg via INTRAVENOUS
  Filled 2012-08-02 (×17): qty 2

## 2012-08-02 MED ORDER — DOPAMINE-DEXTROSE 3.2-5 MG/ML-% IV SOLN
2.0000 ug/kg/min | INTRAVENOUS | Status: DC
  Filled 2012-08-02: qty 250

## 2012-08-02 MED ORDER — METOPROLOL TARTRATE 1 MG/ML IV SOLN: 2.5000 mg | INTRAVENOUS | Status: DC | PRN

## 2012-08-02 MED ORDER — POTASSIUM CHLORIDE 10 MEQ/50ML IV SOLN: 10.0000 meq | INTRAVENOUS | Status: AC

## 2012-08-02 MED ORDER — OXYCODONE HCL 5 MG PO TABS
5.0000 mg | ORAL_TABLET | ORAL | Status: DC | PRN
  Administered 2012-08-03 (×3): 5 mg via ORAL
  Administered 2012-08-04 – 2012-08-08 (×5): 10 mg via ORAL
  Filled 2012-08-02: qty 2
  Filled 2012-08-02: qty 1
  Filled 2012-08-02: qty 2
  Filled 2012-08-02 (×2): qty 1
  Filled 2012-08-02 (×3): qty 2

## 2012-08-02 MED ORDER — AMIODARONE HCL 200 MG PO TABS
200.0000 mg | ORAL_TABLET | Freq: Two times a day (BID) | ORAL | Status: DC
  Administered 2012-08-02 – 2012-08-05 (×8): 200 mg via ORAL
  Filled 2012-08-02 (×10): qty 1

## 2012-08-02 MED ORDER — MAGNESIUM SULFATE 40 MG/ML IJ SOLN
INTRAMUSCULAR | Status: AC
  Filled 2012-08-02: qty 100

## 2012-08-02 MED ORDER — FUROSEMIDE 10 MG/ML IJ SOLN
40.0000 mg | Freq: Once | INTRAMUSCULAR | Status: AC
  Administered 2012-08-02: 40 mg via INTRAVENOUS
  Filled 2012-08-02: qty 4

## 2012-08-02 MED ORDER — ONDANSETRON HCL 4 MG/2ML IJ SOLN
4.0000 mg | Freq: Four times a day (QID) | INTRAMUSCULAR | Status: DC | PRN
  Administered 2012-08-03 – 2012-08-06 (×8): 4 mg via INTRAVENOUS
  Filled 2012-08-02 (×8): qty 2

## 2012-08-02 MED ORDER — METOPROLOL TARTRATE 12.5 MG HALF TABLET
12.5000 mg | ORAL_TABLET | Freq: Two times a day (BID) | ORAL | Status: DC
  Filled 2012-08-02 (×2): qty 1

## 2012-08-02 MED ORDER — ALBUMIN HUMAN 5 % IV SOLN
250.0000 mL | INTRAVENOUS | Status: AC | PRN
  Administered 2012-08-02: 250 mL via INTRAVENOUS

## 2012-08-02 MED ORDER — POTASSIUM CHLORIDE 10 MEQ/50ML IV SOLN
10.0000 meq | INTRAVENOUS | Status: AC
  Administered 2012-08-02 (×3): 10 meq via INTRAVENOUS

## 2012-08-02 MED ORDER — LACTATED RINGERS IV SOLN: 500.0000 mL | Freq: Once | INTRAVENOUS | Status: AC | PRN

## 2012-08-02 MED ORDER — MORPHINE SULFATE 2 MG/ML IJ SOLN
2.0000 mg | INTRAMUSCULAR | Status: DC | PRN
  Administered 2012-08-02: 4 mg via INTRAVENOUS
  Administered 2012-08-02: 2 mg via INTRAVENOUS
  Administered 2012-08-02 (×3): 4 mg via INTRAVENOUS
  Administered 2012-08-02 – 2012-08-03 (×2): 2 mg via INTRAVENOUS
  Administered 2012-08-03 (×2): 4 mg via INTRAVENOUS
  Administered 2012-08-03: 2 mg via INTRAVENOUS
  Administered 2012-08-03: 4 mg via INTRAVENOUS
  Administered 2012-08-03: 2 mg via INTRAVENOUS
  Administered 2012-08-03 – 2012-08-04 (×2): 4 mg via INTRAVENOUS
  Filled 2012-08-02: qty 2
  Filled 2012-08-02: qty 1
  Filled 2012-08-02: qty 2
  Filled 2012-08-02: qty 1
  Filled 2012-08-02: qty 2
  Filled 2012-08-02: qty 1
  Filled 2012-08-02 (×5): qty 2
  Filled 2012-08-02: qty 1

## 2012-08-02 MED ORDER — SODIUM CHLORIDE 0.9 % IJ SOLN
3.0000 mL | Freq: Two times a day (BID) | INTRAMUSCULAR | Status: DC
  Administered 2012-08-03 – 2012-08-07 (×3): 3 mL via INTRAVENOUS

## 2012-08-02 MED ORDER — ASPIRIN EC 325 MG PO TBEC: 325.0000 mg | DELAYED_RELEASE_TABLET | Freq: Every day | ORAL | Status: DC

## 2012-08-02 MED ORDER — BISACODYL 5 MG PO TBEC
10.0000 mg | DELAYED_RELEASE_TABLET | Freq: Every day | ORAL | Status: DC
  Administered 2012-08-04 – 2012-08-07 (×4): 10 mg via ORAL
  Filled 2012-08-02: qty 2

## 2012-08-02 MED ORDER — MORPHINE SULFATE 2 MG/ML IJ SOLN
1.0000 mg | INTRAMUSCULAR | Status: AC | PRN
  Filled 2012-08-02: qty 1

## 2012-08-02 MED ORDER — FAMOTIDINE IN NACL 20-0.9 MG/50ML-% IV SOLN
20.0000 mg | Freq: Two times a day (BID) | INTRAVENOUS | Status: AC
  Administered 2012-08-02 (×2): 20 mg via INTRAVENOUS
  Filled 2012-08-02: qty 50

## 2012-08-02 MED ORDER — ACETAMINOPHEN 160 MG/5ML PO SOLN: 975.0000 mg | Freq: Four times a day (QID) | ORAL | Status: DC

## 2012-08-02 MED ORDER — ASPIRIN 81 MG PO CHEW: 324.0000 mg | CHEWABLE_TABLET | Freq: Every day | ORAL | Status: DC

## 2012-08-02 MED ORDER — PANTOPRAZOLE SODIUM 40 MG PO TBEC
40.0000 mg | DELAYED_RELEASE_TABLET | Freq: Every day | ORAL | Status: DC
  Administered 2012-08-04 – 2012-08-08 (×5): 40 mg via ORAL
  Filled 2012-08-02 (×5): qty 1

## 2012-08-02 MED ORDER — SODIUM CHLORIDE 0.9 % IV SOLN: INTRAVENOUS | Status: DC

## 2012-08-02 MED ORDER — BIOTENE DRY MOUTH MT LIQD
15.0000 mL | Freq: Four times a day (QID) | OROMUCOSAL | Status: DC
  Administered 2012-08-02 – 2012-08-03 (×4): 15 mL via OROMUCOSAL

## 2012-08-02 MED ORDER — DOCUSATE SODIUM 100 MG PO CAPS
200.0000 mg | ORAL_CAPSULE | Freq: Every day | ORAL | Status: DC
  Administered 2012-08-05 – 2012-08-08 (×4): 200 mg via ORAL
  Filled 2012-08-02 (×5): qty 2

## 2012-08-02 MED ORDER — VANCOMYCIN HCL IN DEXTROSE 1-5 GM/200ML-% IV SOLN
1000.0000 mg | Freq: Once | INTRAVENOUS | Status: AC
  Administered 2012-08-02: 1000 mg via INTRAVENOUS
  Filled 2012-08-02: qty 200

## 2012-08-02 MED ORDER — LACTATED RINGERS IV SOLN
INTRAVENOUS | Status: DC
  Administered 2012-08-03 – 2012-08-04 (×2): via INTRAVENOUS

## 2012-08-02 MED ORDER — SODIUM CHLORIDE 0.45 % IV SOLN
INTRAVENOUS | Status: DC
  Administered 2012-08-03: 07:00:00 via INTRAVENOUS

## 2012-08-02 MED ORDER — INSULIN ASPART 100 UNIT/ML ~~LOC~~ SOLN
0.0000 [IU] | SUBCUTANEOUS | Status: DC
  Administered 2012-08-04 – 2012-08-06 (×2): 2 [IU] via SUBCUTANEOUS

## 2012-08-02 MED ORDER — BISACODYL 10 MG RE SUPP
10.0000 mg | Freq: Every day | RECTAL | Status: DC
Start: 2012-08-03 — End: 2012-08-08
  Filled 2012-08-02: qty 1

## 2012-08-02 MED ORDER — PHENYLEPHRINE HCL 10 MG/ML IJ SOLN: 0.0000 ug/min | INTRAVENOUS | Status: DC

## 2012-08-02 MED ORDER — ACETAMINOPHEN 10 MG/ML IV SOLN
1000.0000 mg | Freq: Once | INTRAVENOUS | Status: AC
  Administered 2012-08-02: 1000 mg via INTRAVENOUS
  Filled 2012-08-02: qty 100

## 2012-08-02 MED ORDER — POTASSIUM CHLORIDE 10 MEQ/50ML IV SOLN
10.0000 meq | Freq: Once | INTRAVENOUS | Status: AC
  Administered 2012-08-02: 10 meq via INTRAVENOUS

## 2012-08-02 MED ORDER — DEXMEDETOMIDINE HCL IN NACL 200 MCG/50ML IV SOLN
0.1000 ug/kg/h | INTRAVENOUS | Status: DC
  Administered 2012-08-02 (×3): 0.7 ug/kg/h via INTRAVENOUS
  Administered 2012-08-02: 0.5 ug/kg/h via INTRAVENOUS
  Administered 2012-08-02 (×2): 0.7 ug/kg/h via INTRAVENOUS
  Administered 2012-08-03: 0.4 ug/kg/h via INTRAVENOUS
  Administered 2012-08-03 (×2): 0.7 ug/kg/h via INTRAVENOUS
  Filled 2012-08-02 (×8): qty 50

## 2012-08-02 MED ORDER — NITROGLYCERIN IN D5W 200-5 MCG/ML-% IV SOLN: 0.0000 ug/min | INTRAVENOUS | Status: DC

## 2012-08-02 MED ORDER — INSULIN REGULAR BOLUS VIA INFUSION
0.0000 [IU] | Freq: Three times a day (TID) | INTRAVENOUS | Status: DC
  Filled 2012-08-02: qty 10

## 2012-08-02 MED ORDER — NOREPINEPHRINE BITARTRATE 1 MG/ML IJ SOLN: 0.0000 ug/min | INTRAVENOUS | Status: DC

## 2012-08-02 MED ORDER — SODIUM CHLORIDE 0.9 % IV SOLN
INTRAVENOUS | Status: DC
  Administered 2012-08-03: 20:00:00 via INTRAVENOUS

## 2012-08-02 NOTE — Brief Op Note (Addendum)
07/29/2012 - 08/02/2012  12:11 AM  PATIENT:  [name] [name]  67 y.o. male  PRE-OPERATIVE DIAGNOSIS:  Bleeding  POST-OPERATIVE DIAGNOSIS:  Bleeding  PROCEDURE:  Procedure(s) with comments: MEDIASTINAL EXPLORATION (N/A) - Cardiopulmonary bypass.Repair of bleeding suture line between graft and aortic annulus.  SURGEON:  Surgeon(s) and Role:    * Alleen Borne, MD - Primary    * [name], MD - Assisting  PHYSICIAN ASSISTANT: none  ASSISTANTS: Caroline More, RNFA   ANESTHESIA:   general  EBL:  Total I/O In: 6822 [I.V.:4000; Blood:2822] Out: 5300 [Urine:3500; Blood:1800]   To SICU critical but stable.

## 2012-08-02 NOTE — Progress Notes (Signed)
Dr. Laneta Simmers paged and discussed pt unable to wean off Nitric Oxide, respiratory therapy at bedside, pt requiring 100% oxygen on ventilator.  Dr. Laneta Simmers advised to place pt back on nitric at this time.

## 2012-08-02 NOTE — Progress Notes (Signed)
Arterial sheath in Left groin removed as per MD order, pt stable, normal sheath pull. Pressure dressing in place, will continue to monitor closely. Pulses strong in LLE (dorsalas pedis).

## 2012-08-02 NOTE — Progress Notes (Signed)
1 Day Post-Op Procedure(s) (LRB): MEDIASTINAL EXPLORATION (N/A) Subjective: Intubated and sedated  Objective: Vital signs in last 24 hours: Temp:  [100.2 F (37.9 C)-101.5 F (38.6 C)] 100.2 F (37.9 C) (05/02 0700) Pulse Rate:  [80-101] 88 (05/02 0700) Cardiac Rhythm:  [-] Normal sinus rhythm (05/02 0700) Resp:  [11-32] 16 (05/02 0700) BP: (65-124)/(41-82) 112/75 mmHg (05/02 0700) SpO2:  [91 %-100 %] 96 % (05/02 0700) Arterial Line BP: (87-126)/(54-77) 117/67 mmHg (05/02 0700) FiO2 (%):  [59.6 %-100 %] 59.6 % (05/02 0700) Weight:  [102.6 kg (226 lb 3.1 oz)] 102.6 kg (226 lb 3.1 oz) (05/02 0500)  Hemodynamic parameters for last 24 hours: PAP: (25-37)/(14-25) 26/14 mmHg CO:  [4 L/min-4.8 L/min] 4.8 L/min CI:  [1.9 L/min/m2-2.2 L/min/m2] 2.2 L/min/m2  Intake/Output from previous day: 05/01 0701 - 05/02 0700 In: 13845.1 [P.O.:360; I.V.:9183.1; Blood:3172; NG/GT:30; IV Piggyback:1100] Out: 6560 [Urine:4350; Blood:1800; Chest Tube:410] Intake/Output this shift:    General appearance: sedated Neurologic: unable to assess yet Heart: regular rate and rhythm, S1, S2 normal, no murmur, click, rub or gallop Lungs: clear to auscultation bilaterally Extremities: edema moderate edema Wound: dressing dry.  Lab Results:  Recent Labs  08/02/12 0030 08/02/12 0500  WBC 14.2* 10.6*  HGB 10.3* 9.4*  HCT 28.0* 26.1*  PLT 136* 122*   BMET:  Recent Labs  07/31/12 0410  08/02/12 0010 08/02/12 0500  NA 136  < > 139 138  K 4.3  < > 4.2 4.3  CL 103  --   --  103  CO2 28  --   --  27  GLUCOSE 122*  < > 94 123*  BUN 20  --   --  22  CREATININE 0.98  --   --  1.11  CALCIUM 7.9*  --   --  7.8*  < > = values in this interval not displayed.  PT/INR:  Recent Labs  08/02/12 0030  LABPROT 10.6*  INR 0.75   ABG    Component Value Date/Time   PHART 7.420 08/02/2012 0514   HCO3 26.8* 08/02/2012 0514   TCO2 28 08/02/2012 0514   ACIDBASEDEF 2.0 08/01/2012 2122   O2SAT 100.0 08/02/2012 0514    CBG (last 3)   Recent Labs  08/02/12 0309 08/02/12 0413 08/02/12 0634  GLUCAP 97 105* 101*   CXR: mild central pulmonary congesion. Left base atel.  ECG:  NSR, RBBB  Assessment/Plan:  S/P Bentall and arch replacement S/p exploration for repair of bleeding site at annulus pod 3  He is hemodynamically stable. PO2 203 on 60% FiO2 so will wean NO and hopefully wean vent later today.  Continue diuresis   LOS: 4 days    [name] [name] K 08/02/2012

## 2012-08-02 NOTE — Transfer of Care (Signed)
Immediate Anesthesia Transfer of Care Note  Patient: [name] [name]  Procedure(s) Performed: Procedure(s) with comments: MEDIASTINAL EXPLORATION (N/A) - Repair of bleeding suture line.   Patient Location: SICU  Anesthesia Type:General  Level of Consciousness: Patient remains intubated per anesthesia plan  Airway & Oxygen Therapy: Patient remains intubated per anesthesia plan and Patient placed on Ventilator (see vital sign flow sheet for setting)  Post-op Assessment: Report given to PACU RN and Post -op Vital signs reviewed and stable  Post vital signs: Reviewed and stable  Complications: No apparent anesthesia complications

## 2012-08-02 NOTE — Progress Notes (Signed)
Patient ID: [name] [name], male   DOB: 10-Apr-1945, 67 y.o.   MRN: [id number]                   [address]            [place] [address]          [phone]     1 Day Post-Op Procedure(s) (LRB): MEDIASTINAL EXPLORATION (N/A)  Total Length of Stay:  LOS: 4 days  BP 123/69  Pulse 74  Temp(Src) 98.1 F (36.7 C) (Core (Comment))  Resp 16  Ht 6' (1.829 m)  Wt 226 lb 3.1 oz (102.6 kg)  BMI 30.67 kg/m2  SpO2 96%  .Intake/Output     05/01 0701 - 05/02 0700 05/02 0701 - 05/03 0700   P.O. 360    I.V. (mL/kg) 9183.1 (89.5) 731.1 (7.1)   Blood 3172    NG/GT 30    IV Piggyback 1100 100   Total Intake(mL/kg) 13845.1 (134.9) 831.1 (8.1)   Urine (mL/kg/hr) 4350 (1.8) 1970 (1.7)   Blood 1800 (0.7)    Chest Tube 410 (0.2) 60 (0.1)   Total Output 6560 2030   Net +7285.1 -1198.9          . sodium chloride 20 mL/hr at 08/02/12 1500  . sodium chloride 20 mL/hr at 08/02/12 1500  . dexmedetomidine 0.5 mcg/kg/hr (08/02/12 1619)  . DOPamine 2 mcg/kg/min (08/02/12 1500)  . lactated ringers 20 mL/hr at 08/02/12 1500  . nitroGLYCERIN Stopped (08/02/12 1000)  . phenylephrine (NEO-SYNEPHRINE) Adult infusion Stopped (08/02/12 0244)     Lab Results  Component Value Date   WBC 10.6* 08/02/2012   HGB 9.4* 08/02/2012   HCT 26.1* 08/02/2012   PLT 122* 08/02/2012   GLUCOSE 123* 08/02/2012   CHOL 138 01/17/2012   TRIG 63.0 01/17/2012   HDL 42.40 01/17/2012   LDLCALC 83 01/17/2012   ALT 19 07/24/2012   AST 20 07/24/2012   NA 138 08/02/2012   K 4.3 08/02/2012   CL 103 08/02/2012   CREATININE 1.11 08/02/2012   BUN 22 08/02/2012   CO2 27 08/02/2012   PSA 1.91 01/17/2012   INR 0.75 08/02/2012   HGBA1C 5.5 07/24/2012   Remains sedated on vent, unable to wean nitric oxide off today   Delight Ovens MD  Beeper [phone] Office [phone] 08/02/2012 6:01 PM

## 2012-08-02 NOTE — Progress Notes (Signed)
Evening labs late due to administration of KCL IV.
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
## 2012-08-02 NOTE — Progress Notes (Signed)
UR Completed.  Vangie Bicker .336 [id number] 08/02/2012

## 2012-08-02 NOTE — Progress Notes (Signed)
Dr Laneta Simmers requested that the ET tube be pulled back 2 CM.  ET tube was at 24@lip .  RT pulled ET tube back to 22@lip  and resecured with a tube holder.  RT will continue to monitor patient.

## 2012-08-02 NOTE — Anesthesia Postprocedure Evaluation (Signed)
  Anesthesia Post-op Note  Patient: [name] [name]  Procedure(s) Performed: Procedure(s) with comments: MEDIASTINAL EXPLORATION (N/A) - Repair of bleeding suture line.   Patient Location: ICU  Anesthesia Type:General  Level of Consciousness: sedated and unresponsive  Airway and Oxygen Therapy: Patient remains intubated per anesthesia plan  Post-op Pain: none  Post-op Assessment: Post-op Vital signs reviewed, Patient's Cardiovascular Status Stable and Respiratory Function Stable  Post-op Vital Signs: Reviewed and stable  Complications: No apparent anesthesia complications

## 2012-08-02 NOTE — Progress Notes (Signed)
Pt taken off Nitric from 1PPM, increased FIO2 by 15% to 65%. Pt desats to 85%, FIO2 increased to 100% and pt recovered with a SAT of 97%. MD called by RN and advised to turn Nitric back on 1-2PPM. Pt placed on 1PPM and FIO2 to 45%. SAT is 96%. RT will monitor.

## 2012-08-02 NOTE — Progress Notes (Signed)
Wakeup assessment: Pt sedation remains on full dose, he is following commands and knoding appropriately. Nitric oxide remains on but weaning as per MD orders. Will continue to monitor closely and proceed with planned weaning off Nitric and eventual extubation if pt stable and meets weaning criteria.

## 2012-08-03 ENCOUNTER — Inpatient Hospital Stay (HOSPITAL_COMMUNITY): Payer: Medicare Other

## 2012-08-03 LAB — POCT I-STAT 3, ART BLOOD GAS (G3+)
Acid-Base Excess: 6 mmol/L — ABNORMAL HIGH (ref 0.0–2.0)
Acid-Base Excess: 7 mmol/L — ABNORMAL HIGH (ref 0.0–2.0)
Acid-Base Excess: 5 mmol/L — ABNORMAL HIGH (ref 0.0–2.0)
Bicarbonate: 29.3 mEq/L — ABNORMAL HIGH (ref 20.0–24.0)
Bicarbonate: 29.5 mEq/L — ABNORMAL HIGH (ref 20.0–24.0)
Bicarbonate: 28.5 mEq/L — ABNORMAL HIGH (ref 20.0–24.0)
O2 Saturation: 94 %
O2 Saturation: 91 %
Patient temperature: 37.2
Patient temperature: 38
Patient temperature: 38.1
TCO2: 30 mmol/L (ref 0–100)
pO2, Arterial: 65 mmHg — ABNORMAL LOW (ref 80.0–100.0)

## 2012-08-03 LAB — GLUCOSE, CAPILLARY
Glucose-Capillary: 106 mg/dL — ABNORMAL HIGH (ref 70–99)
Glucose-Capillary: 110 mg/dL — ABNORMAL HIGH (ref 70–99)
Glucose-Capillary: 89 mg/dL (ref 70–99)

## 2012-08-03 LAB — BASIC METABOLIC PANEL
CO2: 30 mEq/L (ref 19–32)
Calcium: 7.8 mg/dL — ABNORMAL LOW (ref 8.4–10.5)
Chloride: 101 mEq/L (ref 96–112)
GFR calc Af Amer: >90 mL/min (ref >90)
Sodium: 138 mEq/L (ref 135–145)

## 2012-08-03 LAB — CBC
MCH: 29.8 pg (ref 26.0–34.0)
Platelets: 125 10*3/uL — ABNORMAL LOW (ref 150–400)
RBC: 3.09 MIL/uL — ABNORMAL LOW (ref 4.22–5.81)
RDW: 17.1 % — ABNORMAL HIGH (ref 11.5–15.5)
WBC: 8.8 10*3/uL (ref 4.0–10.5)

## 2012-08-03 LAB — POCT I-STAT, CHEM 8
BUN: 21 mg/dL (ref 6–23)
Calcium, Ion: 1.11 mmol/L — ABNORMAL LOW (ref 1.13–1.30)
Creatinine, Ser: 0.9 mg/dL (ref 0.50–1.35)
Glucose, Bld: 120 mg/dL — ABNORMAL HIGH (ref 70–99)
TCO2: 27 mmol/L (ref 0–100)

## 2012-08-03 MED ORDER — LEVALBUTEROL HCL 0.63 MG/3ML IN NEBU
0.6300 mg | INHALATION_SOLUTION | Freq: Four times a day (QID) | RESPIRATORY_TRACT | Status: DC | PRN
  Filled 2012-08-03: qty 3

## 2012-08-03 MED ORDER — LEVALBUTEROL HCL 0.63 MG/3ML IN NEBU: 0.6300 mg | INHALATION_SOLUTION | Freq: Four times a day (QID) | RESPIRATORY_TRACT | Status: DC

## 2012-08-03 MED ORDER — POTASSIUM CHLORIDE 10 MEQ/50ML IV SOLN
10.0000 meq | INTRAVENOUS | Status: AC
  Administered 2012-08-03 (×2): 10 meq via INTRAVENOUS

## 2012-08-03 MED ORDER — FUROSEMIDE 10 MG/ML IJ SOLN
40.0000 mg | Freq: Once | INTRAMUSCULAR | Status: AC
  Administered 2012-08-03: 40 mg via INTRAVENOUS

## 2012-08-03 MED ORDER — POTASSIUM CHLORIDE 10 MEQ/50ML IV SOLN
10.0000 meq | INTRAVENOUS | Status: AC
  Administered 2012-08-03 (×3): 10 meq via INTRAVENOUS
  Filled 2012-08-03: qty 150

## 2012-08-03 MED ORDER — LEVALBUTEROL HCL 0.63 MG/3ML IN NEBU: 0.6300 mg | INHALATION_SOLUTION | RESPIRATORY_TRACT | Status: DC | PRN

## 2012-08-03 NOTE — Progress Notes (Signed)
Respiratory note-Dr. Tyrone Sage at bedside, with orders to wean Nitric off. Turned down to 0.5ppm, sp02 holding at 92-93%. Wean off if able, continue to monitor.

## 2012-08-03 NOTE — Progress Notes (Signed)
Patient ID: SKYLOR SCHNAPP, male   DOB: 12/02/1945, 67 y.o.   MRN: [id number] TCTS DAILY PROGRESS NOTE                   [address]            [name] [address]          [phone]      2 Days Post-Op Procedure(s) (LRB): MEDIASTINAL EXPLORATION (N/A)  Total Length of Stay:  LOS: 5 days   Subjective: Awake, neuro intact, still on vent  Objective: Vital signs in last 24 hours: Temp:  [96.8 F (36 C)-99.1 F (37.3 C)] 99.1 F (37.3 C) (05/03 0800) Pulse Rate:  [65-78] 70 (05/03 0700) Cardiac Rhythm:  [-] Normal sinus rhythm (05/03 0400) Resp:  [16-30] 30 (05/03 0800) BP: (86-123)/(55-70) 120/68 mmHg (05/03 0800) SpO2:  [94 %-100 %] 94 % (05/03 0800) Arterial Line BP: (89-128)/(50-73) 123/62 mmHg (05/03 0800) FiO2 (%):  [39.7 %-70.1 %] 39.8 % (05/03 0800) Weight:  [221 lb 12.5 oz (100.6 kg)] 221 lb 12.5 oz (100.6 kg) (05/03 0700)  Filed Weights   08/01/12 0500 08/02/12 0500 08/03/12 0700  Weight: 206 lb 9.1 oz (93.7 kg) 226 lb 3.1 oz (102.6 kg) 221 lb 12.5 oz (100.6 kg)    Weight change: -4 lb 6.6 oz (-2 kg)   Hemodynamic parameters for last 24 hours: PAP: (24-37)/(9-22) 25/9 mmHg CO:  [4.3 L/min-5.5 L/min] 5.5 L/min CI:  [2 L/min/m2-2.5 L/min/m2] 2.5 L/min/m2  Intake/Output from previous day: 05/02 0701 - 05/03 0700 In: 2496.3 [I.V.:1936.3; NG/GT:110; IV Piggyback:450] Out: 5380 [Urine:4960; Emesis/NG output:300; Chest Tube:120]  Intake/Output this shift: Total I/O In: 120 [I.V.:20; IV Piggyback:100] Out: 945 [Urine:825; Chest Tube:120]  Current Meds: Scheduled Meds: . amiodarone  200 mg Oral BID  . antiseptic oral rinse  15 mL Mouth Rinse QID  . bisacodyl  10 mg Oral Daily   Or  . bisacodyl  10 mg Rectal Daily  . buPROPion  100 mg Oral BID  . cefUROXime (ZINACEF)  IV  1.5 g Intravenous Q12H  . chlorhexidine  15 mL Mouth Rinse BID  . docusate sodium  200 mg Oral Daily  . finasteride  5 mg Oral Daily  . insulin aspart  0-24 Units  Subcutaneous Q4H  . [START ON 08/04/2012] pantoprazole  40 mg Oral Daily  . potassium chloride  10 mEq Intravenous Q1 Hr x 2  . simvastatin  20 mg Oral QHS  . sodium chloride  3 mL Intravenous Q12H   Continuous Infusions: . sodium chloride Stopped (08/03/12 0800)  . sodium chloride 20 mL/hr at 08/02/12 2100  . dexmedetomidine 0.4 mcg/kg/hr (08/03/12 0800)  . DOPamine 2 mcg/kg/min (08/03/12 0800)  . lactated ringers Stopped (08/03/12 0800)  . nitroGLYCERIN Stopped (08/02/12 1000)  . phenylephrine (NEO-SYNEPHRINE) Adult infusion Stopped (08/02/12 0244)   PRN Meds:.albumin human, ALPRAZolam, bisacodyl, bisacodyl, metoprolol, midazolam, morphine injection, ondansetron (ZOFRAN) IV, oxyCODONE, traMADol  General appearance: alert, cooperative and no distress Neurologic: intact Heart: regular rate and rhythm, S1, S2 normal, no murmur, click, rub or gallop Lungs: clear to auscultation bilaterally Abdomen: soft, non-tender; bowel sounds normal; no masses,  no organomegaly Extremities: extremities normal, atraumatic, no cyanosis or edema and Homans sign is negative, no sign of DVT  Lab Results: CBC: Recent Labs  08/02/12 1920 08/03/12 0420  WBC 10.1 8.8  HGB 9.6* 9.2*  HCT 26.6* 25.8*  PLT 143* 125*   BMET:  Recent Labs  08/02/12 0500  08/02/12 1920 08/03/12 0420  NA 138  --  138  K 4.3  --  3.5  CL 103  --  101  CO2 27  --  30  GLUCOSE 123*  --  105*  BUN 22  --  23  CREATININE 1.11 0.93 0.77  CALCIUM 7.8*  --  7.8*    PT/INR:  Recent Labs  08/02/12 0030  LABPROT 10.6*  INR 0.75   Radiology: Ct Angio Chest Pe W/cm &/or Wo Cm  08/01/2012  *RADIOLOGY REPORT*  Clinical Data: Ascending aortic graft repair.  Chest pain. Hypotension  CT ANGIOGRAPHY CHEST  Technique:  Multidetector CT imaging of the chest using the standard protocol during bolus administration of intravenous contrast. Multiplanar reconstructed images including MIPs were obtained and reviewed to evaluate the  vascular anatomy.  Contrast: OMNIPAQUE IOHEXOL 350 MG/ML SOLN  Comparison: CT thorax 04/09 1014  Findings: Initial study was performed to opacify the pulmonary arteries.  No filling defect within the pulmonary arteries to suggest acute pulmonary embolism.  Second CT angiogram was performed to assess the thoracic aorta. The graft is intact  originated at the annulus of the aorta and extending to the left subclavian artery.  No evidence of active extravasation along the ascending, , transverse arch, or descending aorta.  No evidence of aneurysm or dissection.  The  innominate, left carotid, and left subclavian arteries originate from the aorta and are patent.   There is high density fluid surrounding the root the aorta which is circular in shape measuring 7.9 x 7.1 cm consistent with a periaortic hematoma.  This extends approximate to the level of the transverse arch.  Additionally there is high density fluid within the pericardium which is distributed uniformly and inconsistent with hemopericardium.  This  hemopericardium measures 17 mm thick along the lateral wall of the left ventricle.  There is dense in bibasilar atelectasis with air bronchogram the left and right. Small bilateral effusions.  There is some surgical gas within the anterior mediastinum which dissects along the left aspect of the mediastinum to the level of the  diaphragm (image 76).  External pacer wires are noted. Midline sternotomy wires noted.  Limited view of the upper abdomen is unremarkable. Nodular right lobe of thyroid gland extends into the thoracic inlet.  IMPRESSION:  1.  Hematoma surrounding the ascending aortic arch is a concerning for postoperative bleeding from the aortic graft repair.  There is no evidence of active extravasation along the course of the ascending, transverse, and descending aorta.  No evidence of dissection. 2.  High density fluid in pericardium is consistent with hemopericardium.  3.  No evidence of pulmonary  embolism. 4.  Dense bibasilar atelectasis.  Findings discussed with Dr. Laneta Simmers on 08/01/2012 at 1515 hours.   Original Report Authenticated By: Genevive Bi, M.D.    Dg Chest Port 1 View  08/03/2012  *RADIOLOGY REPORT*  Clinical Data: Coronary bypass, intubated  PORTABLE CHEST - 1 VIEW  Comparison: 08/02/2012  Findings: Endotracheal tube 5.3 cm above the carina.  The Swan-Ganz catheter, NG tube, mediastinal drains, and bilateral chest tubes are stable in position.  Coronary bypass changes and aortic valve replacement noted.  Heart remains enlarged with small effusions and basilar atelectasis, worse in the left lower lobe.  No pneumothorax.  No significant interval change.  IMPRESSION: Stable postoperative basilar atelectasis and small effusions worse on the left.   Original Report Authenticated By: Judie Petit. Shick, M.D.    Dg Chest Portable 1 View In  Am  08/02/2012  *RADIOLOGY REPORT*  Clinical Data: Status post thoracic surgery  PORTABLE CHEST - 1 VIEW  Comparison: Radiograph of same date.  Findings: Stable mild cardiomegaly.  Bilateral chest tubes are noted without pneumothorax.  Endotracheal tube and nasogastric tube are unchanged in position.  Swan-Ganz catheter is again noted with tip directed toward right pulmonary artery.  Minimal left basilar opacity is noted and unchanged.  IMPRESSION: No change in minimal left basilar opacity.  Bilateral chest tubes are again noted without pneumothorax.   Original Report Authenticated By: Lupita Raider.,  M.D.    Dg Chest Port 1 View  08/02/2012  *RADIOLOGY REPORT*  Clinical Data: Post operative re-exploration of the chest for bleeding.  PORTABLE CHEST - 1 VIEW  Comparison: 07/24/2012  Findings: Postoperative changes in the mediastinum with sternotomy and aortic valve prosthesis.  Endotracheal tube with tip about 3.2 cm above the carina.  Enteric tube tip is out of the field of view but below the left hemidiaphragm.  Two mediastinal drains are present.  Bilateral chest  tubes are present.  Left Swan-Ganz catheter with tip over the right pulmonary artery.  Cardiac enlargement with mild pulmonary vascular congestion.  Infiltration or atelectasis in the left lung base.  No pneumothorax.  IMPRESSION: Appliances appear to be in satisfactory location.  Cardiac enlargement with mild pulmonary vascular congestion.  Infiltration or atelectasis in the left lung base.   Original Report Authenticated By: Burman Nieves, M.D.    Dg Chest Port 1 View  08/01/2012  *RADIOLOGY REPORT*  Clinical Data: Short of breath  PORTABLE CHEST - 1 VIEW  Comparison: Chest radiograph 07/31/2012  Findings: Sternotomy wires overlie stable enlarged heart silhouette.  Bilateral small to moderate pleural effusions.  There is left lower lobe air space disease.  No pneumothorax.  IMPRESSION:  1.  No significant change. 2.  Left lower lobe edema versus infiltrate. 3.  Small bilateral pleural effusions.   Original Report Authenticated By: Genevive Bi, M.D.      Assessment/Plan: S/P Procedure(s) (LRB): MEDIASTINAL EXPLORATION (N/A) Mobilize Diuresis Continue foley due to strict I&O, patient critically ill and patient in ICU wean vent as tolerated     Ebonie Westerlund B 08/03/2012 9:03 AM

## 2012-08-03 NOTE — Progress Notes (Signed)
Mr. [name] [name] called me into his room stating he was having difficulty breathing. Sat him upright in high fowlers, on 6L [place] at this time with NO 1ppm through [place]. Upper airway wheezing and tightness. Dr. Tyrone Sage called to update. New orders received. POx 93-98 % and PA pressures unchanged 25-28/9-12. Will continue to monitor.

## 2012-08-03 NOTE — Progress Notes (Signed)
Patient following commands and cooperative and we are weaning from ventilator at this time. Soft bilateral wrist restraints removed.

## 2012-08-03 NOTE — Procedures (Signed)
Extubation Procedure Note  Patient Details:   Name: [name] [name] DOB: [birth year] MRN: [id number]   Airway Documentation:     Evaluation  O2 sats: stable throughout Complications: No apparent complications Patient did tolerate procedure well. Bilateral Breath Sounds: Rhonchi   Yes FVC , ABG called to Dr. Sharyn Blitz NItric was placed post extubation at 1 ppm, with 5l/min [place].  Newt Lukes 08/03/2012, 10:23 AM

## 2012-08-03 NOTE — Op Note (Addendum)
[name] [name], [name] [name]                ACCOUNT NO.:  [id number]  MEDICAL RECORD NO.:  [id number]  LOCATION:  2309                         FACILITY:  [facility]  PHYSICIAN:  Evelene Croon, M.D.     DATE OF BIRTH:  [birth year]  DATE OF PROCEDURE:  08/01/2012 DATE OF DISCHARGE:                              OPERATIVE REPORT   PREOPERATIVE DIAGNOSIS:  Mediastinal bleeding 3 days status post Bentall procedure and aortic arch replacement.  POSTOPERATIVE DIAGNOSIS:  Mediastinal bleeding 3 days status post Bentall procedure and aortic arch replacement.  OPERATIVE PROCEDURE:  Exploration of mediastinum, extracorporeal circulation, repair of bleeding site at the aortic annulus.  ATTENDING SURGEON:  Evelene Croon, M.D.  ASSISTANT:  [name], M.D.  ANESTHESIA:  General endotracheal.  CLINICAL HISTORY:  This patient is a 67 year old gentleman who underwent dental procedure using a pericardial valve within a Gelweave Valsalva graft and aortic arch replacement with grafting to the innominate and left common carotid artery on Monday of this week.  The patient initially had no intraoperative coagulopathy.  He had a stable postoperative course with minimal chest tube output and his chest tubes were removed on postoperative day 1.  He was transferred to the floor and looked quite good in the morning of Aug 01, 2012.  Around lunchtime, he developed sudden severe pain across his upper back and down his right side while sitting in a chair eating lunch.  He became diaphoretic and hypotensive.  The patient was seen by myself and the physician assistant and I decided a CT angiogram of the chest would be the best test to rule out pulmonary embolism as well as aortic dissection and bleeding in the mediastinum.  The patient was taken to the CT scanner in hemodynamically unstable condition, but stabilized after some intravenous fluid.  CT scan of the chest showed no pulmonary embolism.  It did show a  large amount of blood within the pericardium circumferentially around the heart as well as within the central mediastinum around the ascending aortic graft.  I felt the best option was to return the patient immediately to the operating room for exploration and drainage of this hematoma and inspection for any bleeding sites.  I discussed the operative procedure with the patient and his family including alternatives, benefits, and risks including, but not limited to bleeding, blood transfusion, infection, stroke, myocardial infarction, heart failure, organ dysfunction, and death.  He understood and agreed to proceed.  OPERATIVE PROCEDURE:  The patient was taken to the operating room in hemodynamically unstable condition.  He was hypotensive and given intravenous fluids throughout to try to stabilize him.  Preoperative intravenous antibiotics were given.  He was placed on the operating room table in supine position.  After induction of general endotracheal anesthesia, a Foley catheter placed in bladder using sterile technique. A venous sheath and a pulmonary artery catheter were inserted by Anesthesiology.  Radial arterial line placed by Anesthesiology.  Then, the chest, abdomen, and both lower extremities were prepped and draped in usual sterile manner.  The legs were only prepped down to the knees and were prepped flat.  The chest was opened through the previous median sternotomy incision.  The  sternal wires were removed.  Upon opening the sternum, there was immediate release of a large amount of fresh blood and clots that surrounded the ascending aortic graft as well as the heart.  This blood and clots were removed as quickly as possible, and it was apparent that there was active bleeding from the area of the proximal suture line at the aortic anulus.  The patient became stable after release of this tamponade.  Ongoing resuscitation with packed red blood cells was performed by  Anesthesiology.  After I examined the bleeding site, it appeared to be quite brisk and coming from the anastomosis between the top of the graft and the aortic anulus.  I did not feel I would be able to place any sutures or adequately visualize this area without patient on cardiopulmonary bypass.  Therefore, the patient was heparinized and when an adequate ACT was obtained, the distal ascending  aorta graft was cannulated using a 20-French aortic cannula for arterial inflow.  Venous outflow was achieved using a two- stage venous cannula through the right atrial appendage.  An antegrade cardioplegia and vent cannula was inserted into the more proximal ascending aortic graft.  The patient was then placed on cardiopulmonary bypass.  With the heart decompressed, I could see this area more clearly.  I placed two 3-0 Prolene pledgeted horizontal mattress sutures between the aortic wall and the aortic anulus and the cuff on the graft. After these were tied, there was still some bleeding from this area and we felt that it would probably be best to visualize this area from inside the graft.  Therefore, the ascending aortic graft was crossclamped just proximal to the innominate artery graft and 1000 mL of cold blood antegrade cardioplegia was administered into the aortic graft with quick arrest of the heart.  Systemic hypothermia to 34 degrees centigrade was performed as well as topical hypothermic iced saline. The anastomosis between the aortic root graft and the ascending aortic graft was opened circumferentially to separate the 2 grafts and allow good exposure of the aortic anulus.  Through the aortic valve prosthesis with were able to visualize the aortic anulus.  I was able to place two 3-0 Prolene pledgeted horizontal mattress sutures through the aortic anulus and through the sewing cuff on the aortic root graft.  The sutures were tied and appeared to repair this area.  I think this  was probably a small tear in the anulus related to the initial sutures that were placed to anastomose the aortic root  graft to the annulus.  After this area was repaired, the aortic root and ascending aortic grafts were reanastomosed in an end-to-end manner using continuous 3-0 Prolene suture.  We did inflate the pericardium and CO2 and we also gave additional doses of antegrade cardioplegia every 20 minutes to maintain myocardial temperature as well as possible.  The patient's head was then placed in Trendelenburg position, and de- airing maneuvers were performed.  The aortic cross-clamp was removed with total time of 65 minutes.  There was spontaneous return of a wide complex rhythm.  The patient was rewarmed to 37 degrees centigrade. After the patient had completely rewarmed, he continued to have a wide complex rhythm.  We tried twice to wean him from cardiopulmonary bypass, but both times the anterolateral wall appeared sluggish and the patient would not wean from bypass with adequate blood pressure.  Therefore, we returned to cardiopulmonary bypass, and continued to reperfuse the patient.  After another period of reperfusion, the patient's  complexes had narrowed significantly and TEE showed improved contractility of the anterolateral wall.  We tried to wean the patient from cardiopulmonary bypass a third time but were unsuccessful due to a rising PA pressure and drop in blood pressure.  I think this was precipitated by dropping oxygen saturations.  Off bypass his saturations dropped down in the low 80s.  The anterolateral cardiac function initially appeared fairly good but gradually off bypass the anterolateral wall became severely hypokinetic necessitating return to cardiopulmonary bypass. Anesthesiology performed bronchoscopy and they showed that the airway was clear.  Both lungs were reinflating.  We did open both pleural spaces and there were no significant effusions or  pneumothorax.  We were concerned that we may need to perform a bypass to the LAD using a piece of vein in case there was some distortion of the left main coronary anastomosis from retracting on the graft.  We harvested a piece of saphenous vein from the right thigh in an open manner and this vein was of large caliber and fairly good quality.  After we finished harvesting this vein, we also switched the patient over to a ventilator from the intensive care unit, and started nitric oxide at 20 parts per million. Fairly suddenly we noted that the patient's rhythm returned to sinus and the complexes narrowed back to normal.  This may have been due to intracoronary air.  TEE showed that the anterolateral wall was now moving well.  Therefore, the patient was weaned on dopamine at 5 mcg/kg/minute, as well as the nitric oxide at 20 parts per million.  The patient weaned from cardiopulmonary bypass without difficulty and maintained good oxygen saturations in the mid 90% range.  Cardiac output was 5 liters/minute off bypass.  PA pressures appeared within normal range and a CVP was quite low at 5 to 6.  Right ventricular function was at risk.  We waited for a period of time to be sure that things were stable, and then the patient was given protamine slowly and the venous and aortic cannulae were removed without difficulty.  The patient did receive 3 units of platelets as well as 2 units of fresh frozen plasma due to coagulopathy and followup coagulation parameters were within normal limits.  After correcting the coagulopathy, there was still some slight trickle of blood coming from the area of the aortic anulus.  This was treated with topical hemostatic agents and a dry pressure but it continued to ooze blood and I was concerned that there may still be some oozing from the needle holes.  I decided to give the patient factor V concentrated and this was given by Anesthesiology.  Within about 5 minutes,  this bleeding at the anulus completely ceased and the patient appeared quite dry.  Then 4 chest tubes were placed with bilateral pleural tubes with a tube in the posterior pericardium and 1 in the anterior mediastinum.  The sternum was then reapproximated with double #6 stainless steel wires.  The fascia was closed with continuous #1 Vicryl suture.  Subcutaneous tissue was closed with continuous 2-0 Vicryl and the skin with a 3-0 Vicryl subcuticular closure.  The right thigh vein harvest site was closed in layers in similar manner.  The sponge, needle, and instrument counts were correct according to the scrub nurse.  Dry sterile dressing was applied over the incision and around the chest tubes, which were hooked to Pleur-Evac suction.  The patient remained hemodynamically stable and was transported to the surgical intensive care unit  in critical but stable condition.     Evelene Croon, M.D.     BB/MEDQ  D:  08/02/2012  T:  08/03/2012  Job:  [id number]

## 2012-08-03 NOTE — Progress Notes (Signed)
Patient ID: [name] [name], male   DOB: 07/22/1945, 67 y.o.   MRN: [id number]                   [address]            [place] [address]          [phone]     2 Days Post-Op Procedure(s) (LRB): MEDIASTINAL EXPLORATION (N/A)  Total Length of Stay:  LOS: 5 days  BP 136/68  Pulse 96  Temp(Src) 99.9 F (37.7 C) (Core (Comment))  Resp 27  Ht 6' (1.829 m)  Wt 221 lb 12.5 oz (100.6 kg)  BMI 30.07 kg/m2  SpO2 97%  .Intake/Output     05/02 0701 - 05/03 0700 05/03 0701 - 05/04 0700   P.O.  240   I.V. (mL/kg) 1936.3 (19.2) 126 (1.3)   Blood     NG/GT 110    IV Piggyback 450 200   Total Intake(mL/kg) 2496.3 (24.8) 566 (5.6)   Urine (mL/kg/hr) 4960 (2.1) 2490 (2.2)   Emesis/NG output 300 (0.1)    Blood     Chest Tube 120 (0) 370 (0.3)   Total Output 5380 2860   Net -2883.7 -2294          . sodium chloride 20 mL/hr at 08/03/12 1700  . sodium chloride 20 mL/hr at 08/02/12 2100  . dexmedetomidine Stopped (08/03/12 0900)  . DOPamine 2 mcg/kg/min (08/03/12 1600)  . lactated ringers 20 mL/hr at 08/03/12 1700  . nitroGLYCERIN Stopped (08/02/12 1000)  . phenylephrine (NEO-SYNEPHRINE) Adult infusion Stopped (08/02/12 0244)     Lab Results  Component Value Date   WBC 8.8 08/03/2012   HGB 9.2* 08/03/2012   HCT 27.0* 08/03/2012   PLT 125* 08/03/2012   GLUCOSE 120* 08/03/2012   CHOL 138 01/17/2012   TRIG 63.0 01/17/2012   HDL 42.40 01/17/2012   LDLCALC 83 01/17/2012   ALT 19 07/24/2012   AST 20 07/24/2012   NA 137 08/03/2012   K 3.9 08/03/2012   CL 99 08/03/2012   CREATININE 0.90 08/03/2012   BUN 21 08/03/2012   CO2 30 08/03/2012   PSA 1.91 01/17/2012   INR 0.75 08/02/2012   HGBA1C 5.5 07/24/2012   Tolerated extubation, neuro intact Course rhonchi, bronchodilator added. On small amount of nitric oxide by nasal canula will wean off as tolered o2 sat [address]  Delight Ovens MD  Beeper [phone] Office [phone] 08/03/2012 6:13 PM

## 2012-08-03 NOTE — Plan of Care (Signed)
Problem: Phase II Progression Outcomes Goal: Patient extubated within - Outcome: Completed/Met Date Met:  08/03/12 12 hrs

## 2012-08-04 ENCOUNTER — Encounter (HOSPITAL_COMMUNITY): Payer: Self-pay | Admitting: *Deleted

## 2012-08-04 ENCOUNTER — Inpatient Hospital Stay (HOSPITAL_COMMUNITY): Payer: Medicare Other

## 2012-08-04 LAB — BASIC METABOLIC PANEL
BUN: 19 mg/dL (ref 6–23)
CO2: 29 mEq/L (ref 19–32)
Calcium: 8 mg/dL — ABNORMAL LOW (ref 8.4–10.5)
Chloride: 100 mEq/L (ref 96–112)
Creatinine, Ser: 0.64 mg/dL (ref 0.50–1.35)
GFR calc Af Amer: >90 mL/min (ref >90)
GFR calc non Af Amer: >90 mL/min (ref >90)
Glucose, Bld: 99 mg/dL (ref 70–99)
Potassium: 3.5 mEq/L (ref 3.5–5.1)
Sodium: 137 mEq/L (ref 135–145)

## 2012-08-04 LAB — CBC
HCT: 28 % — ABNORMAL LOW (ref 39.0–52.0)
Hemoglobin: 9.7 g/dL — ABNORMAL LOW (ref 13.0–17.0)
MCH: 29.7 pg (ref 26.0–34.0)
MCHC: 34.6 g/dL (ref 30.0–36.0)
MCV: 85.6 fL (ref 78.0–100.0)
Platelets: 138 10*3/uL — ABNORMAL LOW (ref 150–400)
RBC: 3.27 MIL/uL — ABNORMAL LOW (ref 4.22–5.81)
RDW: 16.3 % — ABNORMAL HIGH (ref 11.5–15.5)
WBC: 10.1 10*3/uL (ref 4.0–10.5)

## 2012-08-04 MED ORDER — INSULIN ASPART 100 UNIT/ML ~~LOC~~ SOLN: 0.0000 [IU] | SUBCUTANEOUS | Status: DC

## 2012-08-04 MED ORDER — FUROSEMIDE 10 MG/ML IJ SOLN: 40.0000 mg | Freq: Once | INTRAMUSCULAR | Status: AC

## 2012-08-04 MED ORDER — FUROSEMIDE 10 MG/ML IJ SOLN
INTRAMUSCULAR | Status: AC
  Administered 2012-08-04: 40 mg via INTRAVENOUS
  Filled 2012-08-04: qty 4

## 2012-08-04 MED ORDER — POTASSIUM CHLORIDE 10 MEQ/50ML IV SOLN
10.0000 meq | INTRAVENOUS | Status: AC | PRN
  Administered 2012-08-04 – 2012-08-05 (×3): 10 meq via INTRAVENOUS
  Filled 2012-08-04: qty 150
  Filled 2012-08-04 (×2): qty 50

## 2012-08-04 MED ORDER — POTASSIUM CHLORIDE 10 MEQ/50ML IV SOLN
INTRAVENOUS | Status: AC
  Administered 2012-08-04: 10 meq
  Filled 2012-08-04: qty 50

## 2012-08-04 NOTE — Progress Notes (Signed)
Patient ID: [name] [name], male   DOB: [birth year], 67 y.o.   MRN: [id number] TCTS DAILY PROGRESS NOTE                   [address]            [name] [name] [address]          [phone]      3 Days Post-Op Procedure(s) (LRB): MEDIASTINAL EXPLORATION (N/A)  Total Length of Stay:  LOS: 6 days   Subjective: Off vent, alert, neuro intact,   Objective: Vital signs in last 24 hours: Temp:  [98.2 F (36.8 C)-100.8 F (38.2 C)] 98.8 F (37.1 C) (05/04 0800) Pulse Rate:  [80-97] 85 (05/04 0800) Cardiac Rhythm:  [-] Normal sinus rhythm (05/04 0800) Resp:  [17-29] 23 (05/04 0800) BP: (98-140)/(56-74) 125/74 mmHg (05/04 0800) SpO2:  [93 %-100 %] 97 % (05/04 0800) Arterial Line BP: (109-136)/(51-67) 109/51 mmHg (05/03 1100) FiO2 (%):  [40 %] 40 % (05/03 1000) Weight:  [203 lb 11.3 oz (92.4 kg)] 203 lb 11.3 oz (92.4 kg) (05/04 0600)  Filed Weights   08/02/12 0500 08/03/12 0700 08/04/12 0600  Weight: 226 lb 3.1 oz (102.6 kg) 221 lb 12.5 oz (100.6 kg) 203 lb 11.3 oz (92.4 kg)    Weight change: -18 lb 1.2 oz (-8.2 kg)   Hemodynamic parameters for last 24 hours: PAP: (22-34)/(6-15) 28/12 mmHg CO:  [7.3 L/min-8.2 L/min] 7.3 L/min CI:  [3.2 L/min/m2-3.8 L/min/m2] 3.4 L/min/m2  Intake/Output from previous day: 05/03 0701 - 05/04 0700 In: 2332 [P.O.:1320; I.V.:662; IV Piggyback:350] Out: 4175 [Urine:3575; Chest Tube:600]  Intake/Output this shift: Total I/O In: 3.5 [I.V.:3.5] Out: 1565 [Urine:1565]  Current Meds: Scheduled Meds: . amiodarone  200 mg Oral BID  . antiseptic oral rinse  15 mL Mouth Rinse QID  . bisacodyl  10 mg Oral Daily   Or  . bisacodyl  10 mg Rectal Daily  . buPROPion  100 mg Oral BID  . chlorhexidine  15 mL Mouth Rinse BID  . docusate sodium  200 mg Oral Daily  . finasteride  5 mg Oral Daily  . insulin aspart  0-24 Units Subcutaneous Q4H  . pantoprazole  40 mg Oral Daily  . simvastatin  20 mg Oral QHS  . sodium chloride  3 mL Intravenous  Q12H   Continuous Infusions: . sodium chloride 20 mL/hr at 08/04/12 0857  . sodium chloride 20 mL/hr at 08/03/12 1956  . DOPamine Stopped (08/04/12 0830)  . lactated ringers 20 mL/hr at 08/04/12 0857  . nitroGLYCERIN Stopped (08/02/12 1000)  . phenylephrine (NEO-SYNEPHRINE) Adult infusion Stopped (08/02/12 0244)   PRN Meds:.ALPRAZolam, bisacodyl, bisacodyl, levalbuterol, metoprolol, morphine injection, ondansetron (ZOFRAN) IV, oxyCODONE, potassium chloride, traMADol  General appearance: alert, cooperative and fatigued Neurologic: intact Heart: regular rate and rhythm, S1, S2 normal, no murmur, click, rub or gallop Lungs: diminished breath sounds bibasilar Abdomen: soft, non-tender; bowel sounds normal; no masses,  no organomegaly Extremities: extremities normal, atraumatic, no cyanosis or edema and Homans sign is negative, no sign of DVT Wound: sternum stable  Lab Results: CBC: Recent Labs  08/03/12 0420 08/03/12 1733 08/04/12 0332  WBC 8.8  --  10.1  HGB 9.2* 9.2* 9.7*  HCT 25.8* 27.0* 28.0*  PLT 125*  --  138*   BMET:  Recent Labs  08/03/12 0420 08/03/12 1733 08/04/12 0332  NA 138 137 137  K 3.5 3.9 3.5  CL 101 99 100  CO2 30  --  29  GLUCOSE 105* 120* 99  BUN 23 21 19   CREATININE 0.77 0.90 0.64  CALCIUM 7.8*  --  8.0*    PT/INR:  Recent Labs  08/02/12 0030  LABPROT 10.6*  INR 0.75   Radiology: Dg Chest Port 1 View  08/04/2012  *RADIOLOGY REPORT*  Clinical Data: Extubated, postoperative exam  PORTABLE CHEST - 1 VIEW  Comparison: 08/03/2012  Findings: Endotracheal tube and NG tube have both been removed. Swan-Ganz catheter is within the proximal right pulmonary artery. Mediastinal drain and bilateral chest tubes remain.  The patient there is status post aortic valve replacement.  Stable cardiomegaly with slight vascular congestion and persistent basilar atelectasis, worse on the left.  Small effusions not excluded.  No enlarging or significant pneumothorax.   Overall stable exam.  IMPRESSION: Extubated.  Stable postoperative chest exam.   Original Report Authenticated By: Judie Petit. Miles Costain, M.D.    Dg Chest Port 1 View  08/03/2012  *RADIOLOGY REPORT*  Clinical Data: Coronary bypass, intubated  PORTABLE CHEST - 1 VIEW  Comparison: 08/02/2012  Findings: Endotracheal tube 5.3 cm above the carina.  The Swan-Ganz catheter, NG tube, mediastinal drains, and bilateral chest tubes are stable in position.  Coronary bypass changes and aortic valve replacement noted.  Heart remains enlarged with small effusions and basilar atelectasis, worse in the left lower lobe.  No pneumothorax.  No significant interval change.  IMPRESSION: Stable postoperative basilar atelectasis and small effusions worse on the left.   Original Report Authenticated By: Judie Petit. Miles Costain, M.D.      Assessment/Plan: S/P Procedure(s) (LRB): MEDIASTINAL EXPLORATION (N/A) Mobilize Diuresis d/c tubes/lines Continue foley due to diuresing patient, patient in ICU and urinary output monitoring     [name] [name] B 08/04/2012 9:19 AM

## 2012-08-04 NOTE — Progress Notes (Signed)
K+= 3.5 and creat= 0.6 w/ urine o/p > 30cc/hr; TCTS KCL protocol initiated.

## 2012-08-04 NOTE — Progress Notes (Signed)
Patient ID: [name] [name], male   DOB: [birth year], 67 y.o.   MRN: [id number]                   [address]            [place] [address]          [phone]     3 Days Post-Op Procedure(s) (LRB): MEDIASTINAL EXPLORATION (N/A)  Total Length of Stay:  LOS: 6 days  BP 124/68  Pulse 85  Temp(Src) 97.6 F (36.4 C) (Oral)  Resp 29  Ht 6' (1.829 m)  Wt 203 lb 11.3 oz (92.4 kg)  BMI 27.62 kg/m2  SpO2 97%  .Intake/Output     05/03 0701 - 05/04 0700 05/04 0701 - 05/05 0700   P.O. 1320 240   I.V. (mL/kg) 662 (7.2) 283.5 (3.1)   NG/GT     IV Piggyback 350    Total Intake(mL/kg) 2332 (25.2) 523.5 (5.7)   Urine (mL/kg/hr) 3575 (1.6) 2655 (2.5)   Emesis/NG output     Chest Tube 600 (0.3) 60 (0.1)   Total Output 4175 2715   Net -1843 -2191.5          . sodium chloride Stopped (08/04/12 1300)  . sodium chloride 20 mL/hr at 08/03/12 1956  . lactated ringers 20 mL/hr at 08/04/12 1500  . nitroGLYCERIN Stopped (08/02/12 1000)  . phenylephrine (NEO-SYNEPHRINE) Adult infusion Stopped (08/02/12 0244)     Lab Results  Component Value Date   WBC 10.1 08/04/2012   HGB 9.7* 08/04/2012   HCT 28.0* 08/04/2012   PLT 138* 08/04/2012   GLUCOSE 99 08/04/2012   CHOL 138 01/17/2012   TRIG 63.0 01/17/2012   HDL 42.40 01/17/2012   LDLCALC 83 01/17/2012   ALT 19 07/24/2012   AST 20 07/24/2012   NA 137 08/04/2012   K 3.5 08/04/2012   CL 100 08/04/2012   CREATININE 0.64 08/04/2012   BUN 19 08/04/2012   CO2 29 08/04/2012   PSA 1.91 01/17/2012   INR 0.75 08/02/2012   HGBA1C 5.5 07/24/2012   Stable today, good uop with lasix Ct out   Delight Ovens MD  Beeper [phone] Office [phone] 08/04/2012 6:35 PM

## 2012-08-05 ENCOUNTER — Inpatient Hospital Stay (HOSPITAL_COMMUNITY): Payer: Medicare Other

## 2012-08-05 ENCOUNTER — Encounter (HOSPITAL_COMMUNITY): Payer: Self-pay | Admitting: Surgery

## 2012-08-05 LAB — CBC
HCT: 27.6 % — ABNORMAL LOW (ref 39.0–52.0)
Hemoglobin: 9.5 g/dL — ABNORMAL LOW (ref 13.0–17.0)
MCH: 29.9 pg (ref 26.0–34.0)
MCHC: 34.4 g/dL (ref 30.0–36.0)
MCV: 86.8 fL (ref 78.0–100.0)
Platelets: 158 10*3/uL (ref 150–400)
RBC: 3.18 MIL/uL — ABNORMAL LOW (ref 4.22–5.81)
RDW: 15.7 % — ABNORMAL HIGH (ref 11.5–15.5)
WBC: 9.4 10*3/uL (ref 4.0–10.5)

## 2012-08-05 LAB — TYPE AND SCREEN
ABO/RH(D): A POS
Antibody Screen: NEGATIVE
Unit division: 0
Unit division: 0

## 2012-08-05 LAB — BASIC METABOLIC PANEL
BUN: 20 mg/dL (ref 6–23)
CO2: 31 mEq/L (ref 19–32)
Calcium: 8.1 mg/dL — ABNORMAL LOW (ref 8.4–10.5)
Chloride: 100 mEq/L (ref 96–112)
Creatinine, Ser: 0.83 mg/dL (ref 0.50–1.35)
GFR calc Af Amer: >90 mL/min (ref >90)
GFR calc non Af Amer: 88 mL/min — ABNORMAL LOW (ref >90)
Glucose, Bld: 95 mg/dL (ref 70–99)
Potassium: 3.6 mEq/L (ref 3.5–5.1)
Sodium: 138 mEq/L (ref 135–145)

## 2012-08-05 LAB — GLUCOSE, CAPILLARY

## 2012-08-05 MED ORDER — FUROSEMIDE 40 MG PO TABS
40.0000 mg | ORAL_TABLET | Freq: Every day | ORAL | Status: AC
  Administered 2012-08-05: 40 mg via ORAL
  Filled 2012-08-05: qty 1

## 2012-08-05 MED ORDER — POTASSIUM CHLORIDE 10 MEQ/50ML IV SOLN
10.0000 meq | INTRAVENOUS | Status: AC
  Administered 2012-08-05 (×2): 10 meq via INTRAVENOUS

## 2012-08-05 NOTE — Op Note (Signed)
[name] [name], [name] [name]                ACCOUNT NO.:  [id number]  MEDICAL RECORD NO.:  [id number]  LOCATION:                                 FACILITY:  PHYSICIAN:  Evelene Croon, M.D.     DATE OF BIRTH:  06-Mar-1946  DATE OF PROCEDURE:  08/01/2012 DATE OF DISCHARGE:                              OPERATIVE REPORT   PREOPERATIVE DIAGNOSIS:  Mediastinal bleeding 3 days status post Bentall procedure and aortic arch replacement.  POSTOPERATIVE DIAGNOSIS:  Mediastinal bleeding 3 days status post Bentall  procedure and aortic arch replacement.  PROCEDURE:  Exploration of mediastinum, repair of bleeding site at the aortic anulus.     Evelene Croon, M.D.     BB/MEDQ  D:  08/02/2012  T:  08/03/2012  Job:  [id number]

## 2012-08-05 NOTE — Progress Notes (Signed)
POD 7/4  Resting comfortably, watching TV  BP 111/56  Pulse 81  Temp(Src) 99.1 F (37.3 C) (Oral)  Resp 23  Ht 6' (1.829 m)  Wt 196 lb 10.4 oz (89.2 kg)  BMI 26.66 kg/m2  SpO2 96%   Intake/Output Summary (Last 24 hours) at 08/05/12 2115 Last data filed at 08/05/12 2000  Gross per 24 hour  Intake   1370 ml  Output   1620 ml  Net   -250 ml   CBg well controlled  No PM labs

## 2012-08-05 NOTE — Progress Notes (Signed)
Patient ID: [name] [name], male   DOB: 11-Jul-1945, 67 y.o.   MRN: [id number] TCTS DAILY PROGRESS NOTE                   [address]            [name] [address]          [phone]      4 Days Post-Op Procedure(s) (LRB): MEDIASTINAL EXPLORATION (N/A)  Total Length of Stay:  LOS: 7 days   Subjective: Up in chair this am, feels better, neuro intact  Objective: Vital signs in last 24 hours: Temp:  [97.6 F (36.4 C)-98.7 F (37.1 C)] 97.6 F (36.4 C) (05/05 0741) Pulse Rate:  [73-95] 77 (05/05 0700) Cardiac Rhythm:  [-] Normal sinus rhythm (05/05 0800) Resp:  [15-29] 20 (05/05 0700) BP: (89-134)/(36-76) 89/36 mmHg (05/05 0700) SpO2:  [90 %-99 %] 96 % (05/05 0700) Weight:  [196 lb 10.4 oz (89.2 kg)] 196 lb 10.4 oz (89.2 kg) (05/05 0500)  Filed Weights   08/03/12 0700 08/04/12 0600 08/05/12 0500  Weight: 221 lb 12.5 oz (100.6 kg) 203 lb 11.3 oz (92.4 kg) 196 lb 10.4 oz (89.2 kg)    Weight change: -7 lb 0.9 oz (-3.2 kg)   Hemodynamic parameters for last 24 hours:    Intake/Output from previous day: 05/04 0701 - 05/05 0700 In: 1323.5 [P.O.:680; I.V.:643.5] Out: 3615 [Urine:3555; Chest Tube:60]  Intake/Output this shift: Total I/O In: 20 [I.V.:20] Out: 60 [Urine:60]  Current Meds: Scheduled Meds: . amiodarone  200 mg Oral BID  . bisacodyl  10 mg Oral Daily   Or  . bisacodyl  10 mg Rectal Daily  . buPROPion  100 mg Oral BID  . docusate sodium  200 mg Oral Daily  . finasteride  5 mg Oral Daily  . insulin aspart  0-24 Units Subcutaneous Q4H  . pantoprazole  40 mg Oral Daily  . simvastatin  20 mg Oral QHS  . sodium chloride  3 mL Intravenous Q12H   Continuous Infusions: . sodium chloride Stopped (08/04/12 1300)  . sodium chloride 20 mL/hr at 08/03/12 1956  . lactated ringers 20 mL/hr at 08/04/12 2300  . nitroGLYCERIN Stopped (08/02/12 1000)  . phenylephrine (NEO-SYNEPHRINE) Adult infusion Stopped (08/02/12 0244)   PRN Meds:.ALPRAZolam,  bisacodyl, bisacodyl, levalbuterol, metoprolol, morphine injection, ondansetron (ZOFRAN) IV, oxyCODONE, potassium chloride, traMADol  General appearance: alert, cooperative and no distress Neurologic: intact Heart: regular rate and rhythm, S1, S2 normal, no murmur, click, rub or gallop Lungs: diminished breath sounds bibasilar Abdomen: soft, non-tender; bowel sounds normal; no masses,  no organomegaly Extremities: extremities normal, atraumatic, no cyanosis or edema and Homans sign is negative, no sign of DVT Wound: sternum stable  Lab Results: CBC: Recent Labs  08/04/12 0332 08/05/12 0451  WBC 10.1 9.4  HGB 9.7* 9.5*  HCT 28.0* 27.6*  PLT 138* 158   BMET:  Recent Labs  08/04/12 0332 08/05/12 0451  NA 137 138  K 3.5 3.6  CL 100 100  CO2 29 31  GLUCOSE 99 95  BUN 19 20  CREATININE 0.64 0.83  CALCIUM 8.0* 8.1*    PT/INR: No results found for this basename: LABPROT, INR,  in the last 72 hours Radiology: Dg Chest Port 1 View  08/05/2012  *RADIOLOGY REPORT*  Clinical Data: Cardiac surgery  PORTABLE CHEST - 1 VIEW  Comparison: 08/04/2012  Findings: Stable cardiomegaly.  Bibasilar atelectasis improved.  No pneumothorax.  Swan-Ganz removed with the left  internal jugular introducer sheath left in place.  Chest tubes removed.  Normal vascularity.  IMPRESSION: Chest tubes removed without pneumothorax.  Improved bibasilar atelectasis.   Original Report Authenticated By: Jolaine Click, M.D.    Dg Chest Port 1 View  08/04/2012  *RADIOLOGY REPORT*  Clinical Data: Extubated, postoperative exam  PORTABLE CHEST - 1 VIEW  Comparison: 08/03/2012  Findings: Endotracheal tube and NG tube have both been removed. Swan-Ganz catheter is within the proximal right pulmonary artery. Mediastinal drain and bilateral chest tubes remain.  The patient there is status post aortic valve replacement.  Stable cardiomegaly with slight vascular congestion and persistent basilar atelectasis, worse on the left.  Small  effusions not excluded.  No enlarging or significant pneumothorax.  Overall stable exam.  IMPRESSION: Extubated.  Stable postoperative chest exam.   Original Report Authenticated By: Judie Petit. Miles Costain, M.D.      Assessment/Plan: S/P Procedure(s) (LRB): MEDIASTINAL EXPLORATION (N/A) Mobilize Diuresis improving activity level D/c foley and central line To 2000 in am Replace k for hypokalemia   Teresina Bugaj B 08/05/2012 10:53 AM

## 2012-08-05 NOTE — Progress Notes (Signed)
UR Completed.  [name] [name] Jane [phone] 08/05/2012  

## 2012-08-06 LAB — BASIC METABOLIC PANEL
BUN: 14 mg/dL (ref 6–23)
CO2: 30 mEq/L (ref 19–32)
Calcium: 8.5 mg/dL (ref 8.4–10.5)
Chloride: 99 mEq/L (ref 96–112)
Creatinine, Ser: 0.96 mg/dL (ref 0.50–1.35)
GFR calc Af Amer: >90 mL/min (ref >90)
GFR calc non Af Amer: 83 mL/min — ABNORMAL LOW (ref >90)
Glucose, Bld: 147 mg/dL — ABNORMAL HIGH (ref 70–99)
Potassium: 3.5 mEq/L (ref 3.5–5.1)
Sodium: 139 mEq/L (ref 135–145)

## 2012-08-06 LAB — GLUCOSE, CAPILLARY
Glucose-Capillary: 130 mg/dL — ABNORMAL HIGH (ref 70–99)
Glucose-Capillary: 86 mg/dL (ref 70–99)
Glucose-Capillary: 144 mg/dL — ABNORMAL HIGH (ref 70–99)
Glucose-Capillary: 96 mg/dL (ref 70–99)

## 2012-08-06 LAB — CBC
HCT: 28 % — ABNORMAL LOW (ref 39.0–52.0)
Hemoglobin: 9.9 g/dL — ABNORMAL LOW (ref 13.0–17.0)
MCH: 30.3 pg (ref 26.0–34.0)
MCHC: 35.4 g/dL (ref 30.0–36.0)
MCV: 85.6 fL (ref 78.0–100.0)
Platelets: 216 10*3/uL (ref 150–400)
RBC: 3.27 MIL/uL — ABNORMAL LOW (ref 4.22–5.81)
RDW: 15.6 % — ABNORMAL HIGH (ref 11.5–15.5)
WBC: 10.2 10*3/uL (ref 4.0–10.5)

## 2012-08-06 MED ORDER — METOPROLOL TARTRATE 12.5 MG HALF TABLET
12.5000 mg | ORAL_TABLET | Freq: Two times a day (BID) | ORAL | Status: DC
  Administered 2012-08-06 – 2012-08-08 (×5): 12.5 mg via ORAL
  Filled 2012-08-06 (×6): qty 1

## 2012-08-06 MED ORDER — POTASSIUM CHLORIDE 10 MEQ/50ML IV SOLN
10.0000 meq | INTRAVENOUS | Status: DC
  Administered 2012-08-06 (×2): 10 meq via INTRAVENOUS

## 2012-08-06 MED ORDER — ASPIRIN EC 81 MG PO TBEC
81.0000 mg | DELAYED_RELEASE_TABLET | Freq: Every day | ORAL | Status: DC
  Administered 2012-08-06 – 2012-08-08 (×3): 81 mg via ORAL
  Filled 2012-08-06 (×4): qty 1

## 2012-08-06 MED ORDER — SODIUM CHLORIDE 0.9 % IJ SOLN: 3.0000 mL | INTRAMUSCULAR | Status: DC | PRN

## 2012-08-06 MED ORDER — ASPIRIN 81 MG PO CHEW
CHEWABLE_TABLET | ORAL | Status: AC
  Administered 2012-08-06: 81 mg
  Filled 2012-08-06: qty 1

## 2012-08-06 MED ORDER — FUROSEMIDE 40 MG PO TABS
40.0000 mg | ORAL_TABLET | Freq: Every day | ORAL | Status: AC
  Administered 2012-08-06 – 2012-08-08 (×3): 40 mg via ORAL
  Filled 2012-08-06 (×3): qty 1

## 2012-08-06 MED ORDER — POTASSIUM CHLORIDE 10 MEQ/50ML IV SOLN
INTRAVENOUS | Status: AC
  Administered 2012-08-06: 10 meq via INTRAVENOUS
  Filled 2012-08-06: qty 150

## 2012-08-06 MED ORDER — SODIUM CHLORIDE 0.9 % IJ SOLN
3.0000 mL | Freq: Two times a day (BID) | INTRAMUSCULAR | Status: DC
  Administered 2012-08-06 – 2012-08-07 (×2): 3 mL via INTRAVENOUS

## 2012-08-06 MED ORDER — BOOST / RESOURCE BREEZE PO LIQD
1.0000 | Freq: Three times a day (TID) | ORAL | Status: DC
  Administered 2012-08-06 – 2012-08-08 (×2): 1 via ORAL

## 2012-08-06 MED ORDER — SODIUM CHLORIDE 0.9 % IV SOLN: 250.0000 mL | INTRAVENOUS | Status: DC | PRN

## 2012-08-06 MED ORDER — POTASSIUM CHLORIDE CRYS ER 20 MEQ PO TBCR
40.0000 meq | EXTENDED_RELEASE_TABLET | Freq: Every day | ORAL | Status: DC
  Administered 2012-08-06: 40 meq via ORAL
  Filled 2012-08-06 (×2): qty 2

## 2012-08-06 MED FILL — Heparin Sodium (Porcine) Inj 1000 Unit/ML: INTRAMUSCULAR | Qty: 20 | Status: AC

## 2012-08-06 MED FILL — Lidocaine HCl IV Inj 20 MG/ML: INTRAVENOUS | Qty: 5 | Status: AC

## 2012-08-06 MED FILL — Sodium Bicarbonate IV Soln 8.4%: INTRAVENOUS | Qty: 50 | Status: AC

## 2012-08-06 MED FILL — Electrolyte-R (PH 7.4) Solution: INTRAVENOUS | Qty: 5000 | Status: AC

## 2012-08-06 MED FILL — Calcium Chloride Inj 10%: INTRAVENOUS | Qty: 10 | Status: AC

## 2012-08-06 MED FILL — Mannitol IV Soln 20%: INTRAVENOUS | Qty: 500 | Status: AC

## 2012-08-06 MED FILL — Sodium Chloride Irrigation Soln 0.9%: Qty: 3000 | Status: AC

## 2012-08-06 MED FILL — Sodium Chloride IV Soln 0.9%: INTRAVENOUS | Qty: 1000 | Status: AC

## 2012-08-06 NOTE — Progress Notes (Addendum)
NUTRITION FOLLOW UP  Intervention:    Continue Resource Breeze 3 times daily (250 kcals, 9 gm protein per 8 fl oz carton) RD to follow for nutrition care plan  Nutrition Dx:   Inadequate oral intake now related to limited appetite, nausea as evidenced by patient report, ongoing  New Goal:   Oral intake with meals & supplements to meet >/= 90% of estimated nutrition needs, progressing  Monitor:   PO & supplemental intake, weight, labs, I/O's  Assessment:   Patient s/p procedures 4/29:  MEDIAN STERNOTOMY  LEFT COMMON CAROTID ARTERY BYPASS  BENTALL PROCEDURE  Patient s/p procedure 5/1:  MEDIASTINAL EXPLORATION (repair of bleeding suture line between graft and aortic annulus)  Patient extubated 5/3.  Initiated on Clear Liquids 5/4.  Reports his appetite isn't great, having some nausea.  He doesn't care for sweet "things;" however amenable to trying Resource Breeze supplement to help meet protein needs ---> orders already in place.  Height: Ht Readings from Last 1 Encounters:  07/29/12 6' (1.829 m)    Weight Status:   Wt Readings from Last 1 Encounters:  08/06/12 196 lb 10.4 oz (89.2 kg)    Re-estimated needs:  Kcal: 2100-2300 Protein: 115-125 gm Fluid: 2.1-2.3 L  Skin: sternum, thigh & axillary surgical incisions   Diet Order: Clear Liquid   Intake/Output Summary (Last 24 hours) at 08/06/12 1114 Last data filed at 08/06/12 0800  Gross per 24 hour  Intake    990 ml  Output   2735 ml  Net  -1745 ml    Last BM: 5/6  Labs:   Recent Labs Lab 07/30/12 1700  08/02/12 0500 08/02/12 1920  08/04/12 0332 08/05/12 0451 08/06/12 0340  NA  --   < > 138  --   < > 137 138 139  K  --   < > 4.3  --   < > 3.5 3.6 3.5  CL  --   < > 103  --   < > 100 100 99  CO2  --   < > 27  --   < > 29 31 30   BUN  --   < > 22  --   < > 19 20 14   CREATININE 0.86  < > 1.11 0.93  < > 0.64 0.83 0.96  CALCIUM  --   < > 7.8*  --   < > 8.0* 8.1* 8.5  MG 2.2  --  1.8 1.8  --   --   --   --    GLUCOSE  --   < > 123*  --   < > 99 95 147*  < > = values in this interval not displayed.  CBG (last 3)   Recent Labs  08/05/12 1209 08/05/12 1954 08/06/12 0406  GLUCAP 110* 99 144*  144*    Scheduled Meds: . aspirin EC  81 mg Oral Daily  . bisacodyl  10 mg Oral Daily   Or  . bisacodyl  10 mg Rectal Daily  . buPROPion  100 mg Oral BID  . docusate sodium  200 mg Oral Daily  . feeding supplement  1 Container Oral TID WC  . finasteride  5 mg Oral Daily  . furosemide  40 mg Oral Daily  . insulin aspart  0-24 Units Subcutaneous Q4H  . metoprolol tartrate  12.5 mg Oral BID  . pantoprazole  40 mg Oral Daily  . potassium chloride  40 mEq Oral Daily  . simvastatin  20 mg  Oral QHS  . sodium chloride  3 mL Intravenous Q12H    Continuous Infusions: . sodium chloride Stopped (08/04/12 1300)  . lactated ringers 20 mL/hr at 08/04/12 2300    Maureen Chatters, RD, LDN Pager #: [phone] After-Hours Pager #: [phone]

## 2012-08-06 NOTE — Progress Notes (Signed)
At bedside to discuss PICC with patient and wife.  After explanation including risk and benefit given, patient request that I attempt for PIV.  After looking at arms and hands for site, multiple sites identified.  Attempt made and successful.  Bedside RN updated.  Geneva Barrero, Lajean Manes RN IV team.

## 2012-08-06 NOTE — Clinical Documentation Improvement (Signed)
RESPIRATORY FAILURE DOCUMENTATION CLARIFICATION QUERY   THIS DOCUMENT IS NOT A PERMANENT PART OF THE MEDICAL RECORD  TO RESPOND TO THE THIS QUERY, FOLLOW THE INSTRUCTIONS BELOW:  1. If needed, update documentation for the patient's encounter via the notes activity.  2. Access this query again and click edit on the In [employer].  3. After updating, or not, click F2 to complete all highlighted (required) fields concerning your review. Select "additional documentation in the medical record" OR "no additional documentation provided".  4. Click Sign note button.  5. The deficiency will fall out of your In Basket *Please let us know if you are not able to complete this workflow by phone or e-mail (listed below).  Please update your documentation within the medical record to reflect your response to this query.                                                                                    08/06/12  Dear Dr. Laneta Simmers Marton Redwood,   In a better effort to capture your patient's severity of illness, reflect appropriate length of stay and utilization of resources, a review of the patient medical record has revealed the following indicators.    Based on your clinical judgment, please clarify and document in a progress note and/or discharge summary the clinical condition associated with the following supporting information:  In responding to this query please exercise your independent judgment.  The fact that a query is asked, does not imply that any particular answer is desired or expected.   Possible Clinical Conditions?  _______Post Operative Respiratory Failure  _______Vent Dependent Respiratory Failure  _______Acute Respiratory Failure  _______Other Condition  _______Cannot Clinically Determine      Risk Factors: Remains sedated on the vent, unable to wean nitric oxide off today; intubated and sedated noted per 5/02 progress notes. Wean vent as tolerated noted per 5/03 progress  note @ 0903.    Respiratory Treatment: Extubated 5/03 @1024 .                    You may use possible, probable, or suspect with inpatient documentation. possible, probable, suspected diagnoses MUST be documented at the time of discharge  Reviewed:  no additional documentation provided  Thank You,  Marciano Sequin,  Clinical Documentation Specialist:  Phone: [phone]  Health Information Management [place]

## 2012-08-06 NOTE — Progress Notes (Addendum)
                   [address]            [place] [address]          [phone]      5 Days Post-Op Procedure(s) (LRB): MEDIASTINAL EXPLORATION (N/A)  Subjective: Patient is awake and alert. Only complaint is nausea. He denies abdominal pain or emesis.  Objective: Vital signs in last 24 hours: Temp:  [97.4 F (36.3 C)-99.1 F (37.3 C)] 98.3 F (36.8 C) (05/06 0400) Pulse Rate:  [72-85] 78 (05/06 0700) Cardiac Rhythm:  [-] Normal sinus rhythm (05/06 0400) Resp:  [14-27] 20 (05/06 0700) BP: (85-131)/(33-80) 131/59 mmHg (05/06 0700) SpO2:  [90 %-96 %] 93 % (05/06 0700) Weight:  [89.2 kg (196 lb 10.4 oz)] 89.2 kg (196 lb 10.4 oz) (05/06 0600)   Current Weight  08/06/12 89.2 kg (196 lb 10.4 oz)      Intake/Output from previous day: 05/05 0701 - 05/06 0700 In: 1220 [P.O.:560; I.V.:460; IV Piggyback:200] Out: 2745 [Urine:2745]   Physical Exam:  Cardiovascular: RRR, no murmurs, gallops, or rubs. Pulmonary: Clear to auscultation bilaterally; no rales, wheezes, or rhonchi. Abdomen: Soft, non tender, bowel sounds present. Extremities: Trace bilateral lower extremity edema. Wounds: Clean and dry.  No erythema or signs of infection.  Lab Results: CBC: Recent Labs  08/05/12 0451 08/06/12 0340  WBC 9.4 10.2  HGB 9.5* 9.9*  HCT 27.6* 28.0*  PLT 158 216   BMET:  Recent Labs  08/05/12 0451 08/06/12 0340  NA 138 139  K 3.6 3.5  CL 100 99  CO2 31 30  GLUCOSE 95 147*  BUN 20 14  CREATININE 0.83 0.96  CALCIUM 8.1* 8.5    PT/INR:  Lab Results  Component Value Date   INR 0.75 08/02/2012   INR 1.57* 08/01/2012   INR 1.42 07/29/2012   ABG:  INR: Will add last result for INR, ABG once components are confirmed Will add last 4 CBG results once components are confirmed  Assessment/Plan:  1. CV - SR. On Amiodarone 200 bid. Will stop. Start low dose Lopressor. Will start [place] 81 daily 2.  Pulmonary - On 2 liters of oxygen via [place]. Wean as  tolerates.Encourage incentive spirometer 3. Volume Overload - Daily Lasix 4.  Acute blood loss anemia - H and H stable at 9.9 and 28 5.Supplement potassium 7.CBGs 110/99/144. Pre op HGA1C 5.5. Likely pre diabetic. Will continue accu checks for 24 more hours then stop. 8.GI-persistent nausea. Had a bowel movement.May be due to Amiodarone. Will stop Amiodarone. Will try  resource 9.Poor venous access. Central line remains as was unable to obtain a peripheral. May need PICC 10.Transfer to PCTU  ZIMMERMAN,DONIELLE MPA-C 08/06/2012,7:29 AM  I have seen and examined Art Buff and agree with the above assessment  and plan.  Delight Ovens MD Beeper [phone] Office [phone]

## 2012-08-06 NOTE — Plan of Care (Signed)
Problem: Phase III Progression Outcomes Goal: Time patient transferred to PCTU/Telemetry POD Outcome: Completed/Met Date Met:  08/06/12 1450 Transferred to 2017.  Pt walked all the way from 2309 to 2017. Tolerated well.  No changes.

## 2012-08-06 NOTE — Progress Notes (Signed)
Pt requested Zofran to prevent nausea with supper; pt given 4mg  IV Zofran at this time; will cont. To monitor.

## 2012-08-07 ENCOUNTER — Inpatient Hospital Stay (HOSPITAL_COMMUNITY): Payer: Medicare Other

## 2012-08-07 LAB — CBC
HCT: 29.2 % — ABNORMAL LOW (ref 39.0–52.0)
Hemoglobin: 10.1 g/dL — ABNORMAL LOW (ref 13.0–17.0)
MCH: 30 pg (ref 26.0–34.0)
MCHC: 34.6 g/dL (ref 30.0–36.0)
RBC: 3.37 MIL/uL — ABNORMAL LOW (ref 4.22–5.81)

## 2012-08-07 LAB — GLUCOSE, CAPILLARY
Glucose-Capillary: 119 mg/dL — ABNORMAL HIGH (ref 70–99)
Glucose-Capillary: 125 mg/dL — ABNORMAL HIGH (ref 70–99)
Glucose-Capillary: 133 mg/dL — ABNORMAL HIGH (ref 70–99)
Glucose-Capillary: 99 mg/dL (ref 70–99)

## 2012-08-07 LAB — BASIC METABOLIC PANEL
BUN: 14 mg/dL (ref 6–23)
Chloride: 101 mEq/L (ref 96–112)
GFR calc Af Amer: >90 mL/min (ref >90)
Glucose, Bld: 110 mg/dL — ABNORMAL HIGH (ref 70–99)
Potassium: 4.5 mEq/L (ref 3.5–5.1)
Sodium: 137 mEq/L (ref 135–145)

## 2012-08-07 MED ORDER — POTASSIUM CHLORIDE 20 MEQ/15ML (10%) PO LIQD
40.0000 meq | Freq: Every day | ORAL | Status: AC
  Administered 2012-08-07 – 2012-08-08 (×2): 40 meq via ORAL
  Filled 2012-08-07 (×2): qty 30

## 2012-08-07 MED ORDER — INSULIN ASPART 100 UNIT/ML ~~LOC~~ SOLN
0.0000 [IU] | SUBCUTANEOUS | Status: DC
  Administered 2012-08-07: 2 [IU] via SUBCUTANEOUS

## 2012-08-07 NOTE — Progress Notes (Signed)
Pt up ambulating in hallway with wife at this time; steady gait noted; no walker needed; will cont. To monitor.

## 2012-08-07 NOTE — Progress Notes (Signed)
L IJ central line d/c at this time; occlusive dressing with vasoline gauze applied to site; bedrest rest for 30 min; will cont. To monitor.

## 2012-08-07 NOTE — Progress Notes (Signed)
6 Days Post-Op Procedure(s) (LRB): MEDIASTINAL EXPLORATION (N/A) Subjective: Doing well after bio-Bentall procedure followed by reexploration for bleeding. Maintaining sinus rhythm, off O2, and diuresing fluid increased weight Surgical incisions clean  Objective: Vital signs in last 24 hours: Temp:  [97.5 F (36.4 C)-98.2 F (36.8 C)] 98.2 F (36.8 C) (05/06 2044) Pulse Rate:  [72-82] 77 (05/06 2044) Cardiac Rhythm:  [-] Normal sinus rhythm (05/07 0817) Resp:  [18-31] 18 (05/06 2044) BP: (116-120)/(61-69) 116/61 mmHg (05/06 2044) SpO2:  [94 %-99 %] 96 % (05/06 2044) Weight:  [192 lb 3.9 oz (87.2 kg)] 192 lb 3.9 oz (87.2 kg) (05/07 0605)  Hemodynamic parameters for last 24 hours:   normal sinus rhythm  Intake/Output from previous day: 05/06 0701 - 05/07 0700 In: 530 [P.O.:480; IV Piggyback:50] Out: 950 [Urine:950] Intake/Output this shift: Total I/O In: 240 [P.O.:240] Out: -   Exam Patient feels well No murmur Sternal incision stable Mild pedal edema Chest x-rays clear Lab Results:  Recent Labs  08/05/12 0451 08/06/12 0340  WBC 9.4 10.2  HGB 9.5* 9.9*  HCT 27.6* 28.0*  PLT 158 216   BMET:  Recent Labs  08/05/12 0451 08/06/12 0340  NA 138 139  K 3.6 3.5  CL 100 99  CO2 31 30  GLUCOSE 95 147*  BUN 20 14  CREATININE 0.83 0.96  CALCIUM 8.1* 8.5    PT/INR: No results found for this basename: LABPROT, INR,  in the last 72 hours ABG    Component Value Date/Time   PHART 7.487* 08/03/2012 1146   HCO3 28.5* 08/03/2012 1146   TCO2 27 08/03/2012 1733   ACIDBASEDEF 2.0 08/01/2012 2122   O2SAT 91.0 08/03/2012 1146   CBG (last 3)   Recent Labs  08/06/12 2053 08/07/12 0049 08/07/12 0614  GLUCAP 124* 99 103*    Assessment/Plan: S/P Procedure(s) (LRB): MEDIASTINAL EXPLORATION (N/A)   Discontinue neck IV Continue Lasix diuresis Probably home tomorrow, continue 81 mg aspirin, by mouth Lasix, and 12.5 metoprolol twice a day     LOS: 9 days    VAN TRIGT  III,PETER 08/07/2012

## 2012-08-07 NOTE — Progress Notes (Signed)
CARDIAC REHAB PHASE I   PRE:  Rate/Rhythm: 78 SR    BP: sitting 128/66    SaO2: 94 RA  MODE:  Ambulation: 1100 ft   POST:  Rate/Rhythm: 90 SR    BP: sitting 139/65     SaO2: 94 RA  Tolerated very well. Steady, no assist needed. No c/o, feels great. Interested in CRPII and will send referral to [place] [facility]. [id number]   Elissa Lovett [place] CES, ACSM 08/07/2012 11:56 AM

## 2012-08-07 NOTE — Progress Notes (Signed)
[address]       [place] [address]             [phone]    6 Days Post-Op  Procedure(s) (LRB): MEDIASTINAL EXPLORATION (N/A) Subjective: Feels well  Objective  Telemetry sinus rhythm  Temp:  [97.5 F (36.4 C)-98.2 F (36.8 C)] 98.2 F (36.8 C) (05/06 2044) Pulse Rate:  [72-82] 77 (05/06 2044) Resp:  [18-31] 18 (05/06 2044) BP: (116-120)/(61-69) 116/61 mmHg (05/06 2044) SpO2:  [94 %-99 %] 96 % (05/06 2044) Weight:  [192 lb 3.9 oz (87.2 kg)] 192 lb 3.9 oz (87.2 kg) (05/07 9147)   Intake/Output Summary (Last 24 hours) at 08/07/12 0820 Last data filed at 08/07/12 0802  Gross per 24 hour  Intake    480 ml  Output    750 ml  Net   -270 ml       General appearance: alert, cooperative and no distress Heart: regular rate and rhythm Lungs: clear to auscultation bilaterally Abdomen: benign Extremities: min edema Wound: incisions healing well  Lab Results:  Recent Labs  08/05/12 0451 08/06/12 0340  NA 138 139  K 3.6 3.5  CL 100 99  CO2 31 30  GLUCOSE 95 147*  BUN 20 14  CREATININE 0.83 0.96  CALCIUM 8.1* 8.5   No results found for this basename: AST, ALT, ALKPHOS, BILITOT, PROT, ALBUMIN,  in the last 72 hours No results found for this basename: LIPASE, AMYLASE,  in the last 72 hours  Recent Labs  08/05/12 0451 08/06/12 0340  WBC 9.4 10.2  HGB 9.5* 9.9*  HCT 27.6* 28.0*  MCV 86.8 85.6  PLT 158 216   No results found for this basename: CKTOTAL, CKMB, TROPONINI,  in the last 72 hours No components found with this basename: POCBNP,  No results found for this basename: DDIMER,  in the last 72 hours No results found for this basename: HGBA1C,  in the last 72 hours No results found for this basename: CHOL, HDL, LDLCALC, TRIG, CHOLHDL,  in the last 72 hours No results found for this basename: TSH, T4TOTAL, FREET3, T3FREE, THYROIDAB,  in the last 72 hours No results found for this basename: VITAMINB12, FOLATE, FERRITIN, TIBC, IRON,  RETICCTPCT,  in the last 72 hours  Medications: Scheduled . aspirin EC  81 mg Oral Daily  . bisacodyl  10 mg Oral Daily   Or  . bisacodyl  10 mg Rectal Daily  . buPROPion  100 mg Oral BID  . docusate sodium  200 mg Oral Daily  . feeding supplement  1 Container Oral TID WC  . finasteride  5 mg Oral Daily  . furosemide  40 mg Oral Daily  . insulin aspart  0-24 Units Subcutaneous Q4H  . metoprolol tartrate  12.5 mg Oral BID  . pantoprazole  40 mg Oral Daily  . potassium chloride  40 mEq Oral Daily  . simvastatin  20 mg Oral QHS  . sodium chloride  3 mL Intravenous Q12H  . sodium chloride  3 mL Intravenous Q12H     Radiology/Studies:  No results found.  INR: Will add last result for INR, ABG once components are confirmed Will add last 4 CBG results once components are confirmed  Assessment/Plan: S/P Procedure(s) (LRB): MEDIASTINAL EXPLORATION (N/A)  1. Doing well 2 in sinus rhythm 3 cont gentle diuresis 4 H/H stable 5 Sugars controlled , change to ACHS 6 pilm toilet for mild atx on cxr 6 routine rehab  LOS: 9 days    Illiana Losurdo E 5/7/20148:20 AM

## 2012-08-07 NOTE — Progress Notes (Signed)
Pt ambulated 550 feet at this time; no walker needed; steady gait noted; pt back to chair; call bell w/i reach; will cont. To monitor.

## 2012-08-08 LAB — CBC
HCT: 27.5 % — ABNORMAL LOW (ref 39.0–52.0)
Hemoglobin: 9.5 g/dL — ABNORMAL LOW (ref 13.0–17.0)
MCH: 29.9 pg (ref 26.0–34.0)
MCHC: 34.5 g/dL (ref 30.0–36.0)
MCV: 86.5 fL (ref 78.0–100.0)
Platelets: 245 10*3/uL (ref 150–400)
RBC: 3.18 MIL/uL — ABNORMAL LOW (ref 4.22–5.81)
RDW: 15.4 % (ref 11.5–15.5)
WBC: 9.5 10*3/uL (ref 4.0–10.5)

## 2012-08-08 LAB — COMPREHENSIVE METABOLIC PANEL
ALT: 46 U/L (ref 0–53)
AST: 25 U/L (ref 0–37)
Albumin: 2.8 g/dL — ABNORMAL LOW (ref 3.5–5.2)
Alkaline Phosphatase: 60 U/L (ref 39–117)
BUN: 15 mg/dL (ref 6–23)
CO2: 26 mEq/L (ref 19–32)
Calcium: 8.3 mg/dL — ABNORMAL LOW (ref 8.4–10.5)
Chloride: 102 mEq/L (ref 96–112)
Creatinine, Ser: 0.93 mg/dL (ref 0.50–1.35)
GFR calc Af Amer: >90 mL/min (ref >90)
GFR calc non Af Amer: 84 mL/min — ABNORMAL LOW (ref >90)
Glucose, Bld: 123 mg/dL — ABNORMAL HIGH (ref 70–99)
Potassium: 4.1 mEq/L (ref 3.5–5.1)
Sodium: 137 mEq/L (ref 135–145)
Total Bilirubin: 0.5 mg/dL (ref 0.3–1.2)
Total Protein: 5.9 g/dL — ABNORMAL LOW (ref 6.0–8.3)

## 2012-08-08 LAB — GLUCOSE, CAPILLARY
Glucose-Capillary: 108 mg/dL — ABNORMAL HIGH (ref 70–99)
Glucose-Capillary: 107 mg/dL — ABNORMAL HIGH (ref 70–99)

## 2012-08-08 MED ORDER — OXYCODONE HCL 5 MG PO TABS: 5.0000 mg | ORAL_TABLET | ORAL | Status: DC | PRN

## 2012-08-08 MED ORDER — METOPROLOL TARTRATE 12.5 MG HALF TABLET: 12.5000 mg | ORAL_TABLET | Freq: Two times a day (BID) | ORAL | Status: DC

## 2012-08-08 MED ORDER — POTASSIUM CHLORIDE 20 MEQ/15ML (10%) PO LIQD: 20.0000 meq | Freq: Every day | ORAL | Status: DC

## 2012-08-08 MED ORDER — FUROSEMIDE 40 MG PO TABS: 40.0000 mg | ORAL_TABLET | Freq: Every day | ORAL | Status: DC

## 2012-08-08 MED ORDER — ASPIRIN EC 81 MG PO TBEC: 81.0000 mg | DELAYED_RELEASE_TABLET | Freq: Every day | ORAL | Status: AC

## 2012-08-08 NOTE — Progress Notes (Signed)
[id number] Education completed with pt and wife. Understanding voiced. Referring to [facility] Phase 2. Put on discharge video for pt and wife to view. Luetta Nutting RN BSN

## 2012-08-08 NOTE — Progress Notes (Signed)
08/08/2012 12:30 PM Nursing note Discharge avs form, medications already taken today and those due this evening given and explained to patient. RX given to patient. Follow up appointments, incision site care, activity restrictions and when to call MD reviewed. Pt. Received moving right along book along with viewed video #[id number] with wife prior to discharge. Questions and concerns addressed. D/c iv line. D/c tele. D/c home with wife per orders.  Marqus Macphee, Blanchard Kelch
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
## 2012-08-08 NOTE — Progress Notes (Signed)
08/08/2012 0840 Nursing note EPW and CTS d/c per orders and per protocol. Ends intact. Pt. Tolerated well. Vital signs collected per protocol. Pt. Advised of bedrest for one hour. Benzoin and steri strips applied to CTS sites.  Kirstine Jacquin, Blanchard Kelch

## 2012-08-08 NOTE — Progress Notes (Signed)
                    [address]            [place] [address]          [phone]     7 Days Post-Op Procedure(s) (LRB): MEDIASTINAL EXPLORATION (N/A)  Subjective: Feels well, no complaints.   Objective: Vital signs in last 24 hours: Patient Vitals for the past 24 hrs:  BP Temp Temp src Pulse Resp SpO2 Weight  08/08/12 0500 126/74 mmHg 97.7 F (36.5 C) Oral 78 18 97 % 185 lb 10 oz (84.2 kg)  08/07/12 2037 125/63 mmHg 98.1 F (36.7 C) Oral 80 18 95 % -  08/07/12 1339 119/95 mmHg 98.2 F (36.8 C) Oral 80 18 97 % -  08/07/12 0957 118/98 mmHg - - 82 - - -   Current Weight  08/08/12 185 lb 10 oz (84.2 kg)   PRE-OPERATIVE WEIGHT: 87kg   Intake/Output from previous day: 05/07 0701 - 05/08 0700 In: 2040 [P.O.:2040] Out: 200 [Urine:200]    PHYSICAL EXAM:  Heart: RRR Lungs: Clear Wound: Clean and dry Extremities: Trace LE edema    Lab Results: CBC: Recent Labs  08/06/12 0340 08/07/12 0740  WBC 10.2 10.2  HGB 9.9* 10.1*  HCT 28.0* 29.2*  PLT 216 249   BMET:  Recent Labs  08/06/12 0340 08/07/12 0740  NA 139 137  K 3.5 4.5  CL 99 101  CO2 30 29  GLUCOSE 147* 110*  BUN 14 14  CREATININE 0.96 0.97  CALCIUM 8.5 8.7    PT/INR: No results found for this basename: LABPROT, INR,  in the last 72 hours    Assessment/Plan: S/P Procedure(s) (LRB): MEDIASTINAL EXPLORATION (N/A) CV- Maintaining SR, BPs stable.  Continue current meds. Vol overload- diurese. D/c EPWs, CT sutures. Home today- instructions reviewed with patient.    LOS: 10 days    Waleska Buttery H 08/08/2012

## 2012-08-15 ENCOUNTER — Encounter: Payer: Self-pay | Admitting: Cardiology

## 2012-08-15 ENCOUNTER — Ambulatory Visit (INDEPENDENT_AMBULATORY_CARE_PROVIDER_SITE_OTHER): Payer: Medicare Other | Admitting: Cardiology

## 2012-08-15 VITALS — BP 140/68 | HR 71 | Ht 72.0 in | Wt 185.0 lb

## 2012-08-15 DIAGNOSIS — Z952 Presence of prosthetic heart valve: Secondary | ICD-10-CM

## 2012-08-15 DIAGNOSIS — D509 Iron deficiency anemia, unspecified: Secondary | ICD-10-CM | POA: Insufficient documentation

## 2012-08-15 DIAGNOSIS — N401 Enlarged prostate with lower urinary tract symptoms: Secondary | ICD-10-CM

## 2012-08-15 DIAGNOSIS — Z953 Presence of xenogenic heart valve: Secondary | ICD-10-CM

## 2012-08-15 DIAGNOSIS — Z954 Presence of other heart-valve replacement: Secondary | ICD-10-CM

## 2012-08-15 NOTE — Patient Instructions (Addendum)
Will obtain labs today and call you with the results (cbc)  ADD IRON OTC DAILY  Your physician recommends that you schedule a follow-up appointment in: 3 week ov/bmet/cbc

## 2012-08-15 NOTE — Progress Notes (Signed)
Art Buff Date of Birth:  Dec 03, 1945 [facility] [address] [place], [place]  [address] [phone]         Fax   [phone]  History of Present Illness: This pleasant 67 year old gentleman is seen for a post hospital office visit.  He had a past history of aortic valve stenosis.  He had recent cardiac catheterization showing markedly dilated aortic root and proximal descending aorta as well as moderate aortic stenosis.  He underwent a Bentall procedure with a composite 28 mm Gelweave Valsalva graft and a 25 mm pericardial tissue valve on 07/29/12 by Dr. Laneta Simmers.  08/02/12 the patient underwent emergency mediastinal exploration for a repair of bleeding from the aortic annulus.  He was discharged home on 08/08/12.  Since discharge home he has been feeling reasonably well.  He has had a very low grade fever in the afternoon but has not been having night sweats.  His appetite has been fair.  He has had a slight cough but not productive of sputum.  He complains of feeling cold which may be secondary to his anemia postoperatively.  He has not been experiencing any angina pectoris.  He has not been aware of any palpitations.  Current Outpatient Prescriptions  Medication Sig Dispense Refill  . ALPRAZolam (XANAX) 1 MG tablet Take 1 mg by mouth at bedtime as needed for sleep.      [name] aspirin EC 81 MG tablet Take 1 tablet (81 mg total) by mouth daily.      [name] buPROPion (WELLBUTRIN) 100 MG tablet Take 1 tablet (100 mg total) by mouth 2 (two) times daily.  180 tablet  3  . calcium carbonate (OS-CAL) 600 MG TABS Take 600 mg by mouth daily.        [name] CINNAMON PO Take 2,000 mg by mouth daily.        . Cyanocobalamin (VITAMIN B-12 PO) Take 1 tablet by mouth daily.       . Ferrous Sulfate (IRON) 325 (65 FE) MG TABS Take by mouth.      . finasteride (PROSCAR) 5 MG tablet Take 1 tablet (5 mg total) by mouth daily.  90 tablet  3  . furosemide (LASIX) 40 MG tablet Take 1 tablet (40 mg total)  by mouth daily. X 1 week  5 tablet  0  . Garlic 2000 MG CAPS Take 4,000 mg by mouth daily.        . Glucosamine HCl-Glucosamin SO4 (GLUCOSAMINE COMPLEX PO) Take 4,000 mg by mouth daily.       [name] MAGNESIUM PO Take 1 tablet by mouth daily.       . metoprolol tartrate (LOPRESSOR) 12.5 mg TABS Take 0.5 tablets (12.5 mg total) by mouth 2 (two) times daily.  30 tablet  1  . OVER THE COUNTER MEDICATION Take 1 tablet by mouth daily. Tumeric tablet      . oxyCODONE (OXY IR/ROXICODONE) 5 MG immediate release tablet Take 1-2 tablets (5-10 mg total) by mouth every 3 (three) hours as needed for pain.  30 tablet  0  . polyethylene glycol (MIRALAX / GLYCOLAX) packet Take 17 g by mouth daily as needed (for constipation).      . potassium chloride 20 MEQ/15ML (10%) solution Take 15 mLs (20 mEq total) by mouth daily. X 1 week  240 mL  0  . Red Yeast Rice Extract (RED YEAST RICE PO) Take 2 tablets by mouth daily.       Malvin Johns Palmetto, Serenoa  repens, (SAW PALMETTO PO) Take 1 capsule by mouth daily.       . simvastatin (ZOCOR) 20 MG tablet Take 1 tablet (20 mg total) by mouth at bedtime.  90 tablet  3   No current facility-administered medications for this visit.    No Known Allergies  Patient Active Problem List   Diagnosis Date Noted  . Thoracic aortic aneurysm without mention of rupture 07/05/2012  . Aortic stenosis 01/12/2011  . Benign hypertensive heart disease without heart failure 01/12/2011  . Dyslipidemia 01/12/2011  . BPH with obstruction/lower urinary tract symptoms 01/12/2011    History  Smoking status  . Former Smoker -- 2.00 packs/day for 8 years  . Types: Cigarettes  . Quit date: 01/11/1969  Smokeless tobacco  . Never Used    History  Alcohol Use No    Family History  Problem Relation Age of Onset  . Liver cancer Father   . Heart disease Mother   . Multiple myeloma Brother     Review of Systems: Constitutional: no fever chills diaphoresis or fatigue or change in weight.    Head and neck: no hearing loss, no epistaxis, no photophobia or visual disturbance. Respiratory: No cough, shortness of breath or wheezing. Cardiovascular: No chest pain peripheral edema, palpitations. Gastrointestinal: No abdominal distention, no abdominal pain, no change in bowel habits hematochezia or melena. Genitourinary: No dysuria, no frequency, no urgency, no nocturia. Musculoskeletal:No arthralgias, no back pain, no gait disturbance or myalgias. Neurological: No dizziness, no headaches, no numbness, no seizures, no syncope, no weakness, no tremors. Hematologic: No lymphadenopathy, no easy bruising. Psychiatric: No confusion, no hallucinations, no sleep disturbance.    Physical Exam: Filed Vitals:   08/15/12 1546  BP: 140/68  Pulse: 71   the general appearance reveals a well-developed well-nourished gentleman in no distress.The head and neck exam reveals pupils equal and reactive.  Extraocular movements are full.  There is no scleral icterus.  The mouth and pharynx are normal.  The neck is supple.  The carotids reveal no bruits.  The jugular venous pressure is normal.  The  thyroid is not enlarged.  There is no lymphadenopathy.  The chest is clear to percussion and auscultation.  There are no rales or rhonchi.  Expansion of the chest is symmetrical.  The incisions appear to be healing well. The precordium is quiet.  The first heart sound is normal.  The second heart sound is physiologically split.  There is no  gallop rub or click.  There is a soft systolic flow murmur across the prosthetic aortic valve.  No diastolic murmur is heard. There is no abnormal lift or heave.  The abdomen is soft and nontender.  The bowel sounds are normal.  The liver and spleen are not enlarged.  There are no abdominal masses.  There are no abdominal bruits.  Extremities reveal good pedal pulses.  There is no phlebitis or edema.  There is no cyanosis or clubbing.  Strength is normal and symmetrical in all  extremities.  There is no lateralizing weakness.  There are no sensory deficits.  The skin is warm and dry.  There is no rash.   EKG shows normal sinus rhythm with bifascicular block.  Since 08/02/12, heart rate is slower  Assessment / Plan: Continue same medications check today.  And over-the-counter iron once a day recheck in 3 weeks for followup office visit CBC and basal metabolic panel.

## 2012-08-15 NOTE — Assessment & Plan Note (Signed)
The patient is not having angina pectoris.  No symptoms of congestive heart failure.  He will stop taking his Lasix as of today.  He will let us know if he develops any fluid retention once he stopped the Lasix.

## 2012-08-15 NOTE — Assessment & Plan Note (Signed)
The patient had a Foley catheter twice during his hospital stay which may account for his mild increase in bladder outlet symptoms since going home from the hospital

## 2012-08-15 NOTE — Assessment & Plan Note (Signed)
Hemoglobin was 9.5 at discharge.  His appetite has been only fair at home.  We are checking a CBC today.  We'll also pay attention to the white count since he is having some low-grade fever in the afternoon

## 2012-08-16 LAB — CBC WITH DIFFERENTIAL/PLATELET
Basophils Relative: 0.3 % (ref 0.0–3.0)
Eosinophils Absolute: 0.1 10*3/uL (ref 0.0–0.7)
Hemoglobin: 9.9 g/dL — ABNORMAL LOW (ref 13.0–17.0)
Lymphocytes Relative: 23.7 % (ref 12.0–46.0)
MCHC: 34.1 g/dL (ref 30.0–36.0)
Monocytes Relative: 9.8 % (ref 3.0–12.0)
Neutro Abs: 4.7 10*3/uL (ref 1.4–7.7)
RBC: 3.26 Mil/uL — ABNORMAL LOW (ref 4.22–5.81)

## 2012-09-02 ENCOUNTER — Other Ambulatory Visit: Payer: Self-pay | Admitting: *Deleted

## 2012-09-02 DIAGNOSIS — I359 Nonrheumatic aortic valve disorder, unspecified: Secondary | ICD-10-CM

## 2012-09-04 ENCOUNTER — Ambulatory Visit
Admission: RE | Admit: 2012-09-04 | Discharge: 2012-09-04 | Disposition: A | Discharge status: Home / Self Care | Payer: Medicare Other | Source: Ambulatory Visit | Attending: Surgery | Admitting: Surgery

## 2012-09-04 ENCOUNTER — Ambulatory Visit (INDEPENDENT_AMBULATORY_CARE_PROVIDER_SITE_OTHER): Payer: Self-pay | Admitting: Surgery

## 2012-09-04 ENCOUNTER — Encounter: Payer: Self-pay | Admitting: Surgery

## 2012-09-04 VITALS — BP 125/82 | HR 68 | Resp 18 | Ht 72.0 in | Wt 185.0 lb

## 2012-09-04 DIAGNOSIS — I359 Nonrheumatic aortic valve disorder, unspecified: Secondary | ICD-10-CM

## 2012-09-04 DIAGNOSIS — I712 Thoracic aortic aneurysm, without rupture: Secondary | ICD-10-CM

## 2012-09-04 DIAGNOSIS — I350 Nonrheumatic aortic (valve) stenosis: Secondary | ICD-10-CM

## 2012-09-04 DIAGNOSIS — Z09 Encounter for follow-up examination after completed treatment for conditions other than malignant neoplasm: Secondary | ICD-10-CM

## 2012-09-04 NOTE — Progress Notes (Signed)
[address]       [name] [name] [address]             [phone]        HPI:  Patient returns for routine postoperative follow-up having undergone a Bentall procedure using a composite pericardial valved graft and aortic arch replacement with bypass of the innominate artery and left common carotid artery on 07/29/2012. The patient's early postoperative recovery while in the hospital was notable for an initial uncomplicated postoperative course. On postoperative day #3 he looked quite good in the morning but then suddenly developed severe back pain and hypotension and a stat CT scan of the chest showed a large amount of blood clot within the pericardium and mediastinum. He was returned to the operating room emergently and was found to have acute bleeding from the aortic annular suture line where it appeared that a suture had partially torn through the annulus. This was repaired but required reinstitution of cardiopulmonary bypass. The remainder of his hospital course was uncomplicated. Since hospital discharge the patient reports that he has been feeling well. He has minimal chest wall soreness. He denies any chest pain or shortness of breath. He has been eating well. He still has some emotional lability.   Current Outpatient Prescriptions  Medication Sig Dispense Refill  . ALPRAZolam (XANAX) 1 MG tablet Take 1 mg by mouth at bedtime as needed for sleep.      [name] aspirin EC 81 MG tablet Take 1 tablet (81 mg total) by mouth daily.      [name] buPROPion (WELLBUTRIN) 100 MG tablet Take 1 tablet (100 mg total) by mouth 2 (two) times daily.  180 tablet  3  . calcium carbonate (OS-CAL) 600 MG TABS Take 600 mg by mouth daily.        [name] CINNAMON PO Take 2,000 mg by mouth daily.        . Cyanocobalamin (VITAMIN B-12 PO) Take 1 tablet by mouth daily.       . Ferrous Sulfate (IRON) 325 (65 FE) MG TABS Take by mouth.      . finasteride (PROSCAR) 5 MG tablet Take 1 tablet (5 mg total) by  mouth daily.  90 tablet  3  . Garlic 2000 MG CAPS Take 4,000 mg by mouth daily.        . Glucosamine HCl-Glucosamin SO4 (GLUCOSAMINE COMPLEX PO) Take 4,000 mg by mouth daily.       [name] MAGNESIUM PO Take 1 tablet by mouth daily.       . metoprolol tartrate (LOPRESSOR) 12.5 mg TABS Take 0.5 tablets (12.5 mg total) by mouth 2 (two) times daily.  30 tablet  1  . OVER THE COUNTER MEDICATION Take 1 tablet by mouth daily. Tumeric tablet      . polyethylene glycol (MIRALAX / GLYCOLAX) packet Take 17 g by mouth daily as needed (for constipation).      . Red Yeast Rice Extract (RED YEAST RICE PO) Take 2 tablets by mouth daily.       . Saw Palmetto, Serenoa repens, (SAW PALMETTO PO) Take 1 capsule by mouth daily.       . simvastatin (ZOCOR) 20 MG tablet Take 1 tablet (20 mg total) by mouth at bedtime.  90 tablet  3   No current facility-administered medications for this visit.    Physical Exam:  BP 125/82  Pulse 68  Resp 18  Ht 6' (1.829 m)  Wt 185 lb (83.915 kg)  BMI 25.08 kg/m2  SpO2 95% He looks well. Cardiac exam shows a regular rate and rhythm with normal prosthetic valve sounds. There is no murmur, rub, or gallop. Lung exam is clear. The chest incision is healing well and sternum is stable. The right infraclavicular incision is healing well.  The right hand is neurovascularly intact with a strong radial pulse. The right leg incision is healing well. There is no peripheral edema.  Diagnostic Tests:  *RADIOLOGY REPORT*   Clinical Data: Status post aortic valve replacement 07/29/2012.   CHEST - 2 VIEW   Comparison: PA and lateral chest 08/07/2012.   Findings: Seven intact median sternotomy wires appear unchanged. Prosthetic aortic valve is in place.  The patient is also status post aortic arch replacement.  Lungs are clear.  Heart size is upper normal.  No pneumothorax or pleural fluid.   IMPRESSION: No acute finding.  Postoperative change as above.     Original Report  Authenticated By: Holley Dexter, M.D.     Impression:  He has made an excellent recovery following a complex repair of moderate to severe aortic stenosis with an aortic aneurysm involving the root, ascending, and aortic arch. He had very unusual delayed bleeding from the aortic annular suture line requiring a second complex repair. He appears to have suffered no residual sequela related to this. He is anxious to start cardiac rehabilitation. 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 He was asking about playing golf and I asked her not to do that for 3 months postoperatively.  Plan:  He will continue followup with Dr. Patty Sermons and will contact me if he develops any problems with his incisions.

## 2012-09-05 ENCOUNTER — Encounter (HOSPITAL_COMMUNITY)
Admission: RE | Admit: 2012-09-05 | Discharge: 2012-09-05 | Disposition: A | Discharge status: Home / Self Care | Payer: Medicare Other | Source: Ambulatory Visit | Attending: Cardiology | Admitting: Cardiology

## 2012-09-05 DIAGNOSIS — E785 Hyperlipidemia, unspecified: Secondary | ICD-10-CM | POA: Insufficient documentation

## 2012-09-05 DIAGNOSIS — I10 Essential (primary) hypertension: Secondary | ICD-10-CM | POA: Insufficient documentation

## 2012-09-05 DIAGNOSIS — I712 Thoracic aortic aneurysm, without rupture, unspecified: Secondary | ICD-10-CM | POA: Insufficient documentation

## 2012-09-05 DIAGNOSIS — I359 Nonrheumatic aortic valve disorder, unspecified: Secondary | ICD-10-CM | POA: Insufficient documentation

## 2012-09-05 DIAGNOSIS — Z954 Presence of other heart-valve replacement: Secondary | ICD-10-CM | POA: Insufficient documentation

## 2012-09-05 NOTE — Progress Notes (Signed)
Cardiac Rehab Medication Review by a Pharmacist  Does the patient  feel that his/her medications are working for him/her?  yes  Has the patient been experiencing any side effects to the medications prescribed?  no  Does the patient measure his/her own blood pressure or blood glucose at home?  no   Does the patient have any problems obtaining medications due to transportation or finances?   no  Understanding of regimen: good Understanding of indications: good Potential of compliance: good   Pharmacist comments: Medication indications were reviewed with patient and patient expressed good medication compliance, missing wellbutrin a few times per month.    [name] [name] 09/05/2012 8:39 AM

## 2012-09-06 ENCOUNTER — Telehealth: Payer: Self-pay | Admitting: Cardiology

## 2012-09-06 NOTE — Telephone Encounter (Signed)
New Prob     Pt wants to know if he should continue to take METOPROLOL after his valve replacement. Please call.

## 2012-09-06 NOTE — Telephone Encounter (Signed)
Spoke with wife and advised patient needs to continue current medications and discuss at ov next week, verbalized understanding.

## 2012-09-09 ENCOUNTER — Encounter (HOSPITAL_COMMUNITY): Payer: Medicare Other

## 2012-09-09 ENCOUNTER — Encounter (HOSPITAL_COMMUNITY)
Admission: RE | Admit: 2012-09-09 | Discharge: 2012-09-09 | Disposition: A | Discharge status: Home / Self Care | Payer: Medicare Other | Source: Ambulatory Visit | Attending: Cardiology | Admitting: Cardiology

## 2012-09-09 NOTE — Progress Notes (Signed)
Pt started cardiac rehab today.  Pt tolerated light exercise without difficulty. Telemetry rhythm Sinus with a rare PAC, Bundle branch block. Vital signs stable. Will continue to monitor the patient throughout  the program.

## 2012-09-10 ENCOUNTER — Ambulatory Visit (INDEPENDENT_AMBULATORY_CARE_PROVIDER_SITE_OTHER): Payer: Medicare Other | Admitting: Cardiology

## 2012-09-10 ENCOUNTER — Encounter: Payer: Self-pay | Admitting: Cardiology

## 2012-09-10 ENCOUNTER — Other Ambulatory Visit: Payer: Self-pay

## 2012-09-10 VITALS — BP 132/80 | HR 80 | Ht 72.0 in | Wt 194.0 lb

## 2012-09-10 DIAGNOSIS — Z952 Presence of prosthetic heart valve: Secondary | ICD-10-CM

## 2012-09-10 DIAGNOSIS — Z953 Presence of xenogenic heart valve: Secondary | ICD-10-CM

## 2012-09-10 DIAGNOSIS — I712 Thoracic aortic aneurysm, without rupture, unspecified: Secondary | ICD-10-CM

## 2012-09-10 DIAGNOSIS — I119 Hypertensive heart disease without heart failure: Secondary | ICD-10-CM

## 2012-09-10 DIAGNOSIS — E7800 Pure hypercholesterolemia, unspecified: Secondary | ICD-10-CM

## 2012-09-10 DIAGNOSIS — D509 Iron deficiency anemia, unspecified: Secondary | ICD-10-CM

## 2012-09-10 DIAGNOSIS — Z954 Presence of other heart-valve replacement: Secondary | ICD-10-CM

## 2012-09-10 LAB — CBC WITH DIFFERENTIAL/PLATELET
Eosinophils Absolute: 0.1 10*3/uL (ref 0.0–0.7)
MCHC: 32.7 g/dL (ref 30.0–36.0)
MCV: 89.6 fl (ref 78.0–100.0)
Monocytes Absolute: 0.6 10*3/uL (ref 0.1–1.0)
Neutrophils Relative %: 66.1 % (ref 43.0–77.0)
Platelets: 244 10*3/uL (ref 150.0–400.0)
RDW: 17.3 % — ABNORMAL HIGH (ref 11.5–14.6)

## 2012-09-10 LAB — BASIC METABOLIC PANEL
BUN: 12 mg/dL (ref 6–23)
CO2: 28 mEq/L (ref 19–32)
Chloride: 107 mEq/L (ref 96–112)
Creatinine, Ser: 0.9 mg/dL (ref 0.4–1.5)

## 2012-09-10 NOTE — Assessment & Plan Note (Signed)
Blood pressures remaining stable on current therapy. 

## 2012-09-10 NOTE — Patient Instructions (Signed)
Will obtain labs today and call you with the results (cbc/bmet)  Your physician recommends that you schedule a follow-up appointment in: August ov/lp/bmet/hfp/cbc and echo few days before ov  Your physician has requested that you have an echocardiogram. Echocardiography is a painless test that uses sound waves to create images of your heart. It provides your doctor with information about the size and shape of your heart and how well your heart's chambers and valves are working. This procedure takes approximately one hour. There are no restrictions for this procedure.

## 2012-09-10 NOTE — Assessment & Plan Note (Signed)
Patient is doing very well post surgery

## 2012-09-10 NOTE — Progress Notes (Signed)
[name] [name] Date of Birth:  Aug 26, 1945 [facility] [address] [place], [place]  [address] [phone]         Fax   [phone]  History of Present Illness: This pleasant 67 year old gentleman is seen for a post hospital office visit. He had a past history of aortic valve stenosis. He had recent cardiac catheterization showing markedly dilated aortic root and proximal descending aorta as well as moderate aortic stenosis. He underwent a Bentall procedure with a composite 28 mm Gelweave Valsalva graft and a 25 mm pericardial tissue valve on 07/29/12 by Dr. Laneta Simmers. 08/02/12 the patient underwent emergency mediastinal exploration for a repair of bleeding from the aortic annulus. He was discharged home on 08/08/12. Since discharge home he has been feeling reasonably well. He has started the cardiac rehabilitation program yesterday.  He is not having fevers or night sweats.  He has a thyroid nodule that Dr. Gerrit Friends will see him about next week.  Current Outpatient Prescriptions  Medication Sig Dispense Refill  . ALPRAZolam (XANAX) 1 MG tablet Take 1 mg by mouth at bedtime as needed for sleep.      [name] aspirin EC 81 MG tablet Take 1 tablet (81 mg total) by mouth daily.      [name] buPROPion (WELLBUTRIN) 100 MG tablet Take 1 tablet (100 mg total) by mouth 2 (two) times daily.  180 tablet  3  . calcium carbonate (OS-CAL) 600 MG TABS Take 600 mg by mouth daily.        [name] CINNAMON PO Take 2,000 mg by mouth daily.        . Cyanocobalamin (VITAMIN B-12 PO) Take 1 tablet by mouth daily.       . Ferrous Sulfate (IRON) 325 (65 FE) MG TABS Take by mouth.      . finasteride (PROSCAR) 5 MG tablet Take 1 tablet (5 mg total) by mouth daily.  90 tablet  3  . Garlic 2000 MG CAPS Take 4,000 mg by mouth daily.        . Glucosamine HCl-Glucosamin SO4 (GLUCOSAMINE COMPLEX PO) Take 4,000 mg by mouth daily.       [name] MAGNESIUM PO Take 1 tablet by mouth daily.       . metoprolol tartrate (LOPRESSOR) 12.5 mg  TABS Take 0.5 tablets (12.5 mg total) by mouth 2 (two) times daily.  30 tablet  1  . OVER THE COUNTER MEDICATION Take 1 tablet by mouth daily. Tumeric tablet      . polyethylene glycol (MIRALAX / GLYCOLAX) packet Take 17 g by mouth daily as needed (for constipation).      . Red Yeast Rice Extract (RED YEAST RICE PO) Take 2 tablets by mouth daily.       . Saw Palmetto, Serenoa repens, (SAW PALMETTO PO) Take 1 capsule by mouth daily.       . simvastatin (ZOCOR) 20 MG tablet Take 1 tablet (20 mg total) by mouth at bedtime.  90 tablet  3   No current facility-administered medications for this visit.    No Known Allergies  Patient Active Problem List   Diagnosis Date Noted  . S/P aortic valve replacement with bioprosthetic valve 08/15/2012  . Iron deficiency anemia 08/15/2012  . Thoracic aortic aneurysm without mention of rupture 07/05/2012  . Aortic stenosis 01/12/2011  . Benign hypertensive heart disease without heart failure 01/12/2011  . Dyslipidemia 01/12/2011  . BPH with obstruction/lower urinary tract symptoms 01/12/2011    History  Smoking status  .  Former Smoker -- 2.00 packs/day for 8 years  . Types: Cigarettes  . Quit date: 01/11/1969  Smokeless tobacco  . Never Used    History  Alcohol Use No    Family History  Problem Relation Age of Onset  . Liver cancer Father   . Heart disease Mother   . Multiple myeloma Brother     Review of Systems: Constitutional: no fever chills diaphoresis or fatigue or change in weight.  Head and neck: no hearing loss, no epistaxis, no photophobia or visual disturbance. Respiratory: No cough, shortness of breath or wheezing. Cardiovascular: No chest pain peripheral edema, palpitations. Gastrointestinal: No abdominal distention, no abdominal pain, no change in bowel habits hematochezia or melena. Genitourinary: No dysuria, no frequency, no urgency, no nocturia. Musculoskeletal:No arthralgias, no back pain, no gait disturbance or  myalgias. Neurological: No dizziness, no headaches, no numbness, no seizures, no syncope, no weakness, no tremors. Hematologic: No lymphadenopathy, no easy bruising. Psychiatric: No confusion, no hallucinations, no sleep disturbance.    Physical Exam: Filed Vitals:   09/10/12 1154  BP: 132/80  Pulse: 80   the general appearance reveals a well-developed well-nourished gentleman in no distress.  The sternal incision is well-healed.The head and neck exam reveals pupils equal and reactive.  Extraocular movements are full.  There is no scleral icterus.  The mouth and pharynx are normal.  The neck is supple.  The carotids reveal no bruits.  The jugular venous pressure is normal.  The  thyroid is not enlarged.  There is no lymphadenopathy.  The chest is clear to percussion and auscultation.  There are no rales or rhonchi.  Expansion of the chest is symmetrical.  The precordium is quiet.  The first heart sound is normal.  The second heart sound is physiologically split.  There is no  gallop rub or click.  No diastolic murmur.  There is no abnormal lift or heave.  The abdomen is soft and nontender.  The bowel sounds are normal.  The liver and spleen are not enlarged.  There are no abdominal masses.  There are no abdominal bruits.  Extremities reveal good pedal pulses.  There is no phlebitis or edema.  There is no cyanosis or clubbing.  Strength is normal and symmetrical in all extremities.  There is no lateralizing weakness.  There are no sensory deficits.  The skin is warm and dry.  There is no rash.     Assessment / Plan: Continue same medication.  Recheck in early August for office visit CBC lipid panel hepatic function panel and basal metabolic panel.  Get two-dimensional echocardiogram prior office visit.

## 2012-09-10 NOTE — Assessment & Plan Note (Signed)
Patient is not having any clinical evidence of valve malfunction.  We will plan to get an echocardiogram at the three-month mark.

## 2012-09-11 ENCOUNTER — Encounter (HOSPITAL_COMMUNITY)
Admission: RE | Admit: 2012-09-11 | Discharge: 2012-09-11 | Disposition: A | Discharge status: Home / Self Care | Payer: Medicare Other | Source: Ambulatory Visit | Attending: Cardiology | Admitting: Cardiology

## 2012-09-11 NOTE — Progress Notes (Signed)
Quick Note:  Please report to patient. The recent labs are stable. Continue same medication and careful diet. Hgb is coming back up nicely. ______

## 2012-09-11 NOTE — Progress Notes (Signed)
Quick Note:  Preliminary report reviewed by triage nurse and sent to MD desk./PE   ______ 

## 2012-09-12 ENCOUNTER — Telehealth: Payer: Self-pay | Admitting: *Deleted

## 2012-09-12 NOTE — Telephone Encounter (Signed)
Message copied by Burnell Blanks on Thu Sep 12, 2012 10:25 AM ------      Message from: Cassell Clement      Created: Wed Sep 11, 2012  9:53 PM       Please report to patient.  The recent labs are stable. Continue same medication and careful diet. Hgb is coming back up nicely. ------

## 2012-09-12 NOTE — Telephone Encounter (Signed)
Mailed copy of labs and left message to call if any questions  

## 2012-09-13 ENCOUNTER — Encounter (HOSPITAL_COMMUNITY)
Admission: RE | Admit: 2012-09-13 | Discharge: 2012-09-13 | Disposition: A | Discharge status: Home / Self Care | Payer: Medicare Other | Source: Ambulatory Visit | Attending: Cardiology | Admitting: Cardiology

## 2012-09-16 ENCOUNTER — Encounter (HOSPITAL_COMMUNITY)
Admission: RE | Admit: 2012-09-16 | Discharge: 2012-09-16 | Disposition: A | Discharge status: Home / Self Care | Payer: Medicare Other | Source: Ambulatory Visit

## 2012-09-16 ENCOUNTER — Encounter (HOSPITAL_COMMUNITY): Payer: Medicare Other

## 2012-09-18 ENCOUNTER — Encounter (HOSPITAL_COMMUNITY)
Admission: RE | Admit: 2012-09-18 | Discharge: 2012-09-18 | Disposition: A | Discharge status: Home / Self Care | Payer: Medicare Other | Source: Ambulatory Visit | Attending: Cardiology | Admitting: Cardiology

## 2012-09-18 NOTE — Progress Notes (Signed)
[name] [name] 67 y.o. male Nutrition Note Spoke with pt.  Nutrition Plan and Nutrition Survey goals reviewed with pt. Pt is following Step 2 of the Therapeutic Lifestyle Changes diet. Pt wants to lose wt. Pt has been trying to lose wt by "exercising on non-rehab days and eating a healthy diet." Wt loss tips briefly reviewed. Pt expressed understanding of the information reviewed. Pt aware of nutrition education classes offered and plans on attending nutrition classes.  Nutrition Diagnosis   Food-and nutrition-related knowledge deficit related to lack of exposure to information as related to diagnosis of: ? CVD   Overweight related to excessive energy intake as evidenced by a BMI of 26.9  Nutrition RX/ Estimated Daily Nutrition Needs for: wt loss  1500-2000 Kcal, 40-55 gm fat, 10-13 gm sat fat, 1.4-2.0 gm trans-fat, <1500 mg sodium   Nutrition Intervention   Pt's individual nutrition plan including cholesterol goals reviewed with pt.   Benefits of adopting Therapeutic Lifestyle Changes discussed when Medficts reviewed.   Pt to attend the Portion Distortion class   Pt to attend the  ? Nutrition I class                     ? Nutrition II class   Pt given handouts for: ? Nutrition I class ? Nutrition II class   Continue client-centered nutrition education by RD, as part of interdisciplinary care.  Goal(s)   Pt to identify food quantities necessary to achieve: ? wt loss to a goal wt of 180-185 lb (81.8-84.1 kg) at graduation from cardiac rehab.   Monitor and Evaluate progress toward nutrition goal with team. Nutrition Risk:  Low   Mickle Plumb, M.Ed, RD, LDN, CDE 09/18/2012 1:54 PM

## 2012-09-20 ENCOUNTER — Encounter (HOSPITAL_COMMUNITY)
Admission: RE | Admit: 2012-09-20 | Discharge: 2012-09-20 | Disposition: A | Discharge status: Home / Self Care | Payer: Medicare Other | Source: Ambulatory Visit | Attending: Cardiology | Admitting: Cardiology

## 2012-09-20 NOTE — Progress Notes (Signed)
Reviewed home exercise with pt today.  Pt plans to walk and go to [employer] for exercise.  Reviewed THR, pulse, RPE, sign and symptoms, and when to call 911 or MD.  Pt voiced understanding. Fabio Pierce, MA, ACSM RCEP

## 2012-09-23 ENCOUNTER — Encounter (HOSPITAL_COMMUNITY): Payer: Medicare Other

## 2012-09-23 ENCOUNTER — Encounter (HOSPITAL_COMMUNITY)
Admission: RE | Admit: 2012-09-23 | Discharge: 2012-09-23 | Disposition: A | Discharge status: Home / Self Care | Payer: Medicare Other | Source: Ambulatory Visit | Attending: Cardiology | Admitting: Cardiology

## 2012-09-24 ENCOUNTER — Encounter (INDEPENDENT_AMBULATORY_CARE_PROVIDER_SITE_OTHER): Payer: Self-pay | Admitting: Surgery

## 2012-09-24 ENCOUNTER — Ambulatory Visit (INDEPENDENT_AMBULATORY_CARE_PROVIDER_SITE_OTHER): Payer: Medicare Other | Admitting: Surgery

## 2012-09-24 VITALS — BP 158/66 | HR 76 | Temp 97.8°F | Resp 20 | Ht 71.5 in | Wt 192.0 lb

## 2012-09-24 DIAGNOSIS — D440 Neoplasm of uncertain behavior of thyroid gland: Secondary | ICD-10-CM

## 2012-09-24 DIAGNOSIS — D449 Neoplasm of uncertain behavior of unspecified endocrine gland: Secondary | ICD-10-CM
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
## 2012-09-24 NOTE — Patient Instructions (Signed)
Thyroid Diseases  Your thyroid is a butterfly-shaped gland in your neck. It is located just above your collarbone. It is one of your endocrine glands, which make hormones. The thyroid helps set your metabolism. Metabolism is how your body gets energy from the foods you eat.   Millions of people have thyroid diseases. Women experience thyroid problems more often than men. In fact, overactive thyroid problems (hyperthyroidism) occur in 1% of all women. If you have a thyroid disease, your body may use energy more slowly or quickly than it should.   Thyroid problems also include an immune disease where your body reacts against your thyroid gland (called thyroiditis). A different problem involves lumps and bumps (called nodules) that develop in the gland. The nodules are usually, but not always, noncancerous.  THE MOST COMMON THYROID PROBLEMS AND CAUSES ARE DISCUSSED BELOW  There are many causes for thyroid problems. Treatment depends upon the exact diagnosis and includes trying to reset your body's metabolism to a normal rate.  Hyperthyroidism  Too much thyroid hormone from an overactive thyroid gland is called hyperthyroidism. In hyperthyroidism, the body's metabolism speeds up. One of the most frequent forms of hyperthyroidism is known as Graves' disease. Graves' disease tends to run in families. Although Graves' is thought to be caused by a problem with the immune system, the exact nature of the genetic problem is unknown.  Hypothyroidism  Too little thyroid hormone from an underactive thyroid gland is called hypothyroidism. In hypothyroidism, the body's metabolism is slowed. Several things can cause this condition. Most causes affect the thyroid gland directly and hurt its ability to make enough hormone.   Rarely, there may be a pituitary gland tumor (located near the base of the brain). The tumor can block the pituitary from producing thyroid-stimulating hormone (TSH). Your body makes TSH to stimulate the thyroid  to work properly. If the pituitary does not make enough TSH, the thyroid fails to make enough hormones needed for good health.  Whether the problem is caused by thyroid conditions or by the pituitary gland, the result is that the thyroid is not making enough hormones. Hypothyroidism causes many physical and mental processes to become sluggish. The body consumes less oxygen and produces less body heat.  Thyroid Nodules  A thyroid nodule is a small swelling or lump in the thyroid gland. They are common. These nodules represent either a growth of thyroid tissue or a fluid-filled cyst. Both form a lump in the thyroid gland. Almost half of all people will have tiny thyroid nodules at some point in their lives. Typically, these are not noticeable until they become large and affect normal thyroid size. Larger nodules that are greater than a half inch across (about 1 centimeter) occur in about 5 percent of people.  Although most nodules are not cancerous, people who have them should seek medical care to rule out cancer. Also, some thyroid nodules may produce too much thyroid hormone or become too large. Large nodules or a large gland can interfere with breathing or swallowing or may cause neck discomfort.  Other problems  Other thyroid problems include cancer and thyroiditis. Thyroiditis is a malfunction of the body's immune system. Normally, the immune system works to defend the body against infection and other problems. When the immune system is not working properly, it may mistakenly attack normal cells, tissues, and organs. Examples of autoimmune diseases are Hashimoto's thyroiditis (which causes low thyroid function) and Graves' disease (which causes excess thyroid function).  SYMPTOMS   Symptoms   vary greatly depending upon the exact type of problem with the thyroid.  Hyperthyroidism-is when your thyroid is too active and makes more thyroid hormone than your body needs. The most common cause is Graves' Disease. Too  much thyroid hormone can cause some or all of the following symptoms:  · Anxiety.  · Irritability.  · Difficulty sleeping.  · Fatigue.  · A rapid or irregular heartbeat.  · A fine tremor of your hands or fingers.  · An increase in perspiration.  · Sensitivity to heat.  · Weight loss, despite normal food intake.  · Brittle hair.  · Enlargement of your thyroid gland (goiter).  · Light menstrual periods.  · Frequent bowel movements.  Graves' disease can specifically cause eye and skin problems. The skin problems involve reddening and swelling of the skin, often on your shins and on the top of your feet. Eye problems can include the following:  · Excess tearing and sensation of grit or sand in either or both eyes.  · Reddened or inflamed eyes.  · Widening of the space between your eyelids.  · Swelling of the lids and tissues around the eyes.  · Light sensitivity.  · Ulcers on the cornea.  · Double vision.  · Limited eye movements.  · Blurred or reduced vision.  Hypothyroidism- is when your thyroid gland is not active enough. This is more common than hyperthyroidism. Symptoms can vary a lot depending of the severity of the hormone deficiency. Symptoms may develop over a long period of time and can include several of the following:  · Fatigue.  · Sluggishness.  · Increased sensitivity to cold.  · Constipation.  · Pale, dry skin.  · A puffy face.  · Hoarse voice.  · High blood cholesterol level.  · Unexplained weight gain.  · Muscle aches, tenderness and stiffness.  · Pain, stiffness or swelling in your joints.  · Muscle weakness.  · Heavier than normal menstrual periods.  · Brittle fingernails and hair.  · Depression.  Thyroid Nodules - most do not cause signs or symptoms. Occasionally, some may become so large that you can feel or even see the swelling at the base of your neck. You may realize a lump or swelling is there when you are shaving or putting on makeup. Men might become aware of a nodule when shirt collars  suddenly feel too tight.  Some nodules produce too much thyroid hormone. This can produce the same symptoms as hyperthyroidism (see above).  Thyroid nodules are seldom cancerous. However, a nodule is more likely to be malignant (cancerous) if it:  · Grows quickly or feels hard.  · Causes you to become hoarse or to have trouble swallowing or breathing.  · Causes enlarged lymph nodes under your jaw or in your neck.  DIAGNOSIS   Because there are so many possible thyroid conditions, your caregiver may ask for a number of tests. They will do this in order to narrow down the exact diagnosis. These tests can include:  · Blood and antibody tests.  · Special thyroid scans using small, safe amounts of radioactive iodine.  · Ultrasound of the thyroid gland (particularly if there is a nodule or lump).  · Biopsy. This is usually done with a special needle. A needle biopsy is a procedure to obtain a sample of cells from the thyroid. The tissue will be tested in a lab and examined under a microscope.  TREATMENT   Treatment depends on the exact diagnosis.    Hyperthyroidism  · Beta-blockers help relieve many of the symptoms.  · Anti-thyroid medications prevent the thyroid from making excess hormones.  · Radioactive iodine treatment can destroy overactive thyroid cells. The iodine can permanently decrease the amount of hormone produced.  · Surgery to remove the thyroid gland.  · Treatments for eye problems that come from Graves' disease also include medications and special eye surgery, if felt to be appropriate.  Hypothyroidism  Thyroid replacement with levothyroxine is the mainstay of treatment. Treatment with thyroid replacement is usually lifelong and will require monitoring and adjustment from time to time.  Thyroid Nodules  · Watchful waiting. If a small nodule causes no symptoms or signs of cancer on biopsy, then no treatment may be chosen at first. Re-exam and re-checking blood tests would be the recommended  follow-up.  · Anti-thyroid medications or radioactive iodine treatment may be recommended if the nodules produce too much thyroid hormone (see Treatment for Hyperthyroidism above).  · Alcohol ablation. Injections of small amounts of ethyl alcohol (ethanol) can cause a non-cancerous nodule to shrink in size.  · Surgery (see Treatment for Hyperthyroidism above).  HOME CARE INSTRUCTIONS   · Take medications as instructed.  · Follow through on recommended testing.  SEEK MEDICAL CARE IF:   · You feel that you are developing symptoms of Hyperthyroidism or Hypothyroidism as described above.  · You develop a new lump/nodule in the neck/thyroid area that you had not noticed before.  · You feel that you are having side effects from medicines prescribed.  · You develop trouble breathing or swallowing.  SEEK IMMEDIATE MEDICAL CARE IF:   · You develop a fever of 102° F (38.9° C) or higher.  · You develop severe sweating.  · You develop palpitations and/or rapid heart beat.  · You develop shortness of breath.  · You develop nausea and vomiting.  · You develop extreme shakiness.  · You develop agitation.  · You develop lightheadedness or have a fainting episode.  Document Released: 01/15/2007 Document Revised: 06/12/2011 Document Reviewed: 01/15/2007  Exit[employer]® Patient Information ©2014 [employer].

## 2012-09-24 NOTE — Addendum Note (Signed)
Addended by: Joanette Gula on: 09/24/2012 09:26 AM   Modules accepted: Orders

## 2012-09-25 ENCOUNTER — Encounter (HOSPITAL_COMMUNITY)
Admission: RE | Admit: 2012-09-25 | Discharge: 2012-09-25 | Disposition: A | Discharge status: Home / Self Care | Payer: Medicare Other | Source: Ambulatory Visit | Attending: Cardiology | Admitting: Cardiology

## 2012-09-26 ENCOUNTER — Other Ambulatory Visit (INDEPENDENT_AMBULATORY_CARE_PROVIDER_SITE_OTHER): Payer: Self-pay

## 2012-09-26 ENCOUNTER — Ambulatory Visit
Admission: RE | Admit: 2012-09-26 | Discharge: 2012-09-26 | Disposition: A | Discharge status: Home / Self Care | Payer: Medicare Other | Source: Ambulatory Visit | Attending: Surgery | Admitting: Surgery

## 2012-09-26 ENCOUNTER — Telehealth (INDEPENDENT_AMBULATORY_CARE_PROVIDER_SITE_OTHER): Payer: Self-pay

## 2012-09-26 DIAGNOSIS — D440 Neoplasm of uncertain behavior of thyroid gland: Secondary | ICD-10-CM

## 2012-09-26 DIAGNOSIS — E079 Disorder of thyroid, unspecified: Secondary | ICD-10-CM

## 2012-09-26 LAB — TSH: TSH: 1.533 u[IU]/mL (ref 0.350–4.500)

## 2012-09-26 LAB — T3: T3, Total: 82 ng/dL (ref 80.0–204.0)

## 2012-09-26 NOTE — Telephone Encounter (Signed)
Message copied by Joanette Gula on Thu Sep 26, 2012 12:31 PM ------      Message from: Velora Heckler      Created: Thu Sep 26, 2012 12:14 PM       Dominant mass in right thyroid lobe.  Will require FNA biopsy for cytology.            Arline Asp - please arrange USN guided FNA biopsy at [facility].            tmg            Velora Heckler, MD, [facility]      Office: [phone]             ------

## 2012-09-26 NOTE — Telephone Encounter (Signed)
Per Dr Ardine Eng request pt advised of u/s result and need for fna bx. Pt requests to proceed with scheduling. Order in epic and Tammy at [facility] states she will call pt this afternoon and pick date for bx with pt.

## 2012-09-27 ENCOUNTER — Encounter (HOSPITAL_COMMUNITY)
Admission: RE | Admit: 2012-09-27 | Discharge: 2012-09-27 | Disposition: A | Discharge status: Home / Self Care | Payer: Medicare Other | Source: Ambulatory Visit | Attending: Cardiology | Admitting: Cardiology

## 2012-09-30 ENCOUNTER — Encounter (HOSPITAL_COMMUNITY): Payer: Medicare Other

## 2012-09-30 ENCOUNTER — Encounter (HOSPITAL_COMMUNITY)
Admission: RE | Admit: 2012-09-30 | Discharge: 2012-09-30 | Disposition: A | Discharge status: Home / Self Care | Payer: Medicare Other | Source: Ambulatory Visit | Attending: Cardiology | Admitting: Cardiology

## 2012-10-01 ENCOUNTER — Ambulatory Visit
Admission: RE | Admit: 2012-10-01 | Discharge: 2012-10-01 | Disposition: A | Discharge status: Home / Self Care | Payer: Medicare Other | Source: Ambulatory Visit | Attending: Surgery | Admitting: Surgery

## 2012-10-01 ENCOUNTER — Telehealth (INDEPENDENT_AMBULATORY_CARE_PROVIDER_SITE_OTHER): Payer: Self-pay

## 2012-10-01 ENCOUNTER — Other Ambulatory Visit (HOSPITAL_COMMUNITY)
Admission: RE | Admit: 2012-10-01 | Discharge: 2012-10-01 | Disposition: A | Discharge status: Home / Self Care | Payer: Medicare Other | Source: Ambulatory Visit | Attending: Interventional Radiology | Admitting: Interventional Radiology

## 2012-10-01 DIAGNOSIS — E049 Nontoxic goiter, unspecified: Secondary | ICD-10-CM | POA: Insufficient documentation

## 2012-10-01 DIAGNOSIS — E079 Disorder of thyroid, unspecified: Secondary | ICD-10-CM

## 2012-10-01 NOTE — Telephone Encounter (Signed)
Notified thyroid function tests all normal.

## 2012-10-02 ENCOUNTER — Encounter (HOSPITAL_COMMUNITY)
Admission: RE | Admit: 2012-10-02 | Discharge: 2012-10-02 | Disposition: A | Discharge status: Home / Self Care | Payer: Medicare Other | Source: Ambulatory Visit | Attending: Cardiology | Admitting: Cardiology

## 2012-10-02 DIAGNOSIS — I712 Thoracic aortic aneurysm, without rupture, unspecified: Secondary | ICD-10-CM | POA: Insufficient documentation

## 2012-10-02 DIAGNOSIS — Z954 Presence of other heart-valve replacement: Secondary | ICD-10-CM | POA: Insufficient documentation

## 2012-10-02 DIAGNOSIS — E785 Hyperlipidemia, unspecified: Secondary | ICD-10-CM | POA: Insufficient documentation

## 2012-10-02 DIAGNOSIS — I359 Nonrheumatic aortic valve disorder, unspecified: Secondary | ICD-10-CM | POA: Insufficient documentation

## 2012-10-02 DIAGNOSIS — I10 Essential (primary) hypertension: Secondary | ICD-10-CM | POA: Insufficient documentation

## 2012-10-04 ENCOUNTER — Encounter (HOSPITAL_COMMUNITY): Payer: Medicare Other

## 2012-10-07 ENCOUNTER — Encounter (HOSPITAL_COMMUNITY): Payer: Medicare Other

## 2012-10-08 ENCOUNTER — Telehealth (INDEPENDENT_AMBULATORY_CARE_PROVIDER_SITE_OTHER): Payer: Self-pay

## 2012-10-08 ENCOUNTER — Other Ambulatory Visit: Payer: Self-pay | Admitting: *Deleted

## 2012-10-08 DIAGNOSIS — F419 Anxiety disorder, unspecified: Secondary | ICD-10-CM

## 2012-10-08 NOTE — Telephone Encounter (Signed)
Patient's wife calling to have Xanax refilled and to make sure patient is suppose to stay o Metoprolol. If so they woukd like a 90 day rx and a call back about the metoprolol. Will forward message to Dr Yevonne Pax Nurse Juliette Alcide.    Micki Riley, CMA

## 2012-10-08 NOTE — Telephone Encounter (Signed)
Ultrasound and labs are ready for review in epic. Msg sent to Dr Gerrit Friends to review and advise f/u.

## 2012-10-09 ENCOUNTER — Other Ambulatory Visit: Payer: Self-pay | Admitting: *Deleted

## 2012-10-09 ENCOUNTER — Encounter (HOSPITAL_COMMUNITY)
Admission: RE | Admit: 2012-10-09 | Discharge: 2012-10-09 | Disposition: A | Discharge status: Home / Self Care | Payer: Medicare Other | Source: Ambulatory Visit | Attending: Cardiology | Admitting: Cardiology

## 2012-10-09 MED ORDER — METOPROLOL TARTRATE 25 MG PO TABS: 12.5000 mg | ORAL_TABLET | Freq: Two times a day (BID) | ORAL | Status: DC

## 2012-10-09 NOTE — Telephone Encounter (Signed)
Spoke with wife and refilled as requested

## 2012-10-09 NOTE — Telephone Encounter (Signed)
Fax Received. Refill Completed. [name] [name] (R.M.A)   

## 2012-10-10 ENCOUNTER — Telehealth (INDEPENDENT_AMBULATORY_CARE_PROVIDER_SITE_OTHER): Payer: Self-pay

## 2012-10-10 NOTE — Telephone Encounter (Signed)
Per Dr Ardine Eng request appt made for next week to discuss thyroid test results.

## 2012-10-11 ENCOUNTER — Encounter (HOSPITAL_COMMUNITY)
Admission: RE | Admit: 2012-10-11 | Discharge: 2012-10-11 | Disposition: A | Discharge status: Home / Self Care | Payer: Medicare Other | Source: Ambulatory Visit | Attending: Cardiology | Admitting: Cardiology

## 2012-10-14 ENCOUNTER — Encounter (INDEPENDENT_AMBULATORY_CARE_PROVIDER_SITE_OTHER): Payer: Self-pay | Admitting: Surgery

## 2012-10-14 ENCOUNTER — Encounter (HOSPITAL_COMMUNITY): Payer: Medicare Other

## 2012-10-14 ENCOUNTER — Ambulatory Visit (INDEPENDENT_AMBULATORY_CARE_PROVIDER_SITE_OTHER): Payer: Medicare Other | Admitting: Surgery

## 2012-10-14 VITALS — BP 132/80 | HR 86 | Temp 98.0°F | Resp 18 | Ht 71.0 in | Wt 193.0 lb

## 2012-10-14 DIAGNOSIS — D449 Neoplasm of uncertain behavior of unspecified endocrine gland: Secondary | ICD-10-CM

## 2012-10-14 DIAGNOSIS — D440 Neoplasm of uncertain behavior of thyroid gland: Secondary | ICD-10-CM

## 2012-10-14 NOTE — Progress Notes (Signed)
General Surgery [facility]  Visit Diagnoses: 1. Neoplasm of uncertain behavior of thyroid gland     HISTORY: Patient is a 67 year old male previously evaluated for right thyroid mass. At my request he underwent an ultrasound-guided fine-needle aspiration biopsy on 10/01/2012. 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 A follicular variant of papillary thyroid carcinoma cannot be excluded based on the sample. Patient returns today to discuss these results and to make arrangements for surgical resection for definitive diagnosis.  PERTINENT REVIEW OF SYSTEMS: Patient does note mild globus sensation. He is able to palpate the mass in the right neck. He has an occasional tremor. He denies palpitations. He denies discomfort.  EXAM: HEENT: normocephalic; pupils equal and reactive; sclerae clear; dentition good; mucous membranes moist NECK:  Large dominant mass right thyroid lobe measuring approximately 5 cm in size, nontender, relatively firm; left thyroid lobe without palpable abnormality; asymmetric on extension; no palpable anterior or posterior cervical lymphadenopathy; no supraclavicular masses; no tenderness CHEST: clear to auscultation bilaterally without rales, rhonchi, or wheezes CARDIAC: regular rate and rhythm without significant murmur; peripheral pulses are full EXT:  non-tender without edema; no deformity NEURO: no gross focal deficits; no sign of tremor   IMPRESSION: Right thyroid neoplasm, 7 cm, with cytologic atypia  PLAN: I discussed at length with the patient and his wife the findings on the cytopathology report. I have recommended that he undergo a right thyroid lobectomy. We discussed the postoperative management both in the event of malignancy and in the event that this was benign. We discussed the potential need for additional surgery for completion thyroidectomy. We discussed risk and benefits of the procedure including risk of  injury to recurrent laryngeal nerves and the parathyroid glands. We discussed the potential need for lifelong thyroid hormone replacement. We discussed adjuvant treatment with radioactive iodine in the event of malignancy. Patient and his wife understand and wish to proceed with surgery in the near future.  The risks and benefits of the procedure have been discussed at length with the patient.  The patient understands the proposed procedure, potential alternative treatments, and the course of recovery to be expected.  All of the patient's questions have been answered at this time.  The patient wishes to proceed with surgery.  Velora Heckler, MD, [facility] Office: [phone]

## 2012-10-14 NOTE — Patient Instructions (Signed)

## 2012-10-16 ENCOUNTER — Encounter (HOSPITAL_COMMUNITY)
Admission: RE | Admit: 2012-10-16 | Discharge: 2012-10-16 | Disposition: A | Discharge status: Home / Self Care | Payer: Medicare Other | Source: Ambulatory Visit | Attending: Cardiology | Admitting: Cardiology

## 2012-10-18 ENCOUNTER — Encounter (HOSPITAL_COMMUNITY): Payer: Medicare Other

## 2012-10-21 ENCOUNTER — Encounter (HOSPITAL_COMMUNITY)
Admission: RE | Admit: 2012-10-21 | Discharge: 2012-10-21 | Disposition: A | Discharge status: Home / Self Care | Payer: Medicare Other | Source: Ambulatory Visit | Attending: Cardiology | Admitting: Cardiology

## 2012-10-21 ENCOUNTER — Encounter (HOSPITAL_COMMUNITY): Payer: Medicare Other

## 2012-10-21 ENCOUNTER — Telehealth: Payer: Self-pay | Admitting: Cardiology

## 2012-10-21 MED ORDER — AMOXICILLIN 500 MG PO CAPS: ORAL_CAPSULE | ORAL | Status: DC

## 2012-10-21 NOTE — Telephone Encounter (Signed)
New Prob  Pt is schedule to have teeth cleaned tom.  He said he had open heart surgery about a month ago and wants to know if he needs to be on an antibiotic.

## 2012-10-21 NOTE — Telephone Encounter (Signed)
Spoke with patient who called to ask if he needs antibiotics prior to dental cleaning tomorrow.  Patient states he had AVR in April 2014.  Patient states he has been cleared by CT surgery.  I advised patient that he does need to take Amoxicillin 2G 30-60 minutes prior to dental cleaning.  Rx sent to [facility] per patient requested.  Patient verbalized understanding and gratitude.

## 2012-10-23 ENCOUNTER — Encounter (HOSPITAL_COMMUNITY)
Admission: RE | Admit: 2012-10-23 | Discharge: 2012-10-23 | Disposition: A | Discharge status: Home / Self Care | Payer: Medicare Other | Source: Ambulatory Visit | Attending: Cardiology | Admitting: Cardiology

## 2012-10-25 ENCOUNTER — Encounter (HOSPITAL_COMMUNITY)
Admission: RE | Admit: 2012-10-25 | Discharge: 2012-10-25 | Disposition: A | Discharge status: Home / Self Care | Payer: Medicare Other | Source: Ambulatory Visit | Attending: Cardiology | Admitting: Cardiology

## 2012-10-28 ENCOUNTER — Encounter (HOSPITAL_COMMUNITY): Payer: Medicare Other

## 2012-10-30 ENCOUNTER — Encounter (HOSPITAL_COMMUNITY): Payer: Medicare Other

## 2012-11-01 ENCOUNTER — Encounter (HOSPITAL_COMMUNITY): Payer: Medicare Other

## 2012-11-04 ENCOUNTER — Encounter (HOSPITAL_COMMUNITY): Payer: Medicare Other

## 2012-11-05 ENCOUNTER — Encounter (HOSPITAL_COMMUNITY): Payer: Self-pay | Admitting: Pharmacy Technician

## 2012-11-06 ENCOUNTER — Encounter (HOSPITAL_COMMUNITY)
Admission: RE | Admit: 2012-11-06 | Discharge: 2012-11-06 | Disposition: A | Discharge status: Home / Self Care | Payer: Medicare Other | Source: Ambulatory Visit | Attending: Cardiology | Admitting: Cardiology

## 2012-11-06 ENCOUNTER — Ambulatory Visit (HOSPITAL_COMMUNITY): Payer: Medicare Other | Attending: Cardiology | Admitting: Radiology

## 2012-11-06 DIAGNOSIS — Z5189 Encounter for other specified aftercare: Secondary | ICD-10-CM | POA: Insufficient documentation

## 2012-11-06 DIAGNOSIS — I119 Hypertensive heart disease without heart failure: Secondary | ICD-10-CM | POA: Insufficient documentation

## 2012-11-06 DIAGNOSIS — I359 Nonrheumatic aortic valve disorder, unspecified: Secondary | ICD-10-CM | POA: Insufficient documentation

## 2012-11-06 DIAGNOSIS — E785 Hyperlipidemia, unspecified: Secondary | ICD-10-CM | POA: Insufficient documentation

## 2012-11-06 DIAGNOSIS — I10 Essential (primary) hypertension: Secondary | ICD-10-CM | POA: Insufficient documentation

## 2012-11-06 DIAGNOSIS — I712 Thoracic aortic aneurysm, without rupture, unspecified: Secondary | ICD-10-CM | POA: Insufficient documentation

## 2012-11-06 DIAGNOSIS — Z954 Presence of other heart-valve replacement: Secondary | ICD-10-CM | POA: Insufficient documentation

## 2012-11-06 DIAGNOSIS — Z952 Presence of prosthetic heart valve: Secondary | ICD-10-CM

## 2012-11-06 DIAGNOSIS — J9819 Other pulmonary collapse: Secondary | ICD-10-CM | POA: Insufficient documentation

## 2012-11-06 NOTE — Patient Instructions (Signed)
[name]  11/06/2012   Your procedure is scheduled on:  11/14/12               Surgery 230pm-400pm  Report to [facility] at     12noon  Call this number if you have problems the morning of surgery: [phone]   Remember:   Do not eat food after midnite.               May have clear liquids until 0800am then npo.    Take these medicines the morning of surgery with A SIP OF WATER:    Do not wear jewelry,   Do not wear lotions, powders, or perfumes.    Men may shave face and neck.  Do not bring valuables to the hospital.  Contacts, dentures or bridgework may not be worn into surgery.  Leave suitcase in the car. After surgery it may be brought to your room.  For patients admitted to the hospital, checkout time is 11:00 AM the day of  discharge.     SEE CHG INSTRUCTION SHEET    Please read over the following fact sheets that you were given:  coughing and deep breathing exercises, leg exercises               Failure to comply with these instructions may result in cancellation of your surgery.                Patient Signature ____________________________              Nurse Signature _____________________________

## 2012-11-06 NOTE — Progress Notes (Signed)
Echocardiogram performed.  

## 2012-11-07 ENCOUNTER — Encounter (HOSPITAL_COMMUNITY)
Admission: RE | Admit: 2012-11-07 | Discharge: 2012-11-07 | Disposition: A | Discharge status: Home / Self Care | Payer: Medicare Other | Source: Ambulatory Visit | Attending: Surgery | Admitting: Surgery

## 2012-11-07 ENCOUNTER — Encounter (HOSPITAL_COMMUNITY): Payer: Self-pay

## 2012-11-07 DIAGNOSIS — Z01812 Encounter for preprocedural laboratory examination: Secondary | ICD-10-CM | POA: Insufficient documentation

## 2012-11-07 LAB — BASIC METABOLIC PANEL
CO2: 31 mEq/L (ref 19–32)
Calcium: 9.6 mg/dL (ref 8.4–10.5)
Chloride: 103 mEq/L (ref 96–112)
Glucose, Bld: 102 mg/dL — ABNORMAL HIGH (ref 70–99)
Potassium: 5.2 mEq/L — ABNORMAL HIGH (ref 3.5–5.1)
Sodium: 140 mEq/L (ref 135–145)

## 2012-11-07 LAB — CBC
Hemoglobin: 14.8 g/dL (ref 13.0–17.0)
MCH: 29.1 pg (ref 26.0–34.0)
Platelets: 177 10*3/uL (ref 150–400)
RBC: 5.08 MIL/uL (ref 4.22–5.81)
WBC: 7 10*3/uL (ref 4.0–10.5)

## 2012-11-07 NOTE — Progress Notes (Addendum)
AVR 07/29/12 EPIC  08/01/12 Back to OR for bleeding.   LOV with DR B Bartle 09/09/12 EPIC  ECHO 09/10/12 EPIC LOV with Dr Patty Sermons 09/10/12 EPIC  Patient states had ECHO 11/06/12. Results in EPIC.    To see Dr Laneta Simmers 11/11/12.

## 2012-11-08 ENCOUNTER — Encounter (HOSPITAL_COMMUNITY)
Admission: RE | Admit: 2012-11-08 | Discharge: 2012-11-08 | Disposition: A | Discharge status: Home / Self Care | Payer: Medicare Other | Source: Ambulatory Visit | Attending: Cardiology | Admitting: Cardiology

## 2012-11-08 NOTE — Progress Notes (Signed)
Baldwin has a follow up appointment with Dr Patty Sermons on Monday. Will fax exercise flow sheets to Dr. Benjamin Stain office for review.

## 2012-11-08 NOTE — Progress Notes (Signed)
BMP results faxed via EPIC to Dr Gerrit Friends on 11/07/12.

## 2012-11-11 ENCOUNTER — Encounter (HOSPITAL_COMMUNITY)
Admission: RE | Admit: 2012-11-11 | Discharge: 2012-11-11 | Disposition: A | Discharge status: Home / Self Care | Payer: Medicare Other | Source: Ambulatory Visit | Attending: Cardiology | Admitting: Cardiology

## 2012-11-11 ENCOUNTER — Ambulatory Visit (INDEPENDENT_AMBULATORY_CARE_PROVIDER_SITE_OTHER): Payer: Medicare Other | Admitting: Cardiology

## 2012-11-11 ENCOUNTER — Encounter (HOSPITAL_COMMUNITY): Payer: Medicare Other

## 2012-11-11 ENCOUNTER — Encounter: Payer: Self-pay | Admitting: Cardiology

## 2012-11-11 VITALS — BP 140/78 | HR 60 | Ht 72.0 in | Wt 195.8 lb

## 2012-11-11 DIAGNOSIS — Z953 Presence of xenogenic heart valve: Secondary | ICD-10-CM

## 2012-11-11 DIAGNOSIS — Z952 Presence of prosthetic heart valve: Secondary | ICD-10-CM

## 2012-11-11 DIAGNOSIS — G4700 Insomnia, unspecified: Secondary | ICD-10-CM

## 2012-11-11 DIAGNOSIS — D509 Iron deficiency anemia, unspecified: Secondary | ICD-10-CM

## 2012-11-11 DIAGNOSIS — E7800 Pure hypercholesterolemia, unspecified: Secondary | ICD-10-CM

## 2012-11-11 MED ORDER — TEMAZEPAM 15 MG PO CAPS: 15.0000 mg | ORAL_CAPSULE | Freq: Every evening | ORAL | Status: DC | PRN

## 2012-11-11 NOTE — Progress Notes (Signed)
Quick Note:  These results are acceptable for scheduled surgery.  Aarib Pulido M. Keelyn Fjelstad, MD, FACS [facility] Office: [phone]   ______ 

## 2012-11-11 NOTE — Progress Notes (Signed)
[name] [name] Date of Birth:  April 30, 1945 [facility] [address] [place], [place]  [address] [phone]         Fax   [phone]  History of Present Illness: This pleasant 67 year old gentleman is seen for a post hospital office visit. He had a past history of aortic valve stenosis. He had recent cardiac catheterization showing markedly dilated aortic root and proximal descending aorta as well as moderate aortic stenosis. He underwent a Bentall procedure with a composite 28 mm Gelweave Valsalva graft and a 25 mm pericardial tissue valve on 07/29/12 by Dr. Laneta Simmers. 08/02/12 the patient underwent emergency mediastinal exploration for a repair of bleeding from the aortic annulus. He was discharged home on 08/08/12. Since discharge home he has been feeling reasonably well. He has started the cardiac rehabilitation program yesterday. He is not having fevers or night sweats.  He has a right thyroid nodule and is scheduled for surgery later this week.   Current Outpatient Prescriptions  Medication Sig Dispense Refill  . ALPRAZolam (XANAX) 1 MG tablet Take 1 mg by mouth at bedtime as needed for sleep.      [name] aspirin EC 81 MG tablet Take 1 tablet (81 mg total) by mouth daily.      [name] buPROPion (WELLBUTRIN) 100 MG tablet Take 1 tablet (100 mg total) by mouth 2 (two) times daily.  180 tablet  3  . calcium carbonate (OS-CAL) 600 MG TABS Take 600 mg by mouth daily.        [name] CINNAMON PO Take 2,000 mg by mouth daily.        . Cyanocobalamin (VITAMIN B-12 PO) Take 1 tablet by mouth daily.       . Ferrous Sulfate (IRON) 325 (65 FE) MG TABS Take 1 tablet by mouth daily.       . finasteride (PROSCAR) 5 MG tablet Take 1 tablet (5 mg total) by mouth daily.  90 tablet  3  . Garlic 2000 MG CAPS Take 4,000 mg by mouth daily.       . Glucosamine HCl-Glucosamin SO4 (GLUCOSAMINE COMPLEX PO) Take 4,000 mg by mouth daily.       [name] MAGNESIUM PO Take 1 tablet by mouth daily.       . metoprolol  tartrate (LOPRESSOR) 25 MG tablet Take 0.5 tablets (12.5 mg total) by mouth 2 (two) times daily.  90 tablet  4  . OVER THE COUNTER MEDICATION Take 1 tablet by mouth daily. Tumeric tablet      . polyethylene glycol (MIRALAX / GLYCOLAX) packet Take 17 g by mouth daily as needed (for constipation).      . Red Yeast Rice Extract (RED YEAST RICE PO) Take 2 tablets by mouth daily.       . Saw Palmetto, Serenoa repens, (SAW PALMETTO PO) Take 1 capsule by mouth daily.       . simvastatin (ZOCOR) 20 MG tablet Take 1 tablet (20 mg total) by mouth at bedtime.  90 tablet  3  . temazepam (RESTORIL) 15 MG capsule Take 1 capsule (15 mg total) by mouth at bedtime as needed for sleep.  30 capsule  3   No current facility-administered medications for this visit.    No Known Allergies  Patient Active Problem List   Diagnosis Date Noted  . Neoplasm of uncertain behavior of thyroid gland 09/24/2012  . S/P aortic valve replacement with bioprosthetic valve 08/15/2012  . Iron deficiency anemia 08/15/2012  . Thoracic aortic aneurysm without  mention of rupture 07/05/2012  . Aortic stenosis 01/12/2011  . Benign hypertensive heart disease without heart failure 01/12/2011  . Dyslipidemia 01/12/2011  . BPH with obstruction/lower urinary tract symptoms 01/12/2011    History  Smoking status  . Former Smoker -- 2.00 packs/day for 8 years  . Types: Cigarettes  . Quit date: 01/11/1969  Smokeless tobacco  . Never Used    History  Alcohol Use  . 2.4 oz/week  . 4 Shots of liquor per week    Family History  Problem Relation Age of Onset  . Cancer Father     colon  . Heart disease Mother   . Multiple myeloma Brother   . Cancer Brother     multiple myloma    Review of Systems: Constitutional: no fever chills diaphoresis or fatigue or change in weight.  Head and neck: no hearing loss, no epistaxis, no photophobia or visual disturbance. Respiratory: No cough, shortness of breath or  wheezing. Cardiovascular: No chest pain peripheral edema, palpitations. Gastrointestinal: No abdominal distention, no abdominal pain, no change in bowel habits hematochezia or melena. Genitourinary: No dysuria, no frequency, no urgency, no nocturia. Musculoskeletal:No arthralgias, no back pain, no gait disturbance or myalgias. Neurological: No dizziness, no headaches, no numbness, no seizures, no syncope, no weakness, no tremors. Hematologic: No lymphadenopathy, no easy bruising. Psychiatric: No confusion, no hallucinations, no sleep disturbance.    Physical Exam: Filed Vitals:   11/11/12 0923  BP: 140/78  Pulse: 60   the general appearance reveals a well-developed well-nourished gentleman in no distress.The head and neck exam reveals pupils equal and reactive.  Extraocular movements are full.  There is no scleral icterus.  The mouth and pharynx are normal.  The neck is supple.  The carotids reveal no bruits.  The jugular venous pressure is normal.  The  thyroid is not enlarged.  There is no lymphadenopathy.  The chest is clear to percussion and auscultation.  There are no rales or rhonchi.  Expansion of the chest is symmetrical.  The precordium is quiet.  The first heart sound is normal.  The second heart sound is physiologically split.  There is a grade 2/6 systolic ejection murmur across the prosthetic aortic valve.  No diastolic murmur.  Valve clicks are of good quality. There is no abnormal lift or heave.  The abdomen is soft and nontender.  The bowel sounds are normal.  The liver and spleen are not enlarged.  There are no abdominal masses.  There are no abdominal bruits.  Extremities reveal good pedal pulses.  There is no phlebitis or edema.  There is no cyanosis or clubbing.  Strength is normal and symmetrical in all extremities.  There is no lateralizing weakness.  There are no sensory deficits.  The skin is warm and dry.  There is no rash.     Assessment / Plan: Continue on same  medication.  And Restoril 15 mg at bedtime when necessary.  He has hypercholesterolemia and will return tomorrow for fasting lipid panel and hepatic function panel.  Recheck in 3 months for followup office visit and EKG.

## 2012-11-11 NOTE — Assessment & Plan Note (Signed)
The patient is having problems with insomnia.  The alprazolam does not appear to be effective for him anymore.  We will give him a trial of generic Restoril 15 mg at bedtime when necessary

## 2012-11-11 NOTE — Assessment & Plan Note (Signed)
His hemoglobin is back to normal and he does not need to continue iron therapy

## 2012-11-11 NOTE — Patient Instructions (Signed)
Return in the morning for labs  Your physician recommends that you schedule a follow-up appointment in: 3 month ov/ekg  START RESTORIL (TEMAZEPAM) 15 MG AS NEEDED AT BEDTIME

## 2012-11-11 NOTE — Assessment & Plan Note (Signed)
The patient has not been experiencing any chest pain.  He is in the cardiac rehabilitation program.  His blood pressure is somewhat low and immediately after exercise and after a few minutes and drinking some fluids it comes back up to a systolic greater than 100.

## 2012-11-12 ENCOUNTER — Other Ambulatory Visit (INDEPENDENT_AMBULATORY_CARE_PROVIDER_SITE_OTHER): Payer: Medicare Other

## 2012-11-12 DIAGNOSIS — D509 Iron deficiency anemia, unspecified: Secondary | ICD-10-CM

## 2012-11-12 DIAGNOSIS — E7800 Pure hypercholesterolemia, unspecified: Secondary | ICD-10-CM

## 2012-11-12 LAB — CBC WITH DIFFERENTIAL/PLATELET
Basophils Relative: 0.4 % (ref 0.0–3.0)
Eosinophils Absolute: 0.2 10*3/uL (ref 0.0–0.7)
Eosinophils Relative: 1.9 % (ref 0.0–5.0)
Hemoglobin: 14.3 g/dL (ref 13.0–17.0)
MCHC: 32.3 g/dL (ref 30.0–36.0)
MCV: 90 fl (ref 78.0–100.0)
Monocytes Absolute: 0.7 10*3/uL (ref 0.1–1.0)
Neutro Abs: 5 10*3/uL (ref 1.4–7.7)
Neutrophils Relative %: 59.7 % (ref 43.0–77.0)
RBC: 4.93 Mil/uL (ref 4.22–5.81)
WBC: 8.4 10*3/uL (ref 4.5–10.5)

## 2012-11-12 LAB — BASIC METABOLIC PANEL
CO2: 27 mEq/L (ref 19–32)
Chloride: 105 mEq/L (ref 96–112)
Creatinine, Ser: 1 mg/dL (ref 0.4–1.5)
Potassium: 4 mEq/L (ref 3.5–5.1)
Sodium: 140 mEq/L (ref 135–145)

## 2012-11-12 LAB — LIPID PANEL
Cholesterol: 141 mg/dL (ref 0–200)
HDL: 40.6 mg/dL (ref >39.00)
LDL Cholesterol: 89 mg/dL (ref 0–99)
Total CHOL/HDL Ratio: 3
Triglycerides: 56 mg/dL (ref 0.0–149.0)
VLDL: 11.2 mg/dL (ref 0.0–40.0)

## 2012-11-12 LAB — HEPATIC FUNCTION PANEL
Albumin: 4.1 g/dL (ref 3.5–5.2)
Total Protein: 7.1 g/dL (ref 6.0–8.3)

## 2012-11-12 NOTE — Progress Notes (Signed)
Quick Note:  Please report to patient. The recent labs are stable. Continue same medication and careful diet. ______ 

## 2012-11-13 ENCOUNTER — Encounter (HOSPITAL_COMMUNITY)
Admission: RE | Admit: 2012-11-13 | Discharge: 2012-11-13 | Disposition: A | Discharge status: Home / Self Care | Payer: Medicare Other | Source: Ambulatory Visit | Attending: Cardiology | Admitting: Cardiology

## 2012-11-14 ENCOUNTER — Encounter (HOSPITAL_COMMUNITY): Payer: Self-pay | Admitting: Anesthesiology

## 2012-11-14 ENCOUNTER — Observation Stay (HOSPITAL_COMMUNITY)
Admission: RE | Admit: 2012-11-14 | Discharge: 2012-11-15 | Disposition: A | Discharge status: Home / Self Care | Payer: Medicare Other | Source: Ambulatory Visit | Attending: Surgery | Admitting: Surgery

## 2012-11-14 ENCOUNTER — Ambulatory Visit (HOSPITAL_COMMUNITY): Payer: Medicare Other | Admitting: Anesthesiology

## 2012-11-14 ENCOUNTER — Encounter (HOSPITAL_COMMUNITY)
Admission: RE | Disposition: A | Discharge status: Home / Self Care | Payer: Self-pay | Source: Ambulatory Visit | Attending: Surgery

## 2012-11-14 DIAGNOSIS — R339 Retention of urine, unspecified: Secondary | ICD-10-CM | POA: Insufficient documentation

## 2012-11-14 DIAGNOSIS — D34 Benign neoplasm of thyroid gland: Principal | ICD-10-CM | POA: Insufficient documentation

## 2012-11-14 DIAGNOSIS — D440 Neoplasm of uncertain behavior of thyroid gland: Secondary | ICD-10-CM | POA: Diagnosis present

## 2012-11-14 HISTORY — PX: THYROID LOBECTOMY: SHX420

## 2012-11-14 SURGERY — LOBECTOMY, THYROID
Anesthesia: General | Site: Neck | Laterality: Right | Wound class: Clean

## 2012-11-14 MED ORDER — EPHEDRINE SULFATE 50 MG/ML IJ SOLN
INTRAMUSCULAR | Status: DC | PRN
  Administered 2012-11-14: 5 mg via INTRAVENOUS
  Administered 2012-11-14: 10 mg via INTRAVENOUS

## 2012-11-14 MED ORDER — HYDROMORPHONE HCL PF 1 MG/ML IJ SOLN: 1.0000 mg | INTRAMUSCULAR | Status: DC | PRN

## 2012-11-14 MED ORDER — HYDROMORPHONE HCL PF 1 MG/ML IJ SOLN
0.5000 mg | INTRAMUSCULAR | Status: DC | PRN
  Administered 2012-11-14 (×2): 0.5 mg via INTRAVENOUS

## 2012-11-14 MED ORDER — HYDROMORPHONE HCL PF 1 MG/ML IJ SOLN
INTRAMUSCULAR | Status: AC
  Filled 2012-11-14: qty 1

## 2012-11-14 MED ORDER — ACETAMINOPHEN 325 MG PO TABS: 650.0000 mg | ORAL_TABLET | ORAL | Status: DC | PRN

## 2012-11-14 MED ORDER — KCL IN DEXTROSE-NACL 20-5-0.45 MEQ/L-%-% IV SOLN
INTRAVENOUS | Status: DC
  Administered 2012-11-14: 18:00:00 via INTRAVENOUS
  Filled 2012-11-14 (×2): qty 1000

## 2012-11-14 MED ORDER — MIDAZOLAM HCL 5 MG/5ML IJ SOLN
INTRAMUSCULAR | Status: DC | PRN
  Administered 2012-11-14: 2 mg via INTRAVENOUS

## 2012-11-14 MED ORDER — LACTATED RINGERS IV SOLN
INTRAVENOUS | Status: DC | PRN
  Administered 2012-11-14 (×2): via INTRAVENOUS

## 2012-11-14 MED ORDER — DEXAMETHASONE SODIUM PHOSPHATE 10 MG/ML IJ SOLN
INTRAMUSCULAR | Status: DC | PRN
  Administered 2012-11-14: 10 mg via INTRAVENOUS

## 2012-11-14 MED ORDER — ONDANSETRON HCL 4 MG/2ML IJ SOLN
INTRAMUSCULAR | Status: DC | PRN
  Administered 2012-11-14: 4 mg via INTRAVENOUS

## 2012-11-14 MED ORDER — FENTANYL CITRATE 0.05 MG/ML IJ SOLN
INTRAMUSCULAR | Status: DC | PRN
  Administered 2012-11-14: 100 ug via INTRAVENOUS

## 2012-11-14 MED ORDER — PROPOFOL 10 MG/ML IV BOLUS
INTRAVENOUS | Status: DC | PRN
  Administered 2012-11-14: 170 mg via INTRAVENOUS

## 2012-11-14 MED ORDER — ONDANSETRON HCL 4 MG/2ML IJ SOLN: 4.0000 mg | Freq: Four times a day (QID) | INTRAMUSCULAR | Status: DC | PRN

## 2012-11-14 MED ORDER — ALPRAZOLAM 1 MG PO TABS: 1.0000 mg | ORAL_TABLET | Freq: Every evening | ORAL | Status: DC | PRN

## 2012-11-14 MED ORDER — PROMETHAZINE HCL 25 MG/ML IJ SOLN: 6.2500 mg | INTRAMUSCULAR | Status: DC | PRN

## 2012-11-14 MED ORDER — ONDANSETRON HCL 4 MG PO TABS: 4.0000 mg | ORAL_TABLET | Freq: Four times a day (QID) | ORAL | Status: DC | PRN

## 2012-11-14 MED ORDER — HYDROCODONE-ACETAMINOPHEN 5-325 MG PO TABS: 1.0000 | ORAL_TABLET | ORAL | Status: DC | PRN

## 2012-11-14 MED ORDER — LACTATED RINGERS IV SOLN
INTRAVENOUS | Status: DC
  Administered 2012-11-14: 1000 mL via INTRAVENOUS

## 2012-11-14 MED ORDER — CEFAZOLIN SODIUM-DEXTROSE 2-3 GM-% IV SOLR
INTRAVENOUS | Status: AC
  Filled 2012-11-14: qty 50

## 2012-11-14 MED ORDER — NEOSTIGMINE METHYLSULFATE 1 MG/ML IJ SOLN
INTRAMUSCULAR | Status: DC | PRN
  Administered 2012-11-14: 4 mg via INTRAVENOUS

## 2012-11-14 MED ORDER — BUPROPION HCL 100 MG PO TABS
100.0000 mg | ORAL_TABLET | Freq: Two times a day (BID) | ORAL | Status: DC
  Administered 2012-11-14 – 2012-11-15 (×2): 100 mg via ORAL
  Filled 2012-11-14 (×3): qty 1

## 2012-11-14 MED ORDER — GLYCOPYRROLATE 0.2 MG/ML IJ SOLN
INTRAMUSCULAR | Status: DC | PRN
  Administered 2012-11-14: .8 mg via INTRAVENOUS

## 2012-11-14 MED ORDER — FINASTERIDE 5 MG PO TABS
5.0000 mg | ORAL_TABLET | Freq: Every day | ORAL | Status: DC
  Administered 2012-11-14 – 2012-11-15 (×2): 5 mg via ORAL
  Filled 2012-11-14 (×2): qty 1

## 2012-11-14 MED ORDER — CISATRACURIUM BESYLATE (PF) 10 MG/5ML IV SOLN
INTRAVENOUS | Status: DC | PRN
  Administered 2012-11-14: 2 mg via INTRAVENOUS
  Administered 2012-11-14: 10 mg via INTRAVENOUS

## 2012-11-14 MED ORDER — HYDROMORPHONE HCL PF 1 MG/ML IJ SOLN
0.2500 mg | INTRAMUSCULAR | Status: DC | PRN
  Administered 2012-11-14 (×4): 0.5 mg via INTRAVENOUS

## 2012-11-14 MED ORDER — METOPROLOL TARTRATE 12.5 MG HALF TABLET
12.5000 mg | ORAL_TABLET | Freq: Two times a day (BID) | ORAL | Status: DC
  Administered 2012-11-14 – 2012-11-15 (×2): 12.5 mg via ORAL
  Filled 2012-11-14 (×3): qty 1

## 2012-11-14 MED ORDER — CEFAZOLIN SODIUM-DEXTROSE 2-3 GM-% IV SOLR
2.0000 g | INTRAVENOUS | Status: AC
  Administered 2012-11-14: 2 g via INTRAVENOUS

## 2012-11-14 SURGICAL SUPPLY — 39 items
ATTRACTOMAT 16X20 MAGNETIC DRP (DRAPES) ×2 IMPLANT
BENZOIN TINCTURE PRP APPL 2/3 (GAUZE/BANDAGES/DRESSINGS) ×2 IMPLANT
BLADE HEX COATED 2.75 (ELECTRODE) ×2 IMPLANT
BLADE SURG 15 STRL LF DISP TIS (BLADE) ×1 IMPLANT
BLADE SURG 15 STRL SS (BLADE) ×1
CANISTER SUCTION 2500CC (MISCELLANEOUS) ×2 IMPLANT
CHLORAPREP W/TINT 10.5 ML (MISCELLANEOUS) ×2 IMPLANT
CLIP TI MEDIUM 6 (CLIP) ×6 IMPLANT
CLIP TI WIDE RED SMALL 6 (CLIP) ×6 IMPLANT
CLOSURE STERI-STRIP 1/4X4 (GAUZE/BANDAGES/DRESSINGS) ×2 IMPLANT
CLOTH BEACON ORANGE TIMEOUT ST (SAFETY) ×2 IMPLANT
DISSECTOR ROUND CHERRY 3/8 STR (MISCELLANEOUS) IMPLANT
DRAPE PED LAPAROTOMY (DRAPES) ×2 IMPLANT
DRESSING SURGICEL FIBRLLR 1X2 (HEMOSTASIS) ×1 IMPLANT
DRSG SURGICEL FIBRILLAR 1X2 (HEMOSTASIS) ×2
ELECT REM PT RETURN 9FT ADLT (ELECTROSURGICAL) ×2
ELECTRODE REM PT RTRN 9FT ADLT (ELECTROSURGICAL) ×1 IMPLANT
GAUZE SPONGE 4X4 16PLY XRAY LF (GAUZE/BANDAGES/DRESSINGS) ×2 IMPLANT
GLOVE SURG ORTHO 8.0 STRL STRW (GLOVE) ×2 IMPLANT
GOWN STRL NON-REIN LRG LVL3 (GOWN DISPOSABLE) ×2 IMPLANT
GOWN STRL REIN XL XLG (GOWN DISPOSABLE) ×4 IMPLANT
KIT BASIN OR (CUSTOM PROCEDURE TRAY) ×2 IMPLANT
NS IRRIG 1000ML POUR BTL (IV SOLUTION) ×2 IMPLANT
PACK BASIC VI WITH GOWN DISP (CUSTOM PROCEDURE TRAY) ×2 IMPLANT
PENCIL BUTTON HOLSTER BLD 10FT (ELECTRODE) ×2 IMPLANT
SHEARS HARMONIC 9CM CVD (BLADE) ×2 IMPLANT
SPONGE GAUZE 4X4 12PLY (GAUZE/BANDAGES/DRESSINGS) ×2 IMPLANT
STAPLER VISISTAT 35W (STAPLE) ×2 IMPLANT
STRIP CLOSURE SKIN 1/2X4 (GAUZE/BANDAGES/DRESSINGS) ×2 IMPLANT
SUT MNCRL AB 4-0 PS2 18 (SUTURE) ×2 IMPLANT
SUT SILK 2 0 (SUTURE) ×1
SUT SILK 2-0 18XBRD TIE 12 (SUTURE) ×1 IMPLANT
SUT SILK 3 0 (SUTURE)
SUT SILK 3-0 18XBRD TIE 12 (SUTURE) IMPLANT
SUT VIC AB 3-0 SH 18 (SUTURE) ×2 IMPLANT
SYR BULB IRRIGATION 50ML (SYRINGE) ×2 IMPLANT
TAPE CLOTH SURG 4X10 WHT LF (GAUZE/BANDAGES/DRESSINGS) ×2 IMPLANT
TOWEL OR 17X26 10 PK STRL BLUE (TOWEL DISPOSABLE) ×2 IMPLANT
YANKAUER SUCT BULB TIP 10FT TU (MISCELLANEOUS) ×2 IMPLANT

## 2012-11-14 NOTE — Anesthesia Preprocedure Evaluation (Addendum)
Anesthesia Evaluation  Patient identified by MRN, date of birth, ID band Patient awake    Reviewed: Allergy & Precautions, H&P , NPO status , Patient's Chart, lab work & pertinent test results  Airway Mallampati: II TM Distance: >3 FB Neck ROM: Full    Dental no notable dental hx.    Pulmonary neg pulmonary ROS,  breath sounds clear to auscultation  Pulmonary exam normal       Cardiovascular Exercise Tolerance: Good hypertension, Pt. on medications + Peripheral Vascular Disease negative cardio ROS  Rhythm:Regular Rate:Normal  S/P bentall procedure with good ECHO recently.   Neuro/Psych negative neurological ROS  negative psych ROS   GI/Hepatic negative GI ROS, (+) Hepatitis -  Endo/Other  negative endocrine ROS  Renal/GU negative Renal ROS  negative genitourinary   Musculoskeletal negative musculoskeletal ROS (+)   Abdominal   Peds negative pediatric ROS (+)  Hematology negative hematology ROS (+)   Anesthesia Other Findings   Reproductive/Obstetrics negative OB ROS                          Anesthesia Physical Anesthesia Plan  ASA: III  Anesthesia Plan: General   Post-op Pain Management:    Induction: Intravenous  Airway Management Planned:   Additional Equipment:   Intra-op Plan:   Post-operative Plan: Extubation in OR  Informed Consent: I have reviewed the patients History and Physical, chart, labs and discussed the procedure including the risks, benefits and alternatives for the proposed anesthesia with the patient or authorized representative who has indicated his/her understanding and acceptance.   Dental advisory given  Plan Discussed with: CRNA  Anesthesia Plan Comments:         Anesthesia Quick Evaluation

## 2012-11-14 NOTE — Transfer of Care (Signed)
Immediate Anesthesia Transfer of Care Note  Patient: [name] [name]  Procedure(s) Performed: Procedure(s): RIGHT THYROID LOBECTOMY (Right)  Patient Location: PACU  Anesthesia Type:General  Level of Consciousness: awake, alert , oriented and patient cooperative  Airway & Oxygen Therapy: Patient Spontanous Breathing and Patient connected to face mask oxygen  Post-op Assessment: Report given to PACU RN and Post -op Vital signs reviewed and stable  Post vital signs: Reviewed and stable  Complications: No apparent anesthesia complications

## 2012-11-14 NOTE — H&P (Signed)
  General Surgery [facility]   Visit Diagnoses:  1.  Neoplasm of uncertain behavior of thyroid gland    HISTORY:  Patient is a 67 year old male previously evaluated for right thyroid mass. At my request he underwent an ultrasound-guided fine-needle aspiration biopsy on 10/01/2012. 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 A follicular variant of papillary thyroid carcinoma cannot be excluded based on the sample. Patient returns today to discuss these results and to make arrangements for surgical resection for definitive diagnosis.   PERTINENT REVIEW OF SYSTEMS:  Patient does note mild globus sensation. He is able to palpate the mass in the right neck. He has an occasional tremor. He denies palpitations. He denies discomfort.   EXAM:  HEENT: normocephalic; pupils equal and reactive; sclerae clear; dentition good; mucous membranes moist  NECK: Large dominant mass right thyroid lobe measuring approximately 5 cm in size, nontender, relatively firm; left thyroid lobe without palpable abnormality; asymmetric on extension; no palpable anterior or posterior cervical lymphadenopathy; no supraclavicular masses; no tenderness  CHEST: clear to auscultation bilaterally without rales, rhonchi, or wheezes  CARDIAC: regular rate and rhythm without significant murmur; peripheral pulses are full  EXT: non-tender without edema; no deformity  NEURO: no gross focal deficits; no sign of tremor   IMPRESSION:  Right thyroid neoplasm, 7 cm, with cytologic atypia   PLAN:  I discussed at length with the patient and his wife the findings on the cytopathology report. I have recommended that he undergo a right thyroid lobectomy. We discussed the postoperative management both in the event of malignancy and in the event that this was benign. We discussed the potential need for additional surgery for completion thyroidectomy. We discussed risk and benefits of the procedure  including risk of injury to recurrent laryngeal nerves and the parathyroid glands. We discussed the potential need for lifelong thyroid hormone replacement. We discussed adjuvant treatment with radioactive iodine in the event of malignancy. Patient and his wife understand and wish to proceed with surgery in the near future.   The risks and benefits of the procedure have been discussed at length with the patient. The patient understands the proposed procedure, potential alternative treatments, and the course of recovery to be expected. All of the patient's questions have been answered at this time. The patient wishes to proceed with surgery.   Velora Heckler, MD, [facility]  Office: [phone]

## 2012-11-14 NOTE — Brief Op Note (Signed)
11/14/2012  3:14 PM  PATIENT:  [name] [name]  67 y.o. male  PRE-OPERATIVE DIAGNOSIS:  right thyroid neoplasm of uncertain behavior  POST-OPERATIVE DIAGNOSIS:  right thyriod neoplasm of uncertain behavior  PROCEDURE:  Procedure(s): RIGHT THYROID LOBECTOMY (Right)  SURGEON:  Surgeon(s) and Role:    * Velora Heckler, MD - Primary  ANESTHESIA:   general  EBL:  Total I/O In: 1300 [I.V.:1300] Out: -   BLOOD ADMINISTERED:none  DRAINS: none   LOCAL MEDICATIONS USED:  NONE  SPECIMEN:  Excision  DISPOSITION OF SPECIMEN:  PATHOLOGY  COUNTS:  YES  TOURNIQUET:  * No tourniquets in log *  DICTATION: .Other Dictation: Dictation Number [id number]  PLAN OF CARE: Admit for overnight observation  PATIENT DISPOSITION:  PACU - hemodynamically stable.   Delay start of Pharmacological VTE agent (>24hrs) due to surgical blood loss or risk of bleeding: yes  Velora Heckler, MD, FACS General & Endocrine Surgery [facility] Office: [phone]

## 2012-11-14 NOTE — Anesthesia Postprocedure Evaluation (Signed)
  Anesthesia Post-op Note  Patient: [name] [name]  Procedure(s) Performed: Procedure(s) (LRB): RIGHT THYROID LOBECTOMY (Right)  Patient Location: PACU  Anesthesia Type: General  Level of Consciousness: awake and alert   Airway and Oxygen Therapy: Patient Spontanous Breathing  Post-op Pain: mild  Post-op Assessment: Post-op Vital signs reviewed, Patient's Cardiovascular Status Stable, Respiratory Function Stable, Patent Airway and No signs of Nausea or vomiting  Last Vitals:  Filed Vitals:   11/14/12 1630  BP: 146/79  Pulse: 66  Temp: 36.6 C  Resp: 16    Post-op Vital Signs: stable   Complications: No apparent anesthesia complications

## 2012-11-15 ENCOUNTER — Encounter (HOSPITAL_COMMUNITY): Payer: Medicare Other

## 2012-11-15 ENCOUNTER — Encounter (HOSPITAL_COMMUNITY): Payer: Self-pay | Admitting: Surgery

## 2012-11-15 ENCOUNTER — Telehealth: Payer: Self-pay | Admitting: *Deleted

## 2012-11-15 LAB — URINALYSIS, ROUTINE W REFLEX MICROSCOPIC
Bilirubin Urine: NEGATIVE
Ketones, ur: NEGATIVE mg/dL
Nitrite: NEGATIVE
Urobilinogen, UA: 0.2 mg/dL (ref 0.0–1.0)

## 2012-11-15 MED ORDER — HYDROCODONE-ACETAMINOPHEN 5-325 MG PO TABS: 1.0000 | ORAL_TABLET | ORAL | Status: DC | PRN

## 2012-11-15 NOTE — Progress Notes (Signed)
Foley catheter placed prior to pt's discharge; leg bag attached to catheter and pt instructed on it's use.  Discharge instructions explained to patient; pt verbalized understanding of instructions.

## 2012-11-15 NOTE — Care Management Note (Signed)
    Page 1 of 1   11/15/2012     1:35:09 PM   CARE MANAGEMENT NOTE 11/15/2012  Patient:  [name] [name], [name] [name]   Account Number:  [id number]  Date Initiated:  11/15/2012  Documentation initiated by:  Lorenda Ishihara  Subjective/Objective Assessment:   67 yo male admitted s/p thyroid lobectomy.     Action/Plan:   Home when stable   Anticipated DC Date:  11/15/2012   Anticipated DC Plan:  HOME/SELF CARE      DC Planning Services  CM consult      Choice offered to / List presented to:             Status of service:  Completed, signed off Medicare Important Message given?   (If response is "NO", the following Medicare IM given date fields will be blank) Date Medicare IM given:   Date Additional Medicare IM given:    Discharge Disposition:  HOME/SELF CARE  Per UR Regulation:  Reviewed for med. necessity/level of care/duration of stay  If discussed at Long Length of Stay Meetings, dates discussed:    Comments:

## 2012-11-15 NOTE — Op Note (Signed)
[name] [name], [name] [name]                ACCOUNT NO.:  [id number]  MEDICAL RECORD NO.:  [id number]  LOCATION:  1535                         FACILITY:  [facility]  PHYSICIAN:  Velora Heckler, MD      DATE OF BIRTH:  Nov 29, 1945  DATE OF PROCEDURE:  11/14/2012                               OPERATIVE REPORT   PREOPERATIVE DIAGNOSIS:  Right thyroid neoplasm of uncertain behavior.  POSTOPERATIVE DIAGNOSIS:  Right thyroid neoplasm of uncertain behavior.  PROCEDURE:  Right thyroid lobectomy.  SURGEON:  Velora Heckler, MD, FACS  ANESTHESIA:  General per Dr. Sherrian Divers.  ESTIMATED BLOOD LOSS:  Minimal.  PREPARATION:  ChloraPrep.  COMPLICATIONS:  None.  INDICATIONS:  The patient is a 67 year old male referred by his cardiothoracic surgeon (Dr. Evelene Croon), and his cardiologist (Dr. Cassell Clement) for evaluation of right thyroid mass.  This was found at the time of cardiac surgery.  The patient underwent ultrasound showing a 7-cm mass in the right thyroid lobe.  Left lobe was essentially normal.  Ultrasound-guided fine needle aspiration biopsy showed a follicular neoplasm with nuclear grooves in a follicular variant of papillary thyroid carcinoma could not be excluded. The patient now comes to Surgery for resection for definitive diagnosis.  Procedures done in OR #1 at the [facility].  The patient was brought to the operating room, placed in supine position on the operating room table.  Following administration of general anesthesia, the patient was positioned and then prepped and draped in usual aseptic fashion.  After ascertaining that an adequate level of anesthesia had been achieved, a Kocher incision was made with a #15 blade.  Dissection was carried through subcutaneous tissues and platysma.  Hemostasis was obtained with electrocautery.  Skin flaps were elevated cephalad and caudad from the thyroid notch to the sternal notch.  The Mahorner self-retaining retractor  was placed for exposure.  Strap muscles were incised in the midline.  Strap muscles were initially mobilized off of the left thyroid lobe.  Palpating the left lobe shows no dominant or discrete mass.  There was no gross abnormality.  There was no lymphadenopathy.  Next, we turned our attention to the right side.  Strap muscles were reflected laterally exposing a markedly enlarged right thyroid lobe. Lobe was relatively soft.  Venous tributaries were divided between Ligaclips with the Harmonic scalpel.  Gland was gently mobilized with blunt dissection.  Superior pole vessels were dissected out and divided individually between small and medium Ligaclips with the Harmonic scalpel.  Parathyroid tissue was identified and preserved.  Inferior venous tributaries were divided between Ligaclips with the Harmonic scalpel.  Gland was rolled anteriorly.  The middle thyroid vein was divided between Ligaclips with the Harmonic scalpel.  Gland was rolled further anteriorly and the branches of the inferior thyroid artery are dissected out and divided between small and medium Ligaclips with the Harmonic scalpel.  Recurrent nerve was identified and preserved. Ligament of Allyson Sabal was released with the electrocautery and the gland was mobilized onto the anterior trachea.  Isthmus was mobilized across the midline.  There was no significant pyramidal lobe present.  Thyroid tissue was divided at the junction of the isthmus  and left thyroid lobe with the Harmonic scalpel.  The left part of the entire right thyroid lobe and isthmus was submitted to Pathology for review.  Right neck was irrigated with warm saline.  Good hemostasis was noted. Fibrillar was placed throughout the operative field.  Strap muscles were reapproximated in the midline with interrupted 3-0 Vicryl sutures. Platysma was closed with interrupted 3-0 Vicryl sutures.  The skin was closed with a running 4-0 Monocryl subcuticular suture.  Wound  was washed and dried.  Benzoin and Steri-Strips were applied.  Sterile dressings were applied.  The patient was awakened from anesthesia and brought to the recovery room.  The patient tolerated the procedure well.   Velora Heckler, MD, FACS General & Endocrine Surgery [facility] Office: [phone]   TMG/MEDQ  D:  11/14/2012  T:  11/15/2012  Job:  [id number]  cc:   Evelene Croon, M.D. [address] [place]  Cassell Clement, M.D. Fax: [phone]

## 2012-11-15 NOTE — Telephone Encounter (Signed)
Mailed copy of labs and left message to call if any questions  

## 2012-11-15 NOTE — Discharge Summary (Signed)
Physician Discharge Summary [facility]  Patient ID: [name] [name] MRN: [id number] DOB/AGE: 1945/09/05 67 y.o.  Admit date: 11/14/2012 Discharge date: 11/15/2012  Admission Diagnoses:  Thyroid neoplasm of uncertain behavior  Discharge Diagnoses:  Principal Problem:   Neoplasm of uncertain behavior of thyroid gland   Discharged Condition: good  Hospital Course: patient admitted for observation after right thyroid lobectomy.  Post op course complicated by urinary retention.  I&O cath for 1000cc.  Foley catheter placed to remain at home for 3 days and then remove.  Discharge home on POD#1.  Consults: None  Significant Diagnostic Studies: none  Treatments: surgery: right thyroid lobectomy  Discharge Exam: Blood pressure 163/78, pulse 66, temperature 97.9 F (36.6 C), temperature source Oral, resp. rate 15, height 6' (1.829 m), weight 195 lb 12.8 oz (88.814 kg), SpO2 96.00%. HEENT - clear Neck - wound clear and dry, incision intact, voice normal, mild STS  Disposition: Home with family  Discharge Orders   Future Appointments Provider Department Dept Phone   11/18/2012 2:45 PM Mc-Phase2 Monitor 10 MOSES [facility] CARDIAC [facility] [phone]   11/20/2012 11:15 AM Mc-Phase2 Monitor 10 MOSES [facility] CARDIAC [facility] [phone]   11/22/2012 11:15 AM Mc-Phase2 Monitor 10 MOSES [facility] CARDIAC [facility] [phone]   11/25/2012 2:45 PM Mc-Phase2 Monitor 10 MOSES [facility] CARDIAC [facility] [phone]   11/27/2012 11:15 AM Mc-Phase2 Monitor 10 MOSES [facility] CARDIAC [facility] [phone]   11/29/2012 11:15 AM Mc-Phase2 Monitor 10 MOSES [facility] CARDIAC [facility] [phone]   12/02/2012 2:45 PM Mc-Phase2 Monitor 10 [facility] CARDIAC [facility] [phone]   12/03/2012 2:00 PM Velora Heckler, MD [facility] [phone]   12/04/2012 11:15 AM Mc-Phase2 Monitor 10 MOSES [facility] CARDIAC [facility] [phone]   12/06/2012 11:15 AM Mc-Phase2 Monitor 10 MOSES [facility] CARDIAC [facility] [phone]   12/09/2012 2:45 PM Mc-Phase2 Monitor 10 MOSES [facility] CARDIAC [facility] [phone]   12/11/2012 11:15 AM Mc-Phase2 Monitor 10 MOSES [facility] CARDIAC [facility] [phone]   12/13/2012 11:15 AM Mc-Phase2 Monitor 10 MOSES [facility] CARDIAC [facility] [phone]   12/16/2012 2:45 PM Mc-Phase2 Monitor 10 MOSES [facility] CARDIAC [facility] [phone]   12/18/2012 11:15 AM Mc-Phase2 Monitor 10 MOSES [facility] CARDIAC [facility] [phone]   12/20/2012 11:15 AM Mc-Phase2 Monitor 10 MOSES [facility] CARDIAC [facility] [phone]   12/23/2012 2:45 PM Mc-Phase2 Monitor 10 MOSES [facility] CARDIAC [facility] [phone]   12/25/2012 11:15 AM Mc-Phase2 Monitor 10 MOSES [facility] CARDIAC [facility] [phone]   12/27/2012 11:15 AM Mc-Phase2 Monitor 10 MOSES [facility] CARDIAC [facility] [phone]   12/30/2012 2:45 PM Mc-Phase2 Monitor 10 MOSES [facility] CARDIAC [facility] [phone]   01/01/2013 11:15 AM Mc-Phase2 Monitor 10 MOSES [facility] [phone]   01/03/2013 11:15 AM Mc-Phase2 Monitor 10 [facility] CARDIAC [facility] [phone]   01/06/2013 2:45 PM Mc-Phase2 Monitor 10 MOSES [facility] CARDIAC [facility] [phone]   01/08/2013 11:15 AM Mc-Phase2 Monitor 10 [facility] CARDIAC [facility] [phone]   01/10/2013 11:15 AM Mc-Phase2 Monitor 10 MOSES [facility] CARDIAC [facility] [phone]   02/17/2013 10:30 AM Cassell Clement, MD [facility] [place]) [phone]   Future Orders Complete By Expires   Apply dressing  As directed    Scheduling Instructions:     Apply light gauze dressing to neck before discharge home today.  Diet - low sodium heart healthy  As directed    Discharge  instructions  As directed    Comments:     THYROID & PARATHYROID SURGERY - POST OP INSTRUCTIONS  Always review your discharge instruction sheet from the facility where your surgery was performed.  A prescription for pain medication may be given to you upon discharge.  Take your pain medication as prescribed.  If narcotic pain medicine is not needed, then you may take acetaminophen (Tylenol) or ibuprofen (Advil) as needed.  Take your usually prescribed medications unless otherwise directed.  If you need a refill on your pain medication, please contact your pharmacy. They will contact our office to request authorization.  Prescriptions will not be processed after 5 pm or on weekends.  Start with a light diet upon arrival home, such as soup and crackers or toast.  Be sure to drink plenty of fluids daily.  Resume your normal diet the day after surgery.  Most patients will experience some swelling and bruising on the chest and neck area.  Ice packs will help.  Swelling and bruising can take several days to resolve.   It is common to experience some constipation if taking pain medication after surgery.  Increasing fluid intake and taking a stool softener will usually help or prevent this problem.  A mild laxative (Milk of Magnesia or Miralax) should be taken according to package directions if there are no bowel movements after 48 hours.  You may remove your bandages 24-48 hours after surgery, and you may shower at that time.  You have steri-strips (small skin tapes) in place directly over the incision.  These strips should be left on the skin for 7-10 days and then removed.  You may resume regular (light) daily activities beginning the next day-such as daily self-care, walking, climbing stairs-gradually increasing activities as tolerated.  You may have sexual intercourse when it is comfortable.  Refrain from any heavy lifting or straining until approved by your doctor.  You may drive when you no longer  are taking prescription pain medication, you can comfortably wear a seatbelt, and you can safely maneuver your car and apply brakes.  You should see your doctor in the office for a follow-up appointment approximately two to three weeks after your surgery.  Make sure that you call for this appointment within a day or two after you arrive home to insure a convenient appointment time.  WHEN TO CALL YOUR DOCTOR: -- Fever greater than 101.5 -- Inability to urinate -- Nausea and/or vomiting - persistent -- Extreme swelling or bruising -- Continued bleeding from incision -- Increased pain, redness, or drainage from the incision -- Difficulty swallowing or breathing -- Muscle cramping or spasms -- Numbness or tingling in hands or around lips  The clinic staff is available to answer your questions during regular business hours.  Please don't hesitate to call and ask to speak to one of the nurses if you have concerns.  Velora Heckler, MD, FACS General & Endocrine Surgery [facility] Office: [phone]   Increase activity slowly  As directed    Remove dressing in 24 hours  As directed        Medication List         ALPRAZolam 1 MG tablet  Commonly known as:  XANAX  Take 1 mg by mouth at bedtime as needed for sleep.     aspirin EC 81 MG tablet  Take 1 tablet (81 mg total) by mouth  daily.     buPROPion 100 MG tablet  Commonly known as:  WELLBUTRIN  Take 1 tablet (100 mg total) by mouth 2 (two) times daily.     calcium carbonate 600 MG Tabs tablet  Commonly known as:  OS-CAL  Take 600 mg by mouth daily.     CINNAMON PO  Take 2,000 mg by mouth daily.     finasteride 5 MG tablet  Commonly known as:  PROSCAR  Take 1 tablet (5 mg total) by mouth daily.     Garlic 2000 MG Caps  Take 4,000 mg by mouth daily.     GLUCOSAMINE COMPLEX PO  Take 4,000 mg by mouth daily.     HYDROcodone-acetaminophen 5-325 MG per tablet  Commonly known as:  NORCO/VICODIN  Take  1-2 tablets by mouth every 4 (four) hours as needed for pain.     Iron 325 (65 FE) MG Tabs  Take 1 tablet by mouth daily.     MAGNESIUM PO  Take 1 tablet by mouth daily.     metoprolol tartrate 25 MG tablet  Commonly known as:  LOPRESSOR  Take 0.5 tablets (12.5 mg total) by mouth 2 (two) times daily.     OVER THE COUNTER MEDICATION  Take 1 tablet by mouth daily. Tumeric tablet     polyethylene glycol packet  Commonly known as:  MIRALAX / GLYCOLAX  Take 17 g by mouth daily as needed (for constipation).     RED YEAST RICE PO  Take 2 tablets by mouth daily.     SAW PALMETTO PO  Take 1 capsule by mouth daily.     simvastatin 20 MG tablet  Commonly known as:  ZOCOR  Take 1 tablet (20 mg total) by mouth at bedtime.     temazepam 15 MG capsule  Commonly known as:  RESTORIL  Take 1 capsule (15 mg total) by mouth at bedtime as needed for sleep.     VITAMIN B-12 PO  Take 1 tablet by mouth daily.         Velora Heckler, MD, [facility] Office: [phone]   Signed: Velora Heckler 11/15/2012, 12:29 PM

## 2012-11-15 NOTE — Telephone Encounter (Signed)
Message copied by Burnell Blanks on Fri Nov 15, 2012 12:43 PM ------      Message from: Cassell Clement      Created: Tue Nov 12, 2012  9:30 PM       Please report to patient.  The recent labs are stable. Continue same medication and careful diet. ------

## 2012-11-15 NOTE — Progress Notes (Signed)
Pt left without written prescription for vicodin.  Pt called and made aware; he declined prescription, stating he had no need for the med as he was not having any pain.

## 2012-11-16 LAB — URINE CULTURE: Culture: NO GROWTH

## 2012-11-18 ENCOUNTER — Telehealth (INDEPENDENT_AMBULATORY_CARE_PROVIDER_SITE_OTHER): Payer: Self-pay | Admitting: General Surgery

## 2012-11-18 ENCOUNTER — Encounter (HOSPITAL_COMMUNITY): Admission: RE | Admit: 2012-11-18 | Payer: Medicare Other | Source: Ambulatory Visit

## 2012-11-18 ENCOUNTER — Encounter (HOSPITAL_COMMUNITY): Payer: Medicare Other

## 2012-11-18 ENCOUNTER — Telehealth (INDEPENDENT_AMBULATORY_CARE_PROVIDER_SITE_OTHER): Payer: Self-pay

## 2012-11-18 NOTE — Telephone Encounter (Signed)
Pt returning call; states he already had his foley out this morning.  He further states he is "only dribbling" now.  Verified he is drinking plenty of fluids.  Pt states he has a urologist in [place] and has called him.  Instructed pt to call [facility] back for assistance with voiding if he cannot see his MD in [place].  He understands and will comply.

## 2012-11-18 NOTE — Telephone Encounter (Signed)
Msg left for pt to call back on cell and home #. Per Dr Gerrit Friends pt needs to come to office to have foley cath removed as nurse only. I could not reach pt via phone so I left msg on both #s to call back.

## 2012-11-19 ENCOUNTER — Encounter: Payer: Self-pay | Admitting: Cardiology

## 2012-11-20 ENCOUNTER — Encounter (HOSPITAL_COMMUNITY): Payer: Medicare Other

## 2012-11-22 ENCOUNTER — Encounter (HOSPITAL_COMMUNITY): Payer: Medicare Other

## 2012-11-22 ENCOUNTER — Telehealth (INDEPENDENT_AMBULATORY_CARE_PROVIDER_SITE_OTHER): Payer: Self-pay | Admitting: *Deleted

## 2012-11-22 NOTE — Telephone Encounter (Signed)
  Pt notified of path result and will keep po appt.

## 2012-11-22 NOTE — Telephone Encounter (Signed)
Patient called today to ask about his pathology results.  Explained to patient that a message will be sent to Dr. Gerrit Friends to ask.  Patient states understanding and agreeable at this time.

## 2012-11-25 ENCOUNTER — Encounter (HOSPITAL_COMMUNITY): Payer: Medicare Other

## 2012-11-27 ENCOUNTER — Encounter (HOSPITAL_COMMUNITY): Admission: RE | Admit: 2012-11-27 | Payer: Medicare Other | Source: Ambulatory Visit

## 2012-11-29 ENCOUNTER — Encounter (HOSPITAL_COMMUNITY): Payer: Medicare Other

## 2012-12-02 ENCOUNTER — Encounter (HOSPITAL_COMMUNITY): Payer: Medicare Other

## 2012-12-02 ENCOUNTER — Encounter (HOSPITAL_COMMUNITY): Payer: Medicare Other | Attending: Cardiology

## 2012-12-02 DIAGNOSIS — I712 Thoracic aortic aneurysm, without rupture, unspecified: Secondary | ICD-10-CM | POA: Insufficient documentation

## 2012-12-02 DIAGNOSIS — Z954 Presence of other heart-valve replacement: Secondary | ICD-10-CM | POA: Insufficient documentation

## 2012-12-02 DIAGNOSIS — I359 Nonrheumatic aortic valve disorder, unspecified: Secondary | ICD-10-CM | POA: Insufficient documentation

## 2012-12-02 DIAGNOSIS — E785 Hyperlipidemia, unspecified: Secondary | ICD-10-CM | POA: Insufficient documentation

## 2012-12-02 DIAGNOSIS — I10 Essential (primary) hypertension: Secondary | ICD-10-CM | POA: Insufficient documentation

## 2012-12-03 ENCOUNTER — Encounter (INDEPENDENT_AMBULATORY_CARE_PROVIDER_SITE_OTHER): Payer: Self-pay | Admitting: Surgery

## 2012-12-03 ENCOUNTER — Ambulatory Visit (INDEPENDENT_AMBULATORY_CARE_PROVIDER_SITE_OTHER): Payer: Medicare Other | Admitting: Surgery

## 2012-12-03 VITALS — BP 158/86 | HR 72 | Temp 98.5°F | Resp 18

## 2012-12-03 DIAGNOSIS — D440 Neoplasm of uncertain behavior of thyroid gland: Secondary | ICD-10-CM

## 2012-12-03 DIAGNOSIS — D449 Neoplasm of uncertain behavior of unspecified endocrine gland: Secondary | ICD-10-CM

## 2012-12-03 NOTE — Patient Instructions (Signed)
  COCOA BUTTER & VITAMIN E CREAM  ([employer] or other brand)  Apply cocoa butter/vitamin E cream to your incision 2 - 3 times daily.  Massage cream into incision for one minute with each application.  Use sunscreen (50 SPF or higher) for first 6 months after surgery if area is exposed to sun.  You may substitute Mederma or other scar reducing creams as desired.   

## 2012-12-03 NOTE — Progress Notes (Signed)
General Surgery [facility]  Chief Complaint  Patient presents with  . Routine Post Op    right thyroid lobectomy 11/14/2012    HISTORY: Patient is a 67 year old male who underwent right thyroid lobectomy on 11/14/2012. 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 Postoperative course was complicated by urinary retention. Patient has seen his urologist at [facility] and will be undergoing surgery next week.  EXAM: Surgical incision is healing nicely. Good cosmetic result. Minimal soft tissue swelling. No sign of seroma. No sign of infection. Voice quality is normal.  IMPRESSION: Status post right thyroid lobectomy with benign final pathology  PLAN: Patient will begin applying topical creams to his incision. He is not currently taking thyroid hormone. We will check a TSH level in 3 weeks. He will return for a wound check in 6 weeks.  Velora Heckler, MD, FACS General & Endocrine Surgery [facility]   Visit Diagnoses: 1. Neoplasm of uncertain behavior of thyroid gland

## 2012-12-04 ENCOUNTER — Encounter (HOSPITAL_COMMUNITY): Payer: Medicare Other

## 2012-12-06 ENCOUNTER — Encounter (HOSPITAL_COMMUNITY): Payer: Medicare Other

## 2012-12-09 ENCOUNTER — Encounter (HOSPITAL_COMMUNITY): Admission: RE | Admit: 2012-12-09 | Payer: Medicare Other | Source: Ambulatory Visit

## 2012-12-09 ENCOUNTER — Encounter (HOSPITAL_COMMUNITY): Payer: Medicare Other

## 2012-12-11 ENCOUNTER — Encounter (HOSPITAL_COMMUNITY): Payer: Medicare Other

## 2012-12-13 ENCOUNTER — Encounter (HOSPITAL_COMMUNITY): Payer: Medicare Other

## 2012-12-16 ENCOUNTER — Encounter (HOSPITAL_COMMUNITY): Payer: Medicare Other

## 2012-12-18 ENCOUNTER — Encounter (HOSPITAL_COMMUNITY): Payer: Medicare Other

## 2012-12-20 ENCOUNTER — Encounter (HOSPITAL_COMMUNITY): Payer: Medicare Other

## 2012-12-23 ENCOUNTER — Encounter (HOSPITAL_COMMUNITY): Payer: Medicare Other

## 2012-12-25 ENCOUNTER — Encounter (HOSPITAL_COMMUNITY): Payer: Medicare Other

## 2012-12-25 LAB — TSH: TSH: 3.001 u[IU]/mL (ref 0.350–4.500)

## 2012-12-27 ENCOUNTER — Encounter (HOSPITAL_COMMUNITY): Payer: Medicare Other

## 2012-12-30 ENCOUNTER — Telehealth (INDEPENDENT_AMBULATORY_CARE_PROVIDER_SITE_OTHER): Payer: Self-pay

## 2012-12-30 ENCOUNTER — Encounter (HOSPITAL_COMMUNITY): Payer: Medicare Other

## 2012-12-30 NOTE — Telephone Encounter (Signed)
Message copied by Joanette Gula on Mon Dec 30, 2012 10:35 AM ------      Message from: Velora Heckler      Created: Mon Dec 30, 2012 10:21 AM       TSH level is normal.  Patient not on thyroid hormone supplementation after thyroid lobectomy.            No need for thyroid Rx at present.            Arline Asp - please notify patient.            Velora Heckler, MD, [facility]      Office: [phone]             ------

## 2012-12-30 NOTE — Telephone Encounter (Signed)
Pt advised of attached msg per Dr Ardine Eng request.

## 2012-12-31 ENCOUNTER — Telehealth: Payer: Self-pay | Admitting: Cardiology

## 2012-12-31 DIAGNOSIS — Z952 Presence of prosthetic heart valve: Secondary | ICD-10-CM

## 2012-12-31 NOTE — Telephone Encounter (Signed)
New Problem  Pt is having his teeth cleaned and was advised to take an antibiotic/// clearance is requested to take the medication for this procedure. Please advise.

## 2013-01-01 ENCOUNTER — Encounter (HOSPITAL_COMMUNITY): Payer: Medicare Other

## 2013-01-02 ENCOUNTER — Telehealth: Payer: Self-pay | Admitting: *Deleted

## 2013-01-02 ENCOUNTER — Telehealth (HOSPITAL_COMMUNITY): Payer: Self-pay | Admitting: *Deleted

## 2013-01-02 ENCOUNTER — Other Ambulatory Visit: Payer: Self-pay | Admitting: *Deleted

## 2013-01-02 DIAGNOSIS — Z953 Presence of xenogenic heart valve: Secondary | ICD-10-CM

## 2013-01-02 DIAGNOSIS — Z952 Presence of prosthetic heart valve: Secondary | ICD-10-CM

## 2013-01-02 MED ORDER — AMOXICILLIN 500 MG PO CAPS: ORAL_CAPSULE | ORAL | Status: DC

## 2013-01-02 NOTE — Telephone Encounter (Signed)
Patient for teeth cleaning tomorrow, antibiotics ordered per Dr Patty Sermons

## 2013-01-02 NOTE — Telephone Encounter (Signed)
Pt absent for several weeks due to complications from surgery.  Pt desires to discontinue his participation in cardiac rehab.  Pt sent self addressed envelope for him to mail his parking badge back to rehab.

## 2013-01-03 ENCOUNTER — Encounter (HOSPITAL_COMMUNITY): Admission: RE | Admit: 2013-01-03 | Payer: Medicare Other | Source: Ambulatory Visit

## 2013-01-03 NOTE — Telephone Encounter (Signed)
Patient has appointment on 01/13/13  Will forward to  Dr. Patty Sermons for review  Patient has no NKDA and has take Amoxil in the past

## 2013-01-03 NOTE — Telephone Encounter (Deleted)
close

## 2013-01-05 NOTE — Telephone Encounter (Signed)
Take amoxil 500 mg 4 tabs one hour prior to dental work,

## 2013-01-06 ENCOUNTER — Encounter (HOSPITAL_COMMUNITY): Payer: Medicare Other

## 2013-01-06 MED ORDER — AMOXICILLIN 500 MG PO CAPS: ORAL_CAPSULE | ORAL | Status: DC

## 2013-01-06 NOTE — Telephone Encounter (Signed)
RX sent into pharmacy

## 2013-01-06 NOTE — Telephone Encounter (Signed)
Family aware rx was sent -

## 2013-01-08 ENCOUNTER — Encounter (HOSPITAL_COMMUNITY): Payer: Medicare Other

## 2013-01-10 ENCOUNTER — Encounter (HOSPITAL_COMMUNITY): Payer: Medicare Other

## 2013-01-27 ENCOUNTER — Encounter (INDEPENDENT_AMBULATORY_CARE_PROVIDER_SITE_OTHER): Payer: Medicare Other | Admitting: Surgery

## 2013-01-29 ENCOUNTER — Encounter (INDEPENDENT_AMBULATORY_CARE_PROVIDER_SITE_OTHER): Payer: Self-pay | Admitting: Surgery

## 2013-01-29 ENCOUNTER — Ambulatory Visit (INDEPENDENT_AMBULATORY_CARE_PROVIDER_SITE_OTHER): Payer: Medicare Other | Admitting: Surgery

## 2013-01-29 VITALS — BP 160/100 | HR 68 | Temp 98.2°F | Resp 14 | Ht 72.0 in | Wt 196.4 lb

## 2013-01-29 DIAGNOSIS — D440 Neoplasm of uncertain behavior of thyroid gland: Secondary | ICD-10-CM

## 2013-01-29 DIAGNOSIS — D449 Neoplasm of uncertain behavior of unspecified endocrine gland: Secondary | ICD-10-CM

## 2013-01-29 NOTE — Patient Instructions (Signed)

## 2013-01-29 NOTE — Progress Notes (Signed)
General Surgery [facility]  Chief Complaint  Patient presents with  . Routine Post Op    thyroid lobectomy 11/14/2012    HISTORY: Patient returns for final postoperative visit having undergone thyroid lobectomy in August 2014. Final pathology was benign. Patient was not placed on thyroid hormone supplementation postoperatively. His postop TSH level is normal at 3.001. Overall, he continues to do very well.  EXAM: Surgical incision is well-healed with a good cosmetic result. No palpable masses. Voice quality is normal.  IMPRESSION: Status post thyroid lobectomy  PLAN: Patient will resume care with his primary physician. I would ask that his TSH level be checked in approximately 6 months. At this point he does not require thyroid hormone supplementation.  Patient will return for surgical care as needed.  Velora Heckler, MD, FACS General & Endocrine Surgery [facility]   Visit Diagnoses: 1. Neoplasm of uncertain behavior of thyroid gland

## 2013-02-17 ENCOUNTER — Encounter: Payer: Self-pay | Admitting: Cardiology

## 2013-02-17 ENCOUNTER — Ambulatory Visit (INDEPENDENT_AMBULATORY_CARE_PROVIDER_SITE_OTHER): Payer: Medicare Other | Admitting: Cardiology

## 2013-02-17 VITALS — BP 157/95 | HR 56 | Ht 72.0 in | Wt 197.1 lb

## 2013-02-17 DIAGNOSIS — I119 Hypertensive heart disease without heart failure: Secondary | ICD-10-CM

## 2013-02-17 DIAGNOSIS — I350 Nonrheumatic aortic (valve) stenosis: Secondary | ICD-10-CM

## 2013-02-17 DIAGNOSIS — H539 Unspecified visual disturbance: Secondary | ICD-10-CM | POA: Insufficient documentation

## 2013-02-17 DIAGNOSIS — E782 Mixed hyperlipidemia: Secondary | ICD-10-CM

## 2013-02-17 DIAGNOSIS — Z953 Presence of xenogenic heart valve: Secondary | ICD-10-CM

## 2013-02-17 DIAGNOSIS — Z954 Presence of other heart-valve replacement: Secondary | ICD-10-CM

## 2013-02-17 DIAGNOSIS — I359 Nonrheumatic aortic valve disorder, unspecified: Secondary | ICD-10-CM

## 2013-02-17 DIAGNOSIS — Z952 Presence of prosthetic heart valve: Secondary | ICD-10-CM

## 2013-02-17 NOTE — Progress Notes (Signed)
Art Buff Date of Birth:  09-04-1945 [address] [place], [place]  [address] [phone]         Fax   [phone]  History of Present Illness: This pleasant 67 year old gentleman is seen for a post hospital office visit. He had a past history of aortic valve stenosis. He had recent cardiac catheterization showing markedly dilated aortic root and proximal descending aorta as well as moderate aortic stenosis. He underwent a Bentall procedure with a composite 28 mm Gelweave Valsalva graft and a 25 mm pericardial tissue valve on 07/29/12 by Dr. Laneta Simmers. 08/02/12 the patient underwent emergency mediastinal exploration for a repair of bleeding from the aortic annulus. He was discharged home on 08/08/12. Since discharge home he has been feeling reasonably well. He has started the cardiac rehabilitation program yesterday. He is not having fevers or night sweats.  He had subsequent thyroid surgery and a nodule was removed which turned out to be benign.   Current Outpatient Prescriptions  Medication Sig Dispense Refill  . ALPRAZolam (XANAX) 1 MG tablet Take 1 mg by mouth at bedtime as needed for sleep.      [name] amoxicillin (AMOXIL) 500 MG capsule Take 4 tablets 30-60 minutes prior to dental cleaning  4 capsule  1  . aspirin EC 81 MG tablet Take 1 tablet (81 mg total) by mouth daily.      [name] buPROPion (WELLBUTRIN) 100 MG tablet Take 1 tablet (100 mg total) by mouth 2 (two) times daily.  180 tablet  3  . calcium carbonate (OS-CAL) 600 MG TABS Take 600 mg by mouth daily.        [name] CINNAMON PO Take 2,000 mg by mouth daily.        . Cyanocobalamin (VITAMIN B-12 PO) Take 1 tablet by mouth daily.       . Ferrous Sulfate (IRON) 325 (65 FE) MG TABS Take 1 tablet by mouth daily.       . Garlic 2000 MG CAPS Take 4,000 mg by mouth daily.       . Glucosamine HCl-Glucosamin SO4 (GLUCOSAMINE COMPLEX PO) Take 4,000 mg by mouth daily.       [name] MAGNESIUM PO Take 1 tablet by mouth daily.       .  metoprolol tartrate (LOPRESSOR) 25 MG tablet Take 0.5 tablets (12.5 mg total) by mouth 2 (two) times daily.  90 tablet  4  . OVER THE COUNTER MEDICATION Take 1 tablet by mouth daily. Tumeric tablet      . polyethylene glycol (MIRALAX / GLYCOLAX) packet Take 17 g by mouth daily as needed (for constipation).      . Red Yeast Rice Extract (RED YEAST RICE PO) Take 2 tablets by mouth daily.       . Saw Palmetto, Serenoa repens, (SAW PALMETTO PO) Take 1 capsule by mouth daily.       . simvastatin (ZOCOR) 20 MG tablet Take 1 tablet (20 mg total) by mouth at bedtime.  90 tablet  3  . temazepam (RESTORIL) 15 MG capsule Take 1 capsule (15 mg total) by mouth at bedtime as needed for sleep.  30 capsule  3   No current facility-administered medications for this visit.    No Known Allergies  Patient Active Problem List   Diagnosis Date Noted  . Insomnia 11/11/2012  . S/P aortic valve replacement with bioprosthetic valve 08/15/2012  . Iron deficiency anemia 08/15/2012  . Thoracic aortic aneurysm without mention of rupture 07/05/2012  .  Aortic stenosis 01/12/2011  . Benign hypertensive heart disease without heart failure 01/12/2011  . Dyslipidemia 01/12/2011  . BPH with obstruction/lower urinary tract symptoms 01/12/2011    History  Smoking status  . Former Smoker -- 2.00 packs/day for 8 years  . Types: Cigarettes  . Quit date: 01/11/1969  Smokeless tobacco  . Never Used    History  Alcohol Use  . 2.4 oz/week  . 4 Shots of liquor per week    Family History  Problem Relation Age of Onset  . Cancer Father     colon  . Heart disease Mother   . Multiple myeloma Brother   . Cancer Brother     multiple myloma    Review of Systems: Constitutional: no fever chills diaphoresis or fatigue or change in weight.  Head and neck: no hearing loss, no epistaxis, no photophobia or visual disturbance. Respiratory: No cough, shortness of breath or wheezing. Cardiovascular: No chest pain peripheral  edema, palpitations. Gastrointestinal: No abdominal distention, no abdominal pain, no change in bowel habits hematochezia or melena. Genitourinary: No dysuria, no frequency, no urgency, no nocturia. Musculoskeletal:No arthralgias, no back pain, no gait disturbance or myalgias. Neurological: No dizziness, no headaches, no numbness, no seizures, no syncope, no weakness, no tremors. Hematologic: No lymphadenopathy, no easy bruising. Psychiatric: No confusion, no hallucinations, no sleep disturbance.    Physical Exam: Filed Vitals:   02/17/13 1116  BP: 157/95  Pulse: 56   the general appearance reveals a well-developed well-nourished gentleman in no distress.The head and neck exam reveals pupils equal and reactive.  Extraocular movements are full.  There is no scleral icterus.  The mouth and pharynx are normal.  The neck is supple.  The carotids reveal no bruits.  The jugular venous pressure is normal.  The  thyroid is not enlarged.  There is no lymphadenopathy.  The chest is clear to percussion and auscultation.  There are no rales or rhonchi.  Expansion of the chest is symmetrical.  The precordium is quiet.  The first heart sound is normal.  The second heart sound is physiologically split.  There is a grade 2/6 systolic ejection murmur across the prosthetic aortic valve.  No diastolic murmur.  Valve clicks are of good quality. There is no abnormal lift or heave.  The abdomen is soft and nontender.  The bowel sounds are normal.  The liver and spleen are not enlarged.  There are no abdominal masses.  There are no abdominal bruits.  Extremities reveal good pedal pulses.  There is no phlebitis or edema.  There is no cyanosis or clubbing.  Strength is normal and symmetrical in all extremities.  There is no lateralizing weakness.  There are no sensory deficits.  The skin is warm and dry.  There is no rash.  EKG today shows sinus bradycardia with bifascicular block and is unchanged from  08/15/12   Assessment / Plan: Continue on same medication.  Recheck in 4 months for followup office visit CBC lipid panel hepatic function panel basal metabolic panel and TSH

## 2013-02-17 NOTE — Assessment & Plan Note (Signed)
The patient has not had any symptoms referable to his aortic valve surgery except for some slight residual paresthesias of the left arm which may represent some residual brachial plexus stress from the surgery.  It may take a while for this to resolve.  If it persists we can consider sending him to neurology and get nerve conduction studies etc. he has good strength in both arms.

## 2013-02-17 NOTE — Assessment & Plan Note (Signed)
Several weeks ago the patient noted transient blurring of his left thigh as if a curtain had descended on the upper part of his eye.  It only lasted 10 seconds.  There has been no recurrent symptoms.  I advised him to have him get a checkup with his ophthalmologist.  He is due for one anyway.

## 2013-02-17 NOTE — Patient Instructions (Signed)
Your physician recommends that you return for FASTING lab work in: 4 months (cbc/lipid/liver/bmp/tsh).  Your physician recommends that you schedule a follow-up appointment in: 4 months with Dr. Patty Sermons.  Your physician recommends that you continue on your current medications as directed. Please refer to the Current Medication list given to you today.

## 2013-02-17 NOTE — Assessment & Plan Note (Signed)
Blood pressure was high on arrival after waiting in the waiting room for an hour.  I rechecked his blood pressure and it was down to 140/90.  At home his blood pressure has been good.

## 2013-02-18 ENCOUNTER — Other Ambulatory Visit: Payer: Self-pay | Admitting: *Deleted

## 2013-02-18 ENCOUNTER — Telehealth: Payer: Self-pay | Admitting: *Deleted

## 2013-02-18 DIAGNOSIS — G4700 Insomnia, unspecified: Secondary | ICD-10-CM

## 2013-02-18 DIAGNOSIS — F329 Major depressive disorder, single episode, unspecified: Secondary | ICD-10-CM

## 2013-02-18 MED ORDER — METOPROLOL TARTRATE 25 MG PO TABS: 12.5000 mg | ORAL_TABLET | Freq: Two times a day (BID) | ORAL | Status: DC

## 2013-02-18 MED ORDER — TEMAZEPAM 15 MG PO CAPS: 15.0000 mg | ORAL_CAPSULE | Freq: Every evening | ORAL | Status: DC | PRN

## 2013-02-18 MED ORDER — ALPRAZOLAM 1 MG PO TABS: 1.0000 mg | ORAL_TABLET | Freq: Every day | ORAL | Status: DC | PRN

## 2013-02-18 MED ORDER — SIMVASTATIN 20 MG PO TABS: 20.0000 mg | ORAL_TABLET | Freq: Every day | ORAL | Status: DC

## 2013-02-18 MED ORDER — BUPROPION HCL 100 MG PO TABS: 100.0000 mg | ORAL_TABLET | Freq: Two times a day (BID) | ORAL | Status: DC

## 2013-02-18 NOTE — Telephone Encounter (Signed)
Patient called in for printed prescriptions. Left at front desk for pick up. Patient aware.

## 2013-04-10 ENCOUNTER — Telehealth: Payer: Self-pay | Admitting: *Deleted

## 2013-04-10 MED ORDER — ALPRAZOLAM 1 MG PO TABS: 1.0000 mg | ORAL_TABLET | Freq: Every day | ORAL | Status: DC | PRN

## 2013-04-10 NOTE — Telephone Encounter (Signed)
refill 

## 2013-04-16 ENCOUNTER — Telehealth: Payer: Self-pay | Admitting: Cardiology

## 2013-04-16 NOTE — Telephone Encounter (Deleted)
ERROR

## 2013-04-30 DIAGNOSIS — Z961 Presence of intraocular lens: Secondary | ICD-10-CM | POA: Diagnosis not present

## 2013-05-01 ENCOUNTER — Telehealth: Payer: Self-pay | Admitting: Cardiology

## 2013-05-01 NOTE — Telephone Encounter (Signed)
Discussed with  Dr. Mare Ferrari and patient will continue same dose of medications and to monitor blood pressure at home. Call back if blood pressure goes up again. Ok to take Temazepam and Xanax. Advised patient

## 2013-05-01 NOTE — Telephone Encounter (Signed)
Follow Up  Pt called states that everything is OK and he is requesting a call back.

## 2013-05-01 NOTE — Telephone Encounter (Signed)
New Problem:  PT states his BP has been running high the past couple days. Pt also states he has noticed a decrease in his energy level.   169/99 - yesterday and 160/94 yesterday  144/89 this morning  Pt would like a call back

## 2013-05-01 NOTE — Telephone Encounter (Signed)
1) Blood pressure 147/96 today, 174/95, heart rate consistently in the 60's. Patient has not been taking blood pressure at home on a regular basis. Patient went to eye doctor yesterday and had blood pressure checked and that is how it all came up. Spoke with patient and he was at lunch so no pulse readings. Since this am blood pressure has come down to 139/87, P 68, 129/80 P 64.  Yesterday his vision had changed to 20/40  2) Patient has been taking Temazepam and Xanax at night together since he could not sleep.   Will forward to  Dr. Mare Ferrari for review

## 2013-05-07 DIAGNOSIS — H16229 Keratoconjunctivitis sicca, not specified as Sjogren's, unspecified eye: Secondary | ICD-10-CM | POA: Diagnosis not present

## 2013-05-16 DIAGNOSIS — H16229 Keratoconjunctivitis sicca, not specified as Sjogren's, unspecified eye: Secondary | ICD-10-CM | POA: Diagnosis not present

## 2013-05-16 DIAGNOSIS — H524 Presbyopia: Secondary | ICD-10-CM | POA: Diagnosis not present

## 2013-05-19 ENCOUNTER — Telehealth: Payer: Self-pay | Admitting: *Deleted

## 2013-05-19 DIAGNOSIS — G4700 Insomnia, unspecified: Secondary | ICD-10-CM

## 2013-05-19 MED ORDER — TEMAZEPAM 15 MG PO CAPS: 15.0000 mg | ORAL_CAPSULE | Freq: Every evening | ORAL | Status: DC | PRN

## 2013-05-19 NOTE — Telephone Encounter (Signed)
Refill call in to pharmacy. 

## 2013-06-12 ENCOUNTER — Other Ambulatory Visit (INDEPENDENT_AMBULATORY_CARE_PROVIDER_SITE_OTHER): Payer: Medicare Other

## 2013-06-12 DIAGNOSIS — I350 Nonrheumatic aortic (valve) stenosis: Secondary | ICD-10-CM

## 2013-06-12 DIAGNOSIS — E782 Mixed hyperlipidemia: Secondary | ICD-10-CM

## 2013-06-12 DIAGNOSIS — Z954 Presence of other heart-valve replacement: Secondary | ICD-10-CM

## 2013-06-12 DIAGNOSIS — I119 Hypertensive heart disease without heart failure: Secondary | ICD-10-CM | POA: Diagnosis not present

## 2013-06-12 DIAGNOSIS — Z952 Presence of prosthetic heart valve: Secondary | ICD-10-CM

## 2013-06-12 DIAGNOSIS — I359 Nonrheumatic aortic valve disorder, unspecified: Secondary | ICD-10-CM | POA: Diagnosis not present

## 2013-06-12 LAB — CBC WITH DIFFERENTIAL/PLATELET
BASOS PCT: 0.3 % (ref 0.0–3.0)
Basophils Absolute: 0 10*3/uL (ref 0.0–0.1)
Eosinophils Absolute: 0.1 10*3/uL (ref 0.0–0.7)
Eosinophils Relative: 1.8 % (ref 0.0–5.0)
HEMATOCRIT: 41.6 % (ref 39.0–52.0)
HEMOGLOBIN: 14 g/dL (ref 13.0–17.0)
LYMPHS PCT: 33.8 % (ref 12.0–46.0)
Lymphs Abs: 2.1 10*3/uL (ref 0.7–4.0)
MCHC: 33.6 g/dL (ref 30.0–36.0)
MCV: 98.3 fl (ref 78.0–100.0)
MONOS PCT: 9.8 % (ref 3.0–12.0)
Monocytes Absolute: 0.6 10*3/uL (ref 0.1–1.0)
NEUTROS ABS: 3.4 10*3/uL (ref 1.4–7.7)
Neutrophils Relative %: 54.3 % (ref 43.0–77.0)
Platelets: 163 10*3/uL (ref 150.0–400.0)
RBC: 4.24 Mil/uL (ref 4.22–5.81)
RDW: 14.1 % (ref 11.5–14.6)
WBC: 6.2 10*3/uL (ref 4.5–10.5)

## 2013-06-12 LAB — LIPID PANEL
CHOL/HDL RATIO: 4
CHOLESTEROL: 157 mg/dL (ref 0–200)
HDL: 41.6 mg/dL (ref >39.00)
LDL CALC: 94 mg/dL (ref 0–99)
Triglycerides: 105 mg/dL (ref 0.0–149.0)
VLDL: 21 mg/dL (ref 0.0–40.0)

## 2013-06-12 LAB — HEPATIC FUNCTION PANEL
ALBUMIN: 4.1 g/dL (ref 3.5–5.2)
ALT: 22 U/L (ref 0–53)
AST: 22 U/L (ref 0–37)
Alkaline Phosphatase: 36 U/L — ABNORMAL LOW (ref 39–117)
BILIRUBIN DIRECT: 0.1 mg/dL (ref 0.0–0.3)
TOTAL PROTEIN: 6.5 g/dL (ref 6.0–8.3)
Total Bilirubin: 1.2 mg/dL (ref 0.3–1.2)

## 2013-06-12 NOTE — Progress Notes (Signed)
Quick Note:  Please make copy of labs for patient visit. ______ 

## 2013-06-17 ENCOUNTER — Ambulatory Visit (INDEPENDENT_AMBULATORY_CARE_PROVIDER_SITE_OTHER): Payer: Medicare Other | Admitting: Cardiology

## 2013-06-17 ENCOUNTER — Encounter: Payer: Self-pay | Admitting: Cardiology

## 2013-06-17 VITALS — BP 130/88 | HR 64 | Ht 72.0 in | Wt 196.0 lb

## 2013-06-17 DIAGNOSIS — Z953 Presence of xenogenic heart valve: Secondary | ICD-10-CM

## 2013-06-17 DIAGNOSIS — R5381 Other malaise: Secondary | ICD-10-CM | POA: Diagnosis not present

## 2013-06-17 DIAGNOSIS — G4700 Insomnia, unspecified: Secondary | ICD-10-CM

## 2013-06-17 DIAGNOSIS — E785 Hyperlipidemia, unspecified: Secondary | ICD-10-CM

## 2013-06-17 DIAGNOSIS — R5383 Other fatigue: Secondary | ICD-10-CM | POA: Diagnosis not present

## 2013-06-17 DIAGNOSIS — Z954 Presence of other heart-valve replacement: Secondary | ICD-10-CM | POA: Diagnosis not present

## 2013-06-17 DIAGNOSIS — I119 Hypertensive heart disease without heart failure: Secondary | ICD-10-CM

## 2013-06-17 DIAGNOSIS — Z952 Presence of prosthetic heart valve: Secondary | ICD-10-CM

## 2013-06-17 NOTE — Patient Instructions (Addendum)
Will obtain labs today and call you with the results (bmet/tsh/fr t4)  Your physician recommends that you continue on your current medications as directed. Please refer to the Current Medication list given to you today.  Your physician wants you to follow-up in: 4 month ov/ekg  You will receive a reminder letter in the mail two months in advance. If you don't receive a letter, please call our office to schedule the follow-up appointment.

## 2013-06-17 NOTE — Assessment & Plan Note (Signed)
The patient finds that he has been about 9 hours sleep a night and still doesn't feel refreshed in the morning.  He does not have any history of sleep apnea or excessive snoring.

## 2013-06-17 NOTE — Assessment & Plan Note (Signed)
The patient is doing well hemodynamically.  No evidence of any problem with his prosthetic aortic valve.  He goes to the gym and works out.

## 2013-06-17 NOTE — Progress Notes (Signed)
[name] [name] Date of Birth:  May 17, 1945 [address] [place],  [place]  [address] [phone]         Fax   [phone]  History of Present Illness: This pleasant 68 year old gentleman is seen for a post hospital office visit. He had a past history of aortic valve stenosis. He had  cardiac catheterization showing markedly dilated aortic root and proximal ascending aorta as well as moderate aortic stenosis. He underwent a Bentall procedure with a composite 28 mm Gelweave Valsalva graft and a 25 mm pericardial tissue valve on 07/29/12 by Dr. Cyndia Bent.  On 08/02/12 the patient underwent emergency mediastinal exploration for a repair of bleeding from the aortic annulus. He was discharged home on 08/08/12. Since discharge home he has been feeling reasonably well. He has finished the cardiac rehabilitation program..  He also had subsequent thyroid surgery and a nodule was removed which turned out to be benign.  Since last visit the patient has complained of decreased energy and requiring more sleep.  He also has felt colder this winter.  We are going to check thyroid function.  He has not been having a chills or fever or night sweats.  He has had a recurring boil on the right side of his chest wall which he is treating.   Current Outpatient Prescriptions  Medication Sig Dispense Refill  . ALPRAZolam (XANAX) 1 MG tablet Take 1 tablet (1 mg total) by mouth daily as needed for sleep.  90 tablet  1  . amoxicillin (AMOXIL) 500 MG capsule Take 4 tablets 30-60 minutes prior to dental cleaning  4 capsule  1  . aspirin EC 81 MG tablet Take 1 tablet (81 mg total) by mouth daily.      [name] buPROPion (WELLBUTRIN) 100 MG tablet Take 1 tablet (100 mg total) by mouth 2 (two) times daily.  180 tablet  1  . calcium carbonate (OS-CAL) 600 MG TABS Take 600 mg by mouth daily.        [name] CINNAMON PO Take 2,000 mg by mouth daily.        . Cyanocobalamin (VITAMIN B-12 PO) Take 1 tablet by mouth daily.       .  Ferrous Sulfate (IRON) 325 (65 FE) MG TABS Take 1 tablet by mouth daily.       . Garlic 4128 MG CAPS Take 4,000 mg by mouth daily.       . Glucosamine HCl-Glucosamin SO4 (GLUCOSAMINE COMPLEX PO) Take 4,000 mg by mouth daily.       [name] MAGNESIUM PO Take 1 tablet by mouth daily.       . metoprolol tartrate (LOPRESSOR) 25 MG tablet Take 0.5 tablets (12.5 mg total) by mouth 2 (two) times daily.  90 tablet  1  . OVER THE COUNTER MEDICATION Take 1 tablet by mouth daily. Tumeric tablet      . polyethylene glycol (MIRALAX / GLYCOLAX) packet Take 17 g by mouth daily as needed (for constipation).      . Red Yeast Rice Extract (RED YEAST RICE PO) Take 2 tablets by mouth daily.       . Saw Palmetto, Serenoa repens, (SAW PALMETTO PO) Take 1 capsule by mouth daily.       . simvastatin (ZOCOR) 20 MG tablet Take 1 tablet (20 mg total) by mouth at bedtime.  90 tablet  1  . temazepam (RESTORIL) 15 MG capsule Take 1 capsule (15 mg total) by mouth at bedtime as needed for sleep.  30 capsule  1   No current facility-administered medications for this visit.    No Known Allergies  Patient Active Problem List   Diagnosis Date Noted  . Visual disturbance of one eye 02/17/2013  . Insomnia 11/11/2012  . S/P aortic valve replacement with bioprosthetic valve 08/15/2012  . Iron deficiency anemia 08/15/2012  . Thoracic aortic aneurysm without mention of rupture 07/05/2012  . Aortic stenosis 01/12/2011  . Benign hypertensive heart disease without heart failure 01/12/2011  . Dyslipidemia 01/12/2011  . BPH with obstruction/lower urinary tract symptoms 01/12/2011    History  Smoking status  . Former Smoker -- 2.00 packs/day for 8 years  . Types: Cigarettes  . Quit date: 01/11/1969  Smokeless tobacco  . Never Used    History  Alcohol Use  . 2.4 oz/week  . 4 Shots of liquor per week    Family History  Problem Relation Age of Onset  . Cancer Father     colon  . Heart disease Mother   . Multiple myeloma  Brother   . Cancer Brother     multiple myloma    Review of Systems: Constitutional: no fever chills diaphoresis or fatigue or change in weight.  Head and neck: no hearing loss, no epistaxis, no photophobia or visual disturbance. Respiratory: No cough, shortness of breath or wheezing. Cardiovascular: No chest pain peripheral edema, palpitations. Gastrointestinal: No abdominal distention, no abdominal pain, no change in bowel habits hematochezia or melena. Genitourinary: No dysuria, no frequency, no urgency, no nocturia. Musculoskeletal:No arthralgias, no back pain, no gait disturbance or myalgias. Neurological: No dizziness, no headaches, no numbness, no seizures, no syncope, no weakness, no tremors. Hematologic: No lymphadenopathy, no easy bruising. Psychiatric: No confusion, no hallucinations, no sleep disturbance.    Physical Exam: Filed Vitals:   06/17/13 1526  BP: 130/88  Pulse: 64   the general appearance reveals a well-developed well-nourished gentleman in no distress.The head and neck exam reveals pupils equal and reactive.  Extraocular movements are full.  There is no scleral icterus.  The mouth and pharynx are normal.  The neck is supple.  The carotids reveal no bruits.  The jugular venous pressure is normal.  The  thyroid is not enlarged.  There is no lymphadenopathy.  The chest is clear to percussion and auscultation.  There are no rales or rhonchi.  Expansion of the chest is symmetrical.  The precordium is quiet.  The first heart sound is normal.  The second heart sound is physiologically split.  There is a grade 2/6 systolic ejection murmur across the prosthetic aortic valve.  No diastolic murmur.  Valve clicks are of good quality. There is no abnormal lift or heave.  The abdomen is soft and nontender.  The bowel sounds are normal.  The liver and spleen are not enlarged.  There are no abdominal masses.  There are no abdominal bruits.  Extremities reveal good pedal pulses.  There  is no phlebitis or edema.  There is no cyanosis or clubbing.  Strength is normal and symmetrical in all extremities.  There is no lateralizing weakness.  There are no sensory deficits.  The skin is warm and dry.  There is no rash.  There is a small boil along the right lateral chest wall which is draining a small amount of purulent material.     Assessment / Plan: We reviewed his recent labs from last week which are excellent.  He is no longer anemic.  Lipids are satisfactory.  We did  not check thyroid function so we will check that today.  If no cause for his malaise is found he might benefit from a mild antidepressant. Recheck in 4 months for followup office visit and EKG

## 2013-06-17 NOTE — Assessment & Plan Note (Signed)
The patient is not having any racing of his heart or palpitations.  No dizziness or syncope or headaches.

## 2013-06-18 LAB — BASIC METABOLIC PANEL
BUN: 19 mg/dL (ref 6–23)
CHLORIDE: 102 meq/L (ref 96–112)
CO2: 30 mEq/L (ref 19–32)
Calcium: 9.2 mg/dL (ref 8.4–10.5)
Creatinine, Ser: 1.3 mg/dL (ref 0.4–1.5)
GFR: 59.07 mL/min — ABNORMAL LOW (ref >60.00)
GLUCOSE: 97 mg/dL (ref 70–99)
POTASSIUM: 4.6 meq/L (ref 3.5–5.1)
Sodium: 138 mEq/L (ref 135–145)

## 2013-06-18 LAB — TSH: TSH: 1.69 u[IU]/mL (ref 0.35–5.50)

## 2013-06-18 LAB — T4, FREE: Free T4: 0.66 ng/dL (ref 0.60–1.60)

## 2013-06-18 NOTE — Progress Notes (Signed)
Quick Note:  Please report to patient. The recent labs are stable. Continue same medication and careful diet. Thyroid is normal. No cause for malaise found. Continue same meds and increase regular exercise. ______

## 2013-06-19 ENCOUNTER — Telehealth: Payer: Self-pay | Admitting: *Deleted

## 2013-06-19 NOTE — Telephone Encounter (Signed)
Advised patient of lab results. Encouraged to make changes as he and  Dr. Mare Ferrari discussed at office visit, if no improvement call back

## 2013-06-19 NOTE — Telephone Encounter (Signed)
Message copied by Earvin Hansen on Thu Jun 19, 2013  9:53 AM ------      Message from: Darlin Coco      Created: Wed Jun 18, 2013  7:44 PM       Please report to patient.  The recent labs are stable. Continue same medication and careful diet. Thyroid is normal.  No cause for malaise found. Continue same meds and increase regular exercise. ------

## 2013-08-22 ENCOUNTER — Other Ambulatory Visit: Payer: Self-pay | Admitting: *Deleted

## 2013-08-22 DIAGNOSIS — F419 Anxiety disorder, unspecified: Secondary | ICD-10-CM

## 2013-08-25 MED ORDER — ALPRAZOLAM 1 MG PO TABS: 1.0000 mg | ORAL_TABLET | Freq: Every day | ORAL | Status: DC | PRN

## 2013-09-15 ENCOUNTER — Telehealth: Payer: Self-pay | Admitting: Cardiology

## 2013-09-15 MED ORDER — METOPROLOL SUCCINATE ER 25 MG PO TB24: ORAL_TABLET | ORAL | Status: DC

## 2013-09-15 NOTE — Telephone Encounter (Signed)
Pt advised,verbalized understanding. 

## 2013-09-15 NOTE — Telephone Encounter (Signed)
New Message:  Pt is c/o dizziness, fatigue/tired, and depression... States he believes it is due to  [employer]. Pt is requesting a call back from [place]. Pt wants to be worked in to see Dr. Mare Ferrari asap

## 2013-09-15 NOTE — Telephone Encounter (Signed)
Pt thinks he is having a reaction to metoprolol. He takes 1/2 of a 25mg  tablet two times a day. Pt has been taking metoprolol over a year.   Pt states BP 141/85 and heart rate 67 today.   He is asking if metoprolol may be contributing to his decreased energy level, blurred vision, and worsening depression. He feels his symptoms have exacerbated in the last 2 weeks. He is emotionally spent right now.   I will forward to Dr Mare Ferrari for review.

## 2013-09-15 NOTE — Telephone Encounter (Signed)
Let us switch him to Toprol 25 mg tablet and just take 1/2 tablet once a day.

## 2013-09-30 ENCOUNTER — Telehealth (INDEPENDENT_AMBULATORY_CARE_PROVIDER_SITE_OTHER): Payer: Self-pay | Admitting: General Surgery

## 2013-09-30 NOTE — Telephone Encounter (Signed)
Patient called in explaining that he has a gland that is swollen on the side of his neck, he explained that it has popped up recently and it comes and goes.  It has started to become painful and difficult to swallow due to this.  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  Informed him that it does not sound related to the thyroid.  Informed him that he should call his PCP.  The patient verbalized understanding and agreed with the POC

## 2013-10-15 DIAGNOSIS — H16229 Keratoconjunctivitis sicca, not specified as Sjogren's, unspecified eye: Secondary | ICD-10-CM | POA: Diagnosis not present

## 2013-10-16 ENCOUNTER — Other Ambulatory Visit: Payer: Self-pay

## 2013-10-16 MED ORDER — SIMVASTATIN 20 MG PO TABS: 20.0000 mg | ORAL_TABLET | Freq: Every day | ORAL | Status: DC

## 2013-11-03 DIAGNOSIS — H02839 Dermatochalasis of unspecified eye, unspecified eyelid: Secondary | ICD-10-CM | POA: Diagnosis not present

## 2013-11-03 DIAGNOSIS — H04129 Dry eye syndrome of unspecified lacrimal gland: Secondary | ICD-10-CM | POA: Diagnosis not present

## 2013-11-10 ENCOUNTER — Other Ambulatory Visit: Payer: Medicare Other

## 2013-11-12 ENCOUNTER — Other Ambulatory Visit (INDEPENDENT_AMBULATORY_CARE_PROVIDER_SITE_OTHER): Payer: Medicare Other

## 2013-11-12 ENCOUNTER — Other Ambulatory Visit: Payer: Self-pay | Admitting: *Deleted

## 2013-11-12 DIAGNOSIS — E785 Hyperlipidemia, unspecified: Secondary | ICD-10-CM

## 2013-11-12 DIAGNOSIS — R5383 Other fatigue: Secondary | ICD-10-CM | POA: Diagnosis not present

## 2013-11-12 DIAGNOSIS — R5381 Other malaise: Secondary | ICD-10-CM

## 2013-11-12 LAB — LIPID PANEL
CHOL/HDL RATIO: 4
Cholesterol: 167 mg/dL (ref 0–200)
HDL: 44.6 mg/dL (ref >39.00)
LDL Cholesterol: 106 mg/dL — ABNORMAL HIGH (ref 0–99)
NONHDL: 122.4
Triglycerides: 82 mg/dL (ref 0.0–149.0)
VLDL: 16.4 mg/dL (ref 0.0–40.0)

## 2013-11-12 LAB — HEPATIC FUNCTION PANEL
ALT: 20 U/L (ref 0–53)
AST: 20 U/L (ref 0–37)
Albumin: 4.1 g/dL (ref 3.5–5.2)
Alkaline Phosphatase: 39 U/L (ref 39–117)
BILIRUBIN TOTAL: 0.9 mg/dL (ref 0.2–1.2)
Bilirubin, Direct: 0 mg/dL (ref 0.0–0.3)
Total Protein: 6.7 g/dL (ref 6.0–8.3)

## 2013-11-12 LAB — BASIC METABOLIC PANEL
BUN: 17 mg/dL (ref 6–23)
CO2: 27 meq/L (ref 19–32)
CREATININE: 1.1 mg/dL (ref 0.4–1.5)
Calcium: 9 mg/dL (ref 8.4–10.5)
Chloride: 105 mEq/L (ref 96–112)
GFR: 72.56 mL/min (ref >60.00)
Glucose, Bld: 87 mg/dL (ref 70–99)
Potassium: 4.2 mEq/L (ref 3.5–5.1)
Sodium: 141 mEq/L (ref 135–145)

## 2013-11-12 LAB — CBC WITH DIFFERENTIAL/PLATELET
BASOS PCT: 0.3 % (ref 0.0–3.0)
Basophils Absolute: 0 10*3/uL (ref 0.0–0.1)
EOS ABS: 0.2 10*3/uL (ref 0.0–0.7)
Eosinophils Relative: 2.3 % (ref 0.0–5.0)
HCT: 42.2 % (ref 39.0–52.0)
Hemoglobin: 14.6 g/dL (ref 13.0–17.0)
Lymphocytes Relative: 30.9 % (ref 12.0–46.0)
Lymphs Abs: 2.2 10*3/uL (ref 0.7–4.0)
MCHC: 34.5 g/dL (ref 30.0–36.0)
MCV: 98 fl (ref 78.0–100.0)
MONO ABS: 0.6 10*3/uL (ref 0.1–1.0)
Monocytes Relative: 8.9 % (ref 3.0–12.0)
NEUTROS PCT: 57.6 % (ref 43.0–77.0)
Neutro Abs: 4.1 10*3/uL (ref 1.4–7.7)
PLATELETS: 177 10*3/uL (ref 150.0–400.0)
RBC: 4.31 Mil/uL (ref 4.22–5.81)
RDW: 14.6 % (ref 11.5–15.5)
WBC: 7.1 10*3/uL (ref 4.0–10.5)

## 2013-11-12 LAB — T4, FREE: Free T4: 0.78 ng/dL (ref 0.60–1.60)

## 2013-11-12 LAB — TSH: TSH: 0.51 u[IU]/mL (ref 0.35–4.50)

## 2013-11-17 ENCOUNTER — Ambulatory Visit (INDEPENDENT_AMBULATORY_CARE_PROVIDER_SITE_OTHER): Payer: Medicare Other | Admitting: Cardiology

## 2013-11-17 ENCOUNTER — Encounter: Payer: Self-pay | Admitting: Cardiology

## 2013-11-17 VITALS — BP 134/82 | HR 69 | Ht 72.0 in | Wt 197.0 lb

## 2013-11-17 DIAGNOSIS — Z954 Presence of other heart-valve replacement: Secondary | ICD-10-CM

## 2013-11-17 DIAGNOSIS — E785 Hyperlipidemia, unspecified: Secondary | ICD-10-CM

## 2013-11-17 DIAGNOSIS — E782 Mixed hyperlipidemia: Secondary | ICD-10-CM | POA: Diagnosis not present

## 2013-11-17 DIAGNOSIS — D509 Iron deficiency anemia, unspecified: Secondary | ICD-10-CM

## 2013-11-17 DIAGNOSIS — Z952 Presence of prosthetic heart valve: Secondary | ICD-10-CM

## 2013-11-17 DIAGNOSIS — I119 Hypertensive heart disease without heart failure: Secondary | ICD-10-CM | POA: Diagnosis not present

## 2013-11-17 DIAGNOSIS — Z953 Presence of xenogenic heart valve: Secondary | ICD-10-CM

## 2013-11-17 NOTE — Assessment & Plan Note (Signed)
The patient has not been experiencing any chest pain or shortness of breath. 

## 2013-11-17 NOTE — Assessment & Plan Note (Signed)
The patient is not having any headaches or dizzy spells or other symptoms of high blood pressure.

## 2013-11-17 NOTE — Progress Notes (Signed)
[name] [name] Date of Birth:  1945-12-09 [facility] [address] [place], [place]  [address] [phone]        Fax   [phone]   History of Present Illness: This pleasant 68 year old gentleman is seen for a scheduled followup office visit. He had a past history of aortic valve stenosis. He had cardiac catheterization showing markedly dilated aortic root and proximal ascending aorta as well as moderate aortic stenosis. He underwent a Bentall procedure with a composite 28 mm Gelweave Valsalva graft and a 25 mm pericardial tissue valve on 07/29/12 by Dr. Cyndia Bent. On 08/02/12 the patient underwent emergency mediastinal exploration for a repair of bleeding from the aortic annulus. He was discharged home on 08/08/12. Since discharge home he has been feeling reasonably well. He has finished the cardiac rehabilitation program.. He also had subsequent thyroid surgery and a nodule was removed which turned out to be benign.  Since last visit he has been feeling well.  He does complain of lack of energy.  Current Outpatient Prescriptions  Medication Sig Dispense Refill  . ALPRAZolam (XANAX) 1 MG tablet Take 1 tablet (1 mg total) by mouth daily as needed.  90 tablet  1  . amoxicillin (AMOXIL) 500 MG capsule Take 4 tablets 30-60 minutes prior to dental cleaning  4 capsule  1  . aspirin EC 81 MG tablet Take 1 tablet (81 mg total) by mouth daily.      [name] buPROPion (WELLBUTRIN) 100 MG tablet Take 1 tablet (100 mg total) by mouth 2 (two) times daily.  180 tablet  1  . calcium carbonate (OS-CAL) 600 MG TABS Take 600 mg by mouth daily.        [name] CINNAMON PO Take 2,000 mg by mouth daily.        . Cyanocobalamin (VITAMIN B-12 PO) Take 1 tablet by mouth daily.       . Ferrous Sulfate (IRON) 325 (65 FE) MG TABS Take 1 tablet by mouth daily.       . Garlic 5188 MG CAPS Take 4,000 mg by mouth daily.       . Glucosamine HCl-Glucosamin SO4 (GLUCOSAMINE COMPLEX PO) Take 4,000 mg by mouth daily.        [name] MAGNESIUM PO Take 1 tablet by mouth daily.       . metoprolol succinate (TOPROL XL) 25 MG 24 hr tablet 1/2 tablet (12.57m) daily  15 tablet  4  . OVER THE COUNTER MEDICATION Take 1 tablet by mouth daily. Tumeric tablet      . polyethylene glycol (MIRALAX / GLYCOLAX) packet Take 17 g by mouth daily as needed (for constipation).      . Red Yeast Rice Extract (RED YEAST RICE PO) Take 2 tablets by mouth daily.       . Saw Palmetto, Serenoa repens, (SAW PALMETTO PO) Take 1 capsule by mouth daily.       . simvastatin (ZOCOR) 20 MG tablet Take 1 tablet (20 mg total) by mouth at bedtime.  30 tablet  0  . temazepam (RESTORIL) 15 MG capsule Take 1 capsule (15 mg total) by mouth at bedtime as needed for sleep.  30 capsule  1   No current facility-administered medications for this visit.    No Known Allergies  Patient Active Problem List   Diagnosis Date Noted  . Visual disturbance of one eye 02/17/2013  . Insomnia 11/11/2012  . S/P aortic valve replacement with bioprosthetic valve 08/15/2012  .  Iron deficiency anemia 08/15/2012  . Thoracic aortic aneurysm without mention of rupture 07/05/2012  . Aortic stenosis 01/12/2011  . Benign hypertensive heart disease without heart failure 01/12/2011  . Dyslipidemia 01/12/2011  . BPH with obstruction/lower urinary tract symptoms 01/12/2011    History  Smoking status  . Former Smoker -- 2.00 packs/day for 8 years  . Types: Cigarettes  . Quit date: 01/11/1969  Smokeless tobacco  . Never Used    History  Alcohol Use  . 2.4 oz/week  . 4 Shots of liquor per week    Family History  Problem Relation Age of Onset  . Cancer Father     colon  . Heart disease Mother   . Multiple myeloma Brother   . Cancer Brother     multiple myloma    Review of Systems: Constitutional: no fever chills diaphoresis or fatigue or change in weight.  Head and neck: no hearing loss, no epistaxis, no photophobia or visual disturbance. Respiratory: No cough,  shortness of breath or wheezing. Cardiovascular: No chest pain peripheral edema, palpitations. Gastrointestinal: No abdominal distention, no abdominal pain, no change in bowel habits hematochezia or melena. Genitourinary: No dysuria, no frequency, no urgency, no nocturia. Musculoskeletal:No arthralgias, no back pain, no gait disturbance or myalgias. Neurological: No dizziness, no headaches, no numbness, no seizures, no syncope, no weakness, no tremors. Hematologic: No lymphadenopathy, no easy bruising. Psychiatric: No confusion, no hallucinations, no sleep disturbance.    Physical Exam: Filed Vitals:   11/17/13 1402  BP: 134/82  Pulse: 69   the general appearance reveals a well-developed well-nourished gentleman in no distress.The head and neck exam reveals pupils equal and reactive.  Extraocular movements are full.  There is no scleral icterus.  The mouth and pharynx are normal.  The neck is supple.  The carotids reveal no bruits.  The jugular venous pressure is normal.  The  thyroid is not enlarged.  There is no lymphadenopathy.  The chest is clear to percussion and auscultation.  There are no rales or rhonchi.  Expansion of the chest is symmetrical.  The precordium is quiet.  The first heart sound is normal.  The second heart sound is physiologically split.  There is grade 2/6 systolic flow murmur across the prosthetic aortic valve.  No diastolic murmur. There is no abnormal lift or heave.  The abdomen is soft and nontender.  The bowel sounds are normal.  The liver and spleen are not enlarged.  There are no abdominal masses.  There are no abdominal bruits.  Extremities reveal good pedal pulses.  There is no phlebitis or edema.  There is no cyanosis or clubbing.  Strength is normal and symmetrical in all extremities.  There is no lateralizing weakness.  There are no sensory deficits.  The skin is warm and dry.  There is no rash.     Assessment / Plan: 1.  Status post Bentall procedure on  07/29/12 for moderate aortic stenosis and markedly dilated aortic root and proximal ascending aorta.  He has a bioprosthetic aortic valve. 2. Dyslipidemia 3. essential hypertension  Disposition continue on same medication.  He will be taking a 50th wedding anniversary trip to [place] in October.  Recheck here in 6 months for followup office visit and fasting lab work and CBC.

## 2013-11-17 NOTE — Assessment & Plan Note (Signed)
The patient has a past history of iron deficiency.

## 2013-11-17 NOTE — Patient Instructions (Signed)
**Note De-Identified Kemi Gell Obfuscation** Your physician recommends that you continue on your current medications as directed. Please refer to the Current Medication list given to you today.  Your physician recommends that you return for lab work in: 6 months, at next office visit. Do not eat or drink after midnight the night before labs are drawn.  Your physician wants you to follow-up in: 6 months. You will receive a reminder letter in the mail two months in advance. If you don't receive a letter, please call our office to schedule the follow-up appointment.

## 2013-11-17 NOTE — Assessment & Plan Note (Signed)
The patient has a history of dyslipidemia and is on simvastatin 20 mg daily.  Lab work is satisfactory.  He is not having any myalgias.

## 2013-12-01 DIAGNOSIS — H02839 Dermatochalasis of unspecified eye, unspecified eyelid: Secondary | ICD-10-CM | POA: Diagnosis not present

## 2013-12-11 ENCOUNTER — Other Ambulatory Visit: Payer: Self-pay | Admitting: *Deleted

## 2013-12-11 MED ORDER — METOPROLOL SUCCINATE ER 25 MG PO TB24: ORAL_TABLET | ORAL | Status: DC

## 2013-12-19 DIAGNOSIS — IMO0001 Reserved for inherently not codable concepts without codable children: Secondary | ICD-10-CM | POA: Diagnosis not present

## 2014-01-13 NOTE — Telephone Encounter (Signed)
error 

## 2014-01-22 ENCOUNTER — Other Ambulatory Visit: Payer: Self-pay

## 2014-01-22 ENCOUNTER — Telehealth: Payer: Self-pay

## 2014-01-22 DIAGNOSIS — F329 Major depressive disorder, single episode, unspecified: Secondary | ICD-10-CM

## 2014-01-22 DIAGNOSIS — F32A Depression, unspecified: Secondary | ICD-10-CM

## 2014-01-22 MED ORDER — SIMVASTATIN 20 MG PO TABS: 20.0000 mg | ORAL_TABLET | Freq: Every day | ORAL | Status: DC

## 2014-01-22 MED ORDER — METOPROLOL SUCCINATE ER 25 MG PO TB24: ORAL_TABLET | ORAL | Status: DC

## 2014-01-22 MED ORDER — BUPROPION HCL 100 MG PO TABS: 100.0000 mg | ORAL_TABLET | Freq: Two times a day (BID) | ORAL | Status: DC

## 2014-01-22 NOTE — Telephone Encounter (Signed)
Okay to refill bupropion

## 2014-01-22 NOTE — Telephone Encounter (Signed)
Refill sent to pharmacy by Kelly Splinter CMA

## 2014-01-30 ENCOUNTER — Other Ambulatory Visit: Payer: Self-pay | Admitting: *Deleted

## 2014-02-02 IMAGING — CR DG CHEST 1V PORT
1 series · 1 of 1 positions shown · non-contrast
Comparison: Radiograph of same date.

CLINICAL DATA: Status post thoracic surgery

PORTABLE CHEST - 1 VIEW

[AP]
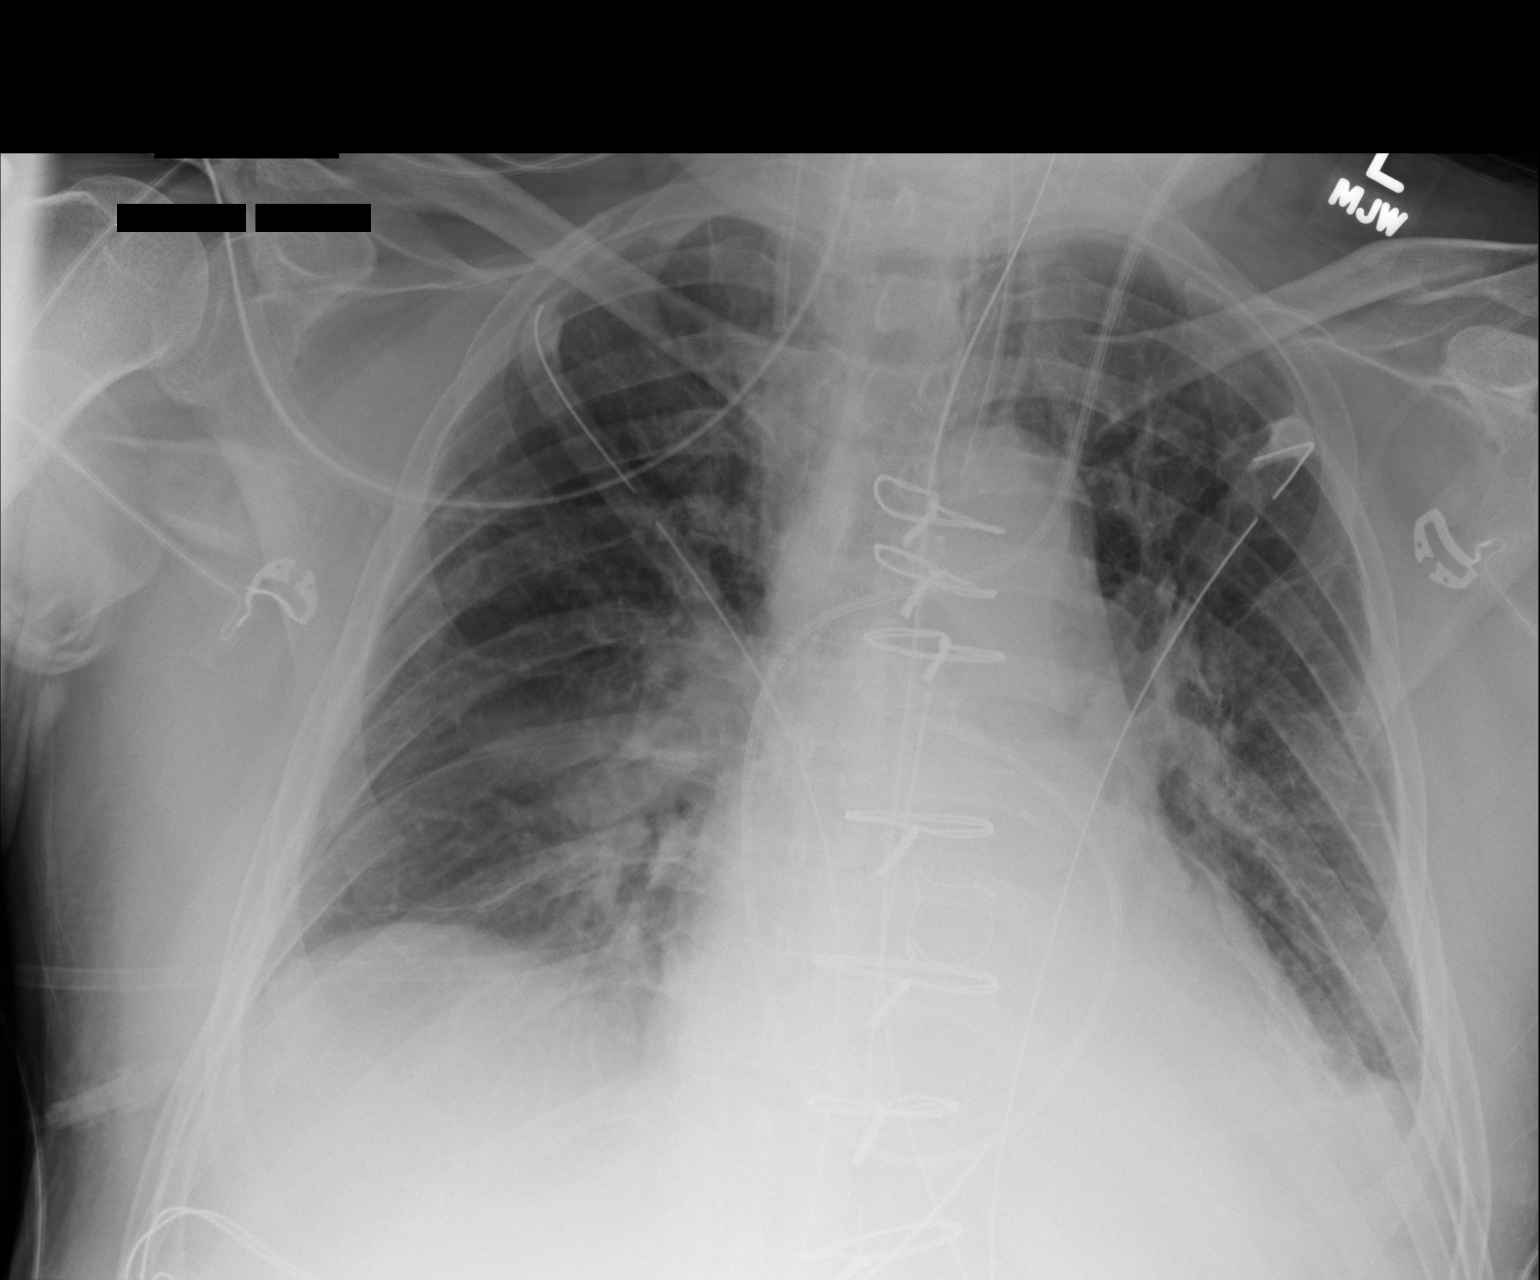

[1 of 1 positions shown; findings below may reference images not displayed]

FINDINGS: Stable mild cardiomegaly.  Bilateral chest tubes are
noted without pneumothorax.  Endotracheal tube and nasogastric tube
are unchanged in position.  Swan-Ganz catheter is again noted with
tip directed toward right pulmonary artery.  Minimal left basilar
opacity is noted and unchanged.
IMPRESSION: No change in minimal left basilar opacity.  Bilateral chest tubes
are again noted without pneumothorax.

## 2014-02-02 IMAGING — CR DG CHEST 1V PORT
1 series · 1 of 1 positions shown · non-contrast
Comparison: 07/24/2012

CLINICAL DATA: Post operative re-exploration of the chest for
bleeding.

PORTABLE CHEST - 1 VIEW

[AP]
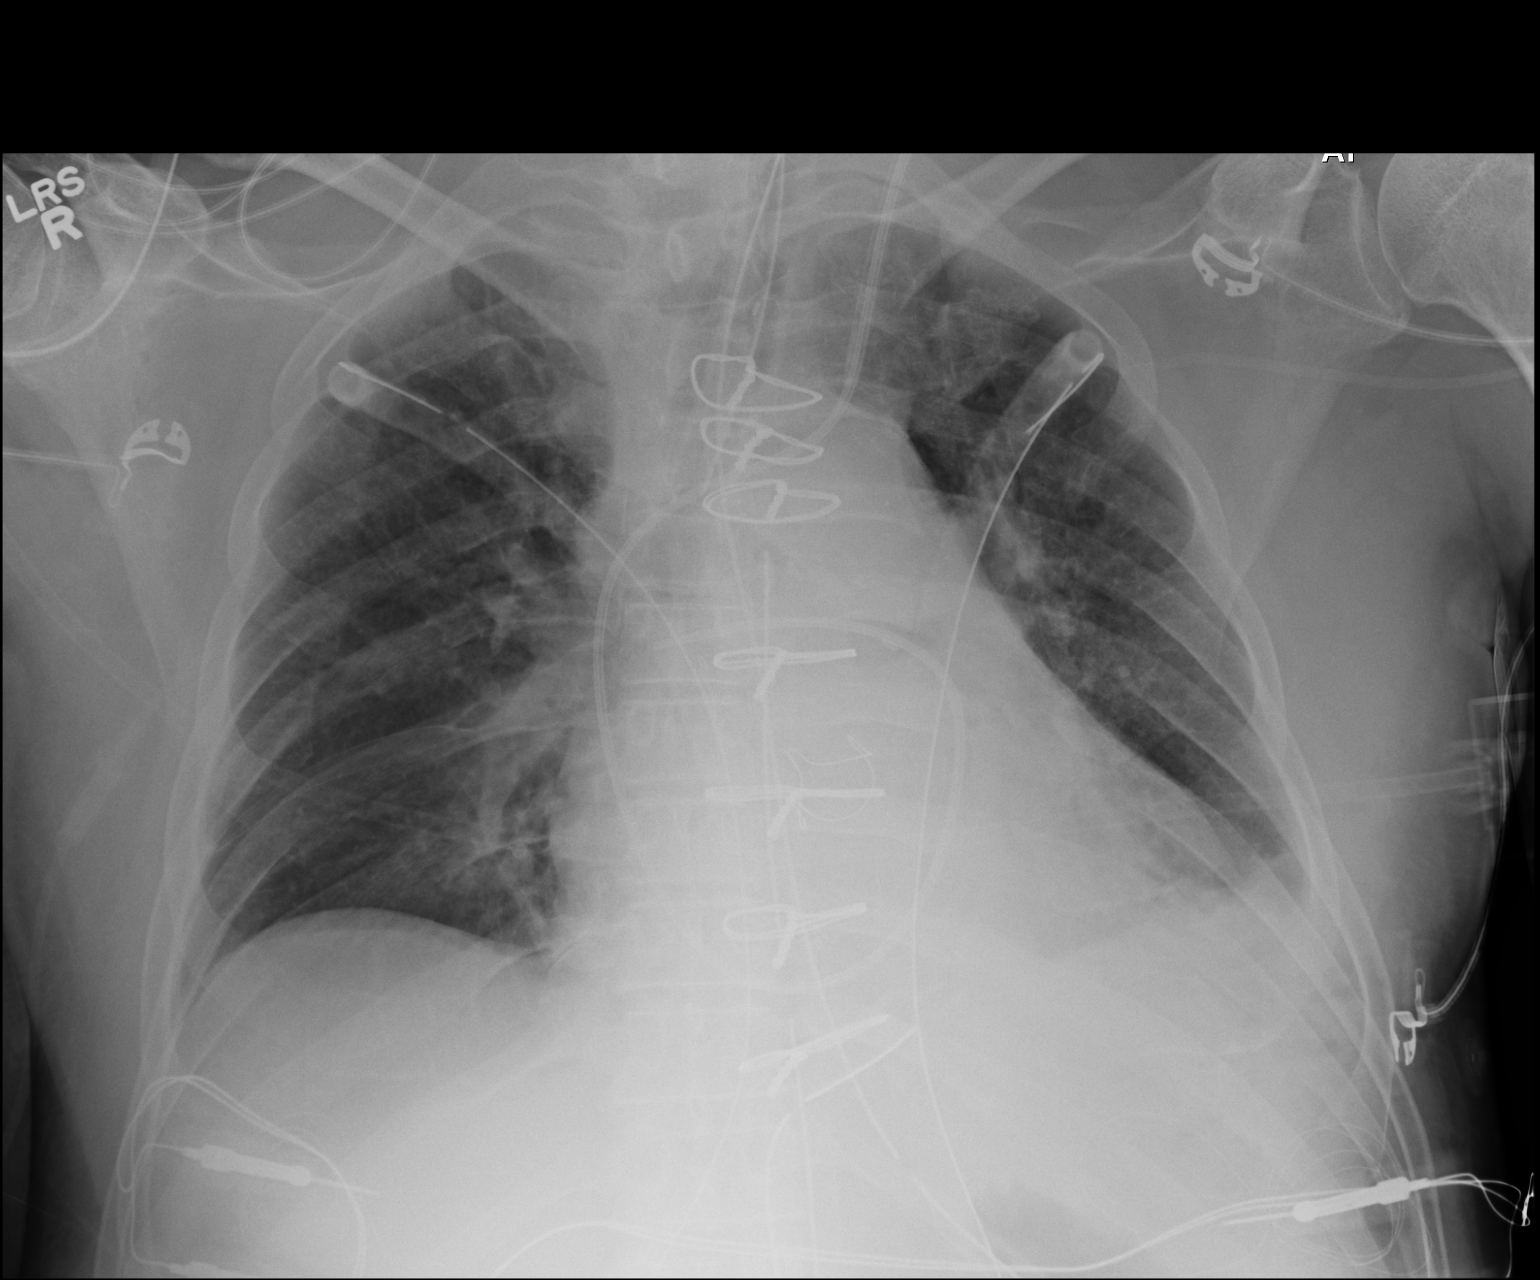

[1 of 1 positions shown; findings below may reference images not displayed]

FINDINGS: Postoperative changes in the mediastinum with sternotomy
and aortic valve prosthesis.  Endotracheal tube with tip about
cm above the carina.  Enteric tube tip is out of the field of view
but below the left hemidiaphragm.  Two mediastinal drains are
present.  Bilateral chest tubes are present.  Left Swan-Ganz
catheter with tip over the right pulmonary artery.  Cardiac
enlargement with mild pulmonary vascular congestion.  Infiltration
or atelectasis in the left lung base.  No pneumothorax.
IMPRESSION: Appliances appear to be in satisfactory location.  Cardiac
enlargement with mild pulmonary vascular congestion.  Infiltration
or atelectasis in the left lung base.

## 2014-02-04 IMAGING — CR DG CHEST 1V PORT
1 series · 1 of 1 positions shown · non-contrast
Comparison: 08/03/2012

CLINICAL DATA: Extubated, postoperative exam

PORTABLE CHEST - 1 VIEW

[AP]
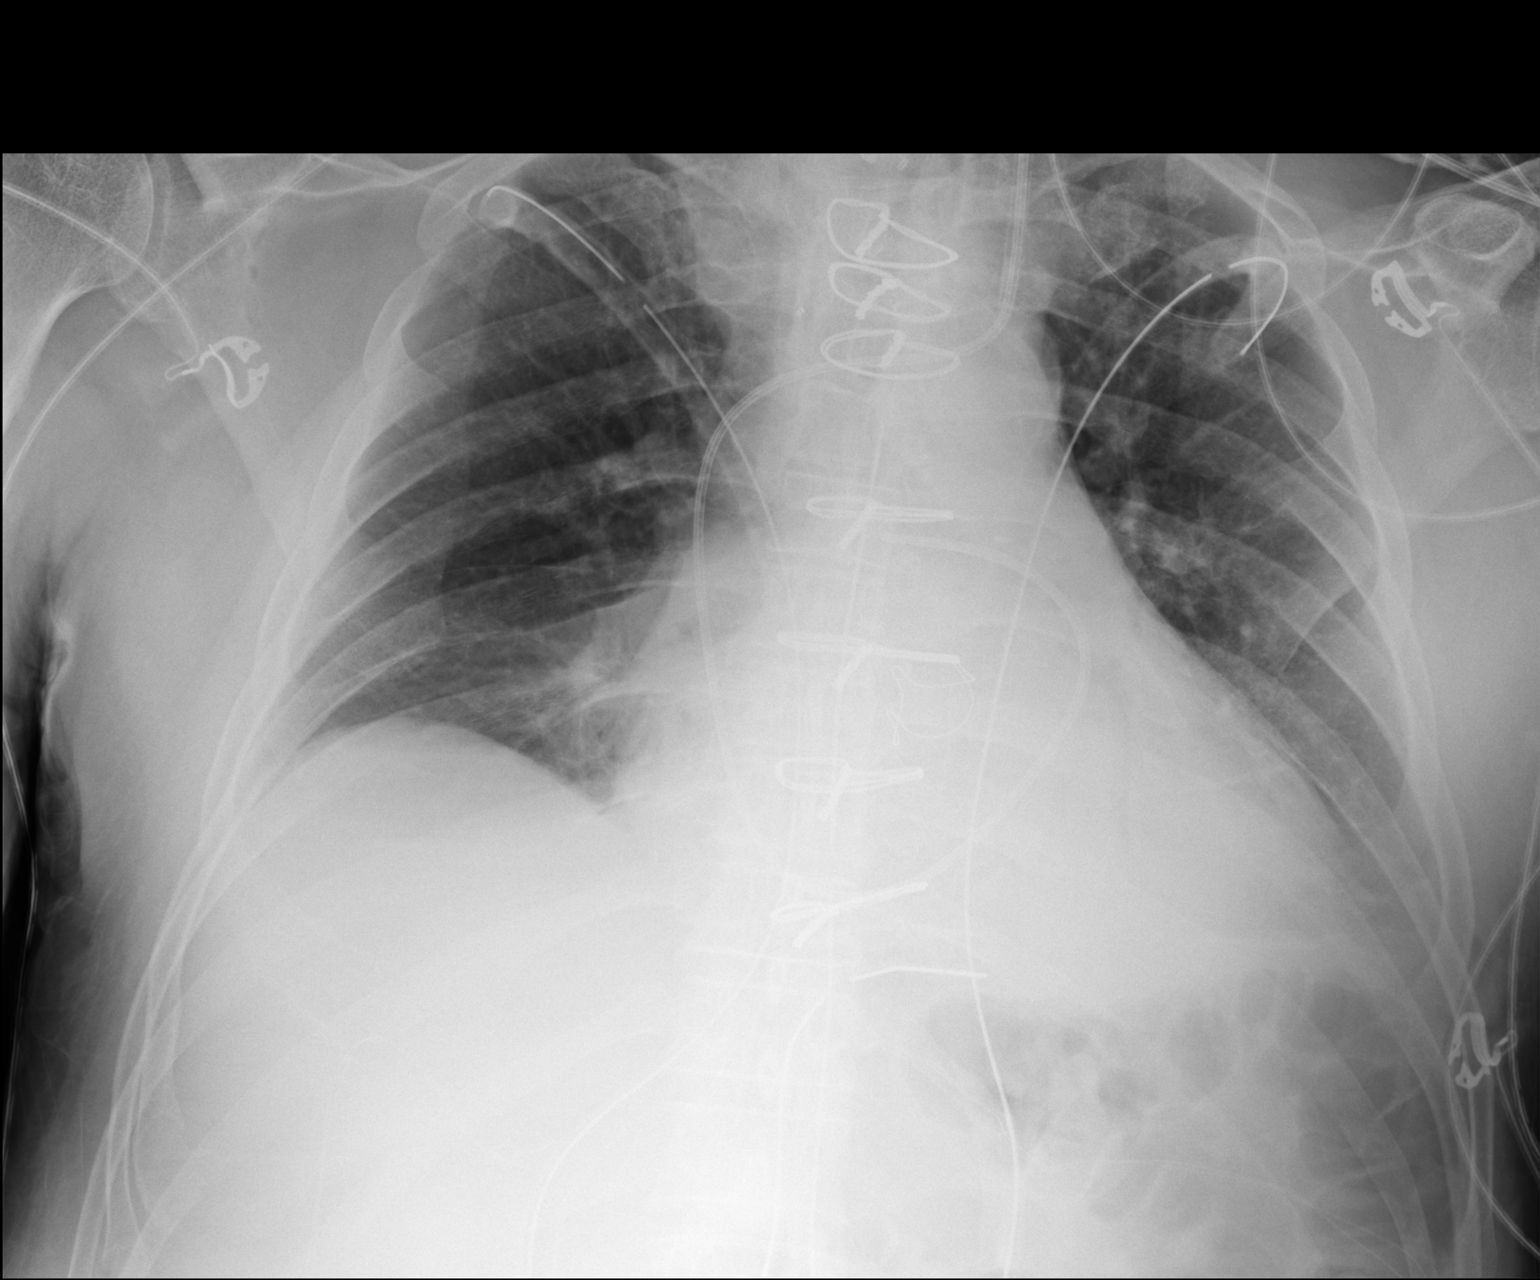

[1 of 1 positions shown; findings below may reference images not displayed]

FINDINGS: Endotracheal tube and NG tube have both been removed.
Swan-Ganz catheter is within the proximal right pulmonary artery.
Mediastinal drain and bilateral chest tubes remain.  The patient
there is status post aortic valve replacement.  Stable cardiomegaly
with slight vascular congestion and persistent basilar atelectasis,
worse on the left.  Small effusions not excluded.  No enlarging or
significant pneumothorax.  Overall stable exam.
IMPRESSION: Extubated.  Stable postoperative chest exam.

## 2014-02-07 IMAGING — CR DG CHEST 2V
2 series · 2 of 2 positions shown · non-contrast
Comparison: 08/05/2012

CLINICAL DATA: Postop.

CHEST - 2 VIEW

[w chest pa]
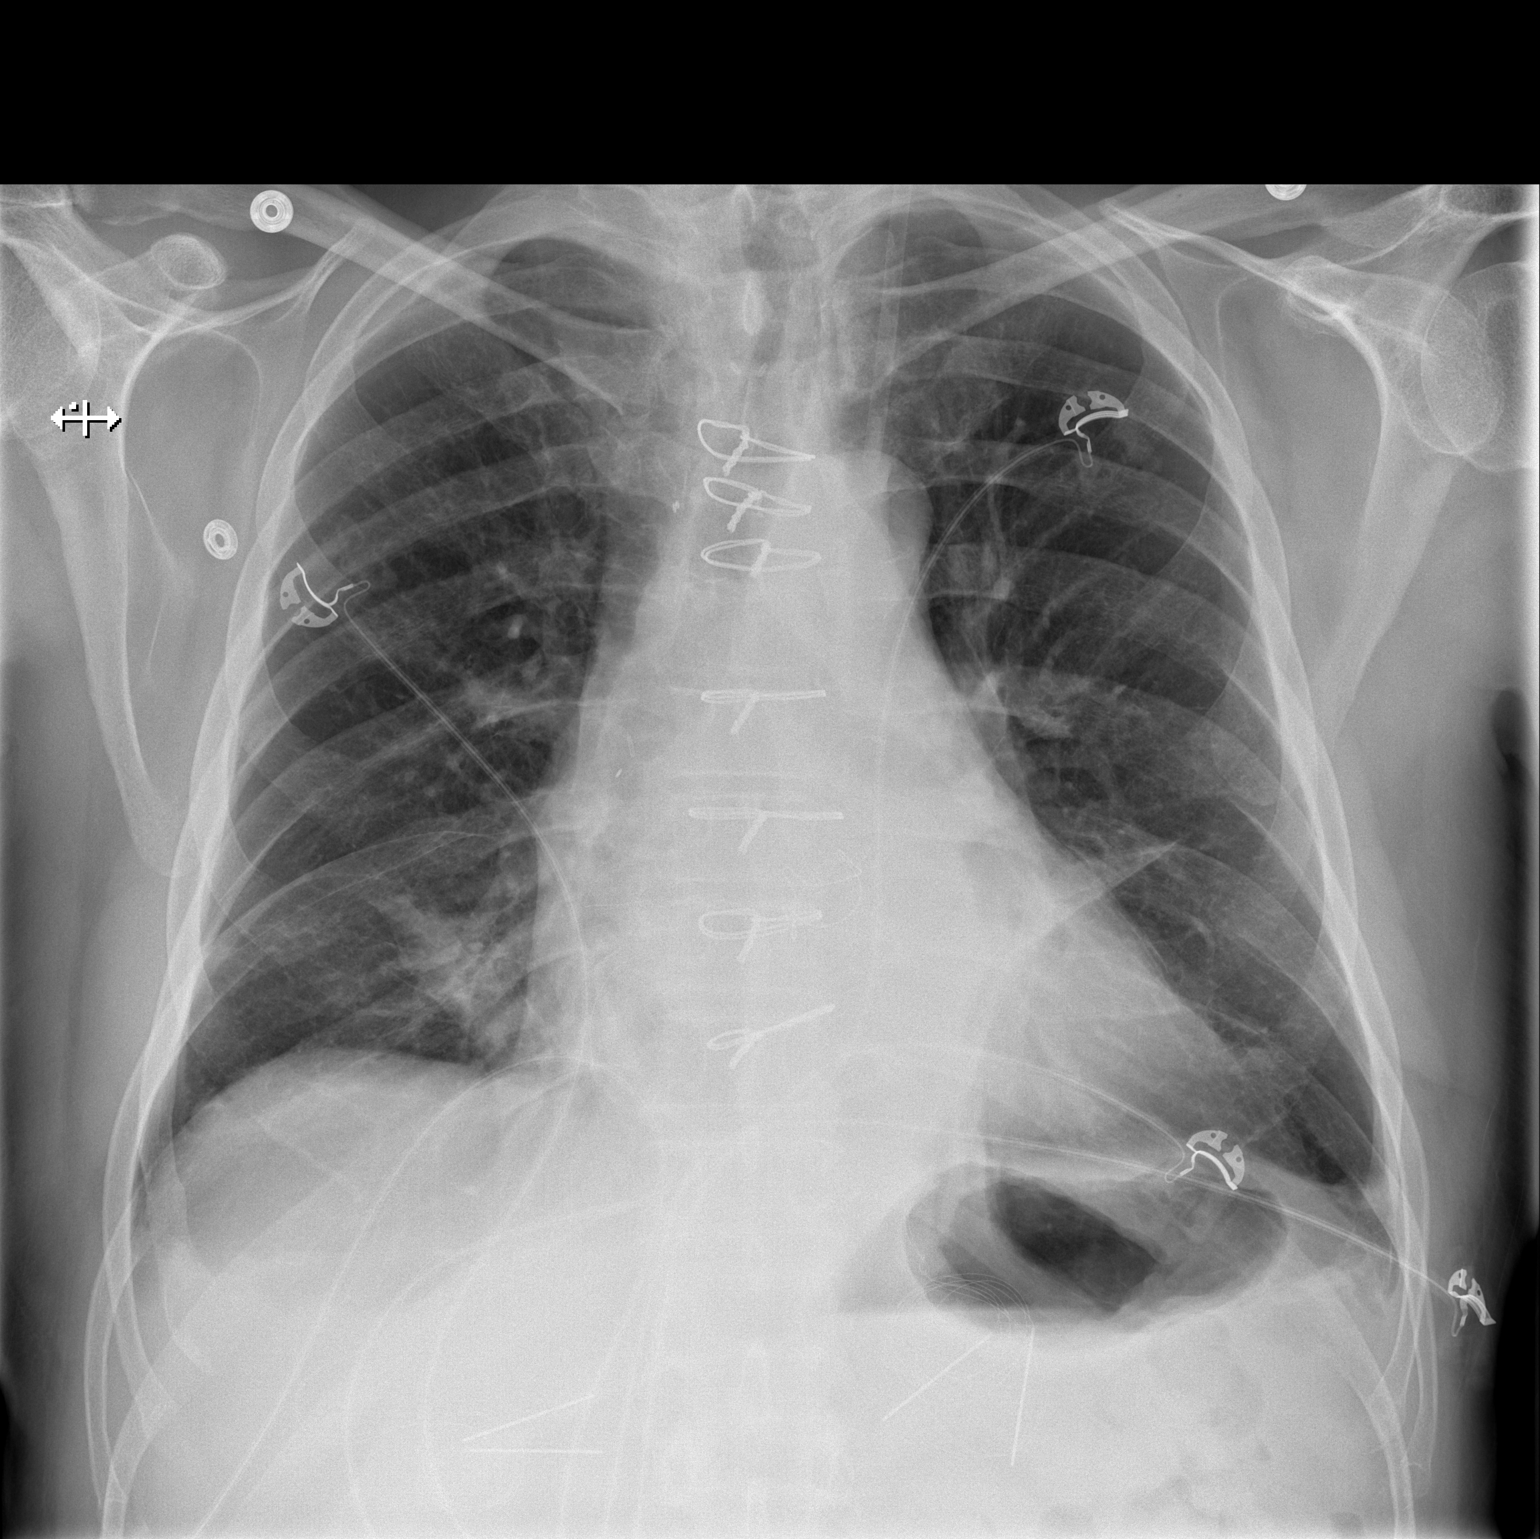

[w chest lat]
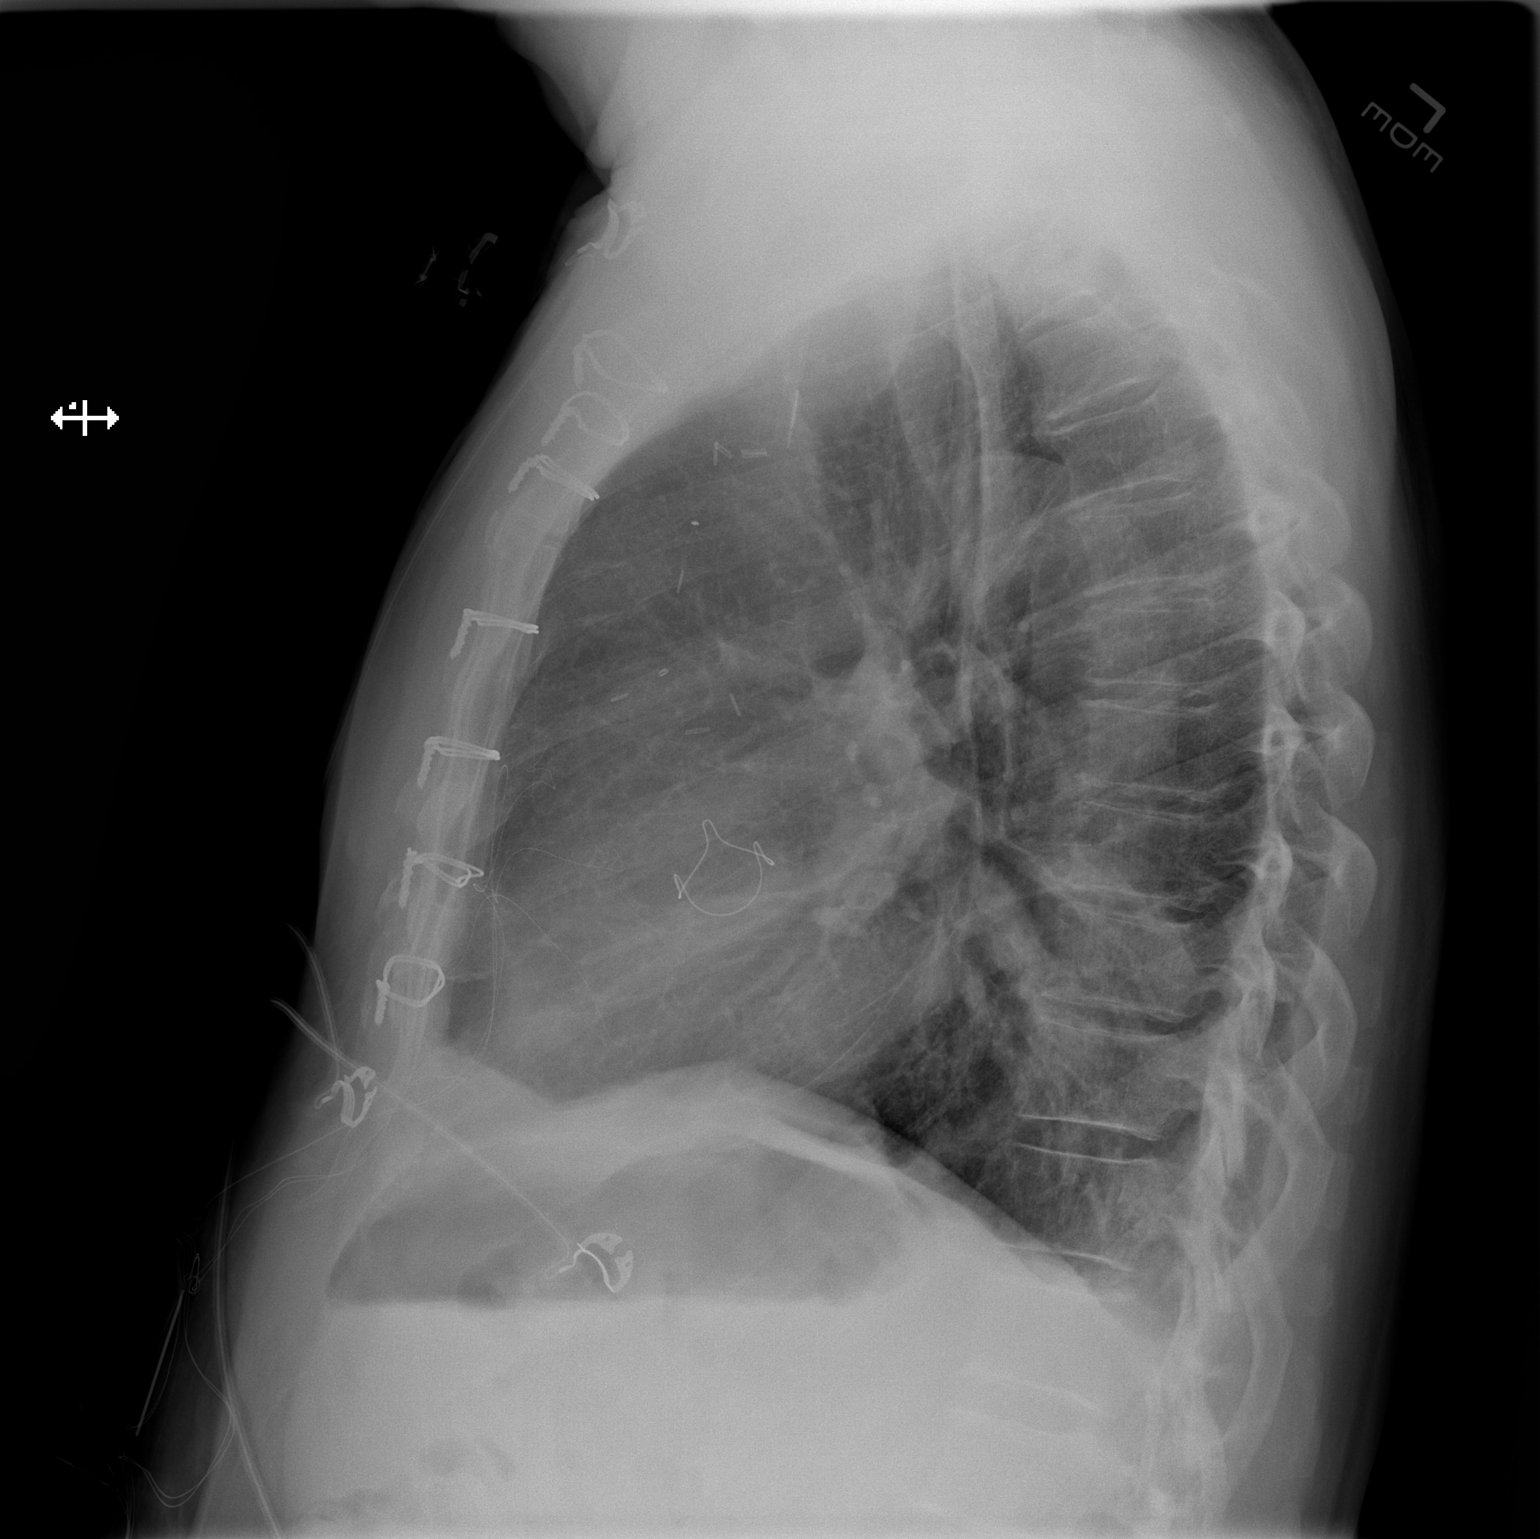

[2 of 2 positions shown; findings below may reference images not displayed]

FINDINGS: Left jugular central line is still present.  Epicardial
pacer wires are present. Postoperative changes consistent with
cardiac valve surgery.  Few densities at the lung bases are
suggestive for atelectasis.  There may be trace pleural effusions.
No evidence for a pneumothorax.  Heart size is stable.
IMPRESSION: Postoperative changes with basilar atelectasis.

## 2014-03-04 ENCOUNTER — Telehealth: Payer: Self-pay | Admitting: Cardiology

## 2014-03-04 NOTE — Telephone Encounter (Signed)
New Msg  Patient states he has not received his flu shot for the year and would like to come to the clinic Saturday but isn't able to set a specific time to come.

## 2014-03-09 DIAGNOSIS — N400 Enlarged prostate without lower urinary tract symptoms: Secondary | ICD-10-CM | POA: Diagnosis not present

## 2014-03-12 ENCOUNTER — Encounter (HOSPITAL_COMMUNITY): Payer: Self-pay | Admitting: Cardiology

## 2014-03-17 DIAGNOSIS — Z23 Encounter for immunization: Secondary | ICD-10-CM | POA: Diagnosis not present

## 2014-04-07 DIAGNOSIS — Z23 Encounter for immunization: Secondary | ICD-10-CM | POA: Diagnosis not present

## 2014-04-30 ENCOUNTER — Other Ambulatory Visit: Payer: Self-pay | Admitting: *Deleted

## 2014-04-30 DIAGNOSIS — F419 Anxiety disorder, unspecified: Secondary | ICD-10-CM

## 2014-04-30 MED ORDER — ALPRAZOLAM 1 MG PO TABS: 1.0000 mg | ORAL_TABLET | Freq: Every day | ORAL | Status: DC | PRN

## 2014-05-01 ENCOUNTER — Telehealth: Payer: Self-pay

## 2014-05-02 NOTE — Telephone Encounter (Signed)
Ok to refill xanax 

## 2014-05-05 NOTE — Telephone Encounter (Signed)
Called an rx in for xanax

## 2014-06-08 ENCOUNTER — Other Ambulatory Visit (INDEPENDENT_AMBULATORY_CARE_PROVIDER_SITE_OTHER): Payer: Medicare Other | Admitting: *Deleted

## 2014-06-08 DIAGNOSIS — I119 Hypertensive heart disease without heart failure: Secondary | ICD-10-CM

## 2014-06-08 DIAGNOSIS — E782 Mixed hyperlipidemia: Secondary | ICD-10-CM

## 2014-06-08 DIAGNOSIS — D509 Iron deficiency anemia, unspecified: Secondary | ICD-10-CM

## 2014-06-08 LAB — LIPID PANEL
CHOL/HDL RATIO: 3
Cholesterol: 133 mg/dL (ref 0–200)
HDL: 40.4 mg/dL (ref >39.00)
LDL Cholesterol: 62 mg/dL (ref 0–99)
NONHDL: 92.6
TRIGLYCERIDES: 151 mg/dL — AB (ref 0.0–149.0)
VLDL: 30.2 mg/dL (ref 0.0–40.0)

## 2014-06-08 LAB — HEPATIC FUNCTION PANEL
ALBUMIN: 4.1 g/dL (ref 3.5–5.2)
ALK PHOS: 57 U/L (ref 39–117)
ALT: 66 U/L — ABNORMAL HIGH (ref 0–53)
AST: 49 U/L — AB (ref 0–37)
Bilirubin, Direct: 0.1 mg/dL (ref 0.0–0.3)
TOTAL PROTEIN: 6.4 g/dL (ref 6.0–8.3)
Total Bilirubin: 0.7 mg/dL (ref 0.2–1.2)

## 2014-06-08 LAB — CBC WITH DIFFERENTIAL/PLATELET
Basophils Absolute: 0 10*3/uL (ref 0.0–0.1)
Basophils Relative: 0.4 % (ref 0.0–3.0)
EOS ABS: 0.1 10*3/uL (ref 0.0–0.7)
Eosinophils Relative: 2.3 % (ref 0.0–5.0)
HCT: 42.2 % (ref 39.0–52.0)
Hemoglobin: 14.4 g/dL (ref 13.0–17.0)
Lymphocytes Relative: 37.6 % (ref 12.0–46.0)
Lymphs Abs: 2.2 10*3/uL (ref 0.7–4.0)
MCHC: 34.1 g/dL (ref 30.0–36.0)
MCV: 95 fl (ref 78.0–100.0)
Monocytes Absolute: 0.5 10*3/uL (ref 0.1–1.0)
Monocytes Relative: 8.7 % (ref 3.0–12.0)
NEUTROS PCT: 51 % (ref 43.0–77.0)
Neutro Abs: 3 10*3/uL (ref 1.4–7.7)
Platelets: 196 10*3/uL (ref 150.0–400.0)
RBC: 4.45 Mil/uL (ref 4.22–5.81)
RDW: 13.9 % (ref 11.5–15.5)
WBC: 5.9 10*3/uL (ref 4.0–10.5)

## 2014-06-08 LAB — BASIC METABOLIC PANEL
BUN: 14 mg/dL (ref 6–23)
CO2: 32 mEq/L (ref 19–32)
CREATININE: 1.1 mg/dL (ref 0.40–1.50)
Calcium: 9 mg/dL (ref 8.4–10.5)
Chloride: 106 mEq/L (ref 96–112)
GFR: 70.17 mL/min (ref >60.00)
Glucose, Bld: 98 mg/dL (ref 70–99)
Potassium: 3.7 mEq/L (ref 3.5–5.1)
Sodium: 141 mEq/L (ref 135–145)

## 2014-06-09 ENCOUNTER — Encounter: Payer: Self-pay | Admitting: Cardiology

## 2014-06-09 ENCOUNTER — Ambulatory Visit (INDEPENDENT_AMBULATORY_CARE_PROVIDER_SITE_OTHER): Payer: Medicare Other | Admitting: Cardiology

## 2014-06-09 VITALS — BP 130/84 | HR 66 | Ht 72.0 in | Wt 183.8 lb

## 2014-06-09 DIAGNOSIS — G4700 Insomnia, unspecified: Secondary | ICD-10-CM

## 2014-06-09 DIAGNOSIS — I119 Hypertensive heart disease without heart failure: Secondary | ICD-10-CM | POA: Diagnosis not present

## 2014-06-09 DIAGNOSIS — E782 Mixed hyperlipidemia: Secondary | ICD-10-CM

## 2014-06-09 DIAGNOSIS — Z954 Presence of other heart-valve replacement: Secondary | ICD-10-CM

## 2014-06-09 DIAGNOSIS — F419 Anxiety disorder, unspecified: Secondary | ICD-10-CM | POA: Diagnosis not present

## 2014-06-09 DIAGNOSIS — Z952 Presence of prosthetic heart valve: Secondary | ICD-10-CM

## 2014-06-09 MED ORDER — TEMAZEPAM 15 MG PO CAPS: 15.0000 mg | ORAL_CAPSULE | Freq: Every evening | ORAL | Status: DC | PRN

## 2014-06-09 MED ORDER — SIMVASTATIN 20 MG PO TABS: 20.0000 mg | ORAL_TABLET | Freq: Every day | ORAL | Status: DC

## 2014-06-09 MED ORDER — BUPROPION HCL 100 MG PO TABS: 100.0000 mg | ORAL_TABLET | ORAL | Status: DC

## 2014-06-09 MED ORDER — METOPROLOL SUCCINATE ER 25 MG PO TB24: ORAL_TABLET | ORAL | Status: DC

## 2014-06-09 MED ORDER — ALPRAZOLAM 1 MG PO TABS: 1.0000 mg | ORAL_TABLET | Freq: Every day | ORAL | Status: DC | PRN

## 2014-06-09 MED ORDER — AMOXICILLIN 500 MG PO CAPS: 500.0000 mg | ORAL_CAPSULE | ORAL | Status: DC | PRN

## 2014-06-09 NOTE — Patient Instructions (Signed)
Your physician recommends that you continue on your current medications as directed. Please refer to the Current Medication list given to you today.  Your physician wants you to follow-up in: 6 months with fasting labs (lp/bmet/hfp) and EKG  You will receive a reminder letter in the mail two months in advance. If you don't receive a letter, please call our office to schedule the follow-up appointment.   Recheck labs liver test in 35month

## 2014-06-09 NOTE — Progress Notes (Signed)
Quick Note:  Please make copy of labs for patient visit. ______ 
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
## 2014-07-07 ENCOUNTER — Other Ambulatory Visit: Payer: Self-pay | Admitting: Gastroenterology

## 2014-07-07 DIAGNOSIS — D120 Benign neoplasm of cecum: Secondary | ICD-10-CM | POA: Diagnosis not present

## 2014-07-07 DIAGNOSIS — Z800 Family history of malignant neoplasm of digestive organs: Secondary | ICD-10-CM | POA: Diagnosis not present

## 2014-07-07 DIAGNOSIS — K5730 Diverticulosis of large intestine without perforation or abscess without bleeding: Secondary | ICD-10-CM | POA: Diagnosis not present

## 2014-07-07 DIAGNOSIS — D126 Benign neoplasm of colon, unspecified: Secondary | ICD-10-CM | POA: Diagnosis not present

## 2014-07-07 DIAGNOSIS — Z1211 Encounter for screening for malignant neoplasm of colon: Secondary | ICD-10-CM | POA: Diagnosis not present

## 2014-07-10 ENCOUNTER — Other Ambulatory Visit (INDEPENDENT_AMBULATORY_CARE_PROVIDER_SITE_OTHER): Payer: Medicare Other | Admitting: *Deleted

## 2014-07-10 DIAGNOSIS — E785 Hyperlipidemia, unspecified: Secondary | ICD-10-CM | POA: Diagnosis not present

## 2014-07-10 LAB — HEPATIC FUNCTION PANEL
ALT: 17 U/L (ref 0–53)
AST: 22 U/L (ref 0–37)
Albumin: 4 g/dL (ref 3.5–5.2)
Alkaline Phosphatase: 44 U/L (ref 39–117)
BILIRUBIN DIRECT: 0.1 mg/dL (ref 0.0–0.3)
TOTAL PROTEIN: 6.5 g/dL (ref 6.0–8.3)
Total Bilirubin: 0.6 mg/dL (ref 0.2–1.2)

## 2014-07-10 NOTE — Addendum Note (Signed)
Addended by: Eulis Foster on: 07/10/2014 11:21 AM   Modules accepted: Orders

## 2014-07-12 NOTE — Progress Notes (Signed)
Quick Note:  Please report to patient. The recent labs are stable. Continue same medication and careful diet.The liver tests have returned to normal. ______

## 2014-07-13 ENCOUNTER — Telehealth: Payer: Self-pay | Admitting: Cardiology

## 2014-07-13 NOTE — Telephone Encounter (Signed)
New message  ° ° °Patient calling for test results.   °

## 2014-07-13 NOTE — Telephone Encounter (Signed)
Provided patient lab results. See under Result Notes 07/13/14.

## 2014-08-24 ENCOUNTER — Encounter: Payer: Self-pay | Admitting: Cardiology

## 2014-09-15 ENCOUNTER — Telehealth: Payer: Self-pay | Admitting: Cardiology

## 2014-09-15 NOTE — Telephone Encounter (Signed)
Walk in pt form-pt dropped off paper for Melinda-shes back 6/20 will give to her then

## 2014-09-22 ENCOUNTER — Telehealth: Payer: Self-pay | Admitting: Cardiology

## 2014-09-22 DIAGNOSIS — Z79899 Other long term (current) drug therapy: Secondary | ICD-10-CM

## 2014-09-22 NOTE — Telephone Encounter (Signed)
New problem   Pt need to speak to you concerning a form that he dropped off last week for long term care. Please call.

## 2014-09-22 NOTE — Telephone Encounter (Signed)
His blood pressure is running too high.  Start losartan 25 mg daily.  Have him return in one week for a follow-up BMET and blood pressure check by nurse.

## 2014-09-22 NOTE — Telephone Encounter (Signed)
Patient brought forms by last week for long term care insurance Nurse out to patients blood pressure check and was elevated Blood pressure check at office today 154/88 and 160/90 and heart rate 64 Patient did not bring his blood pressure machine, has been checking at home on his monitor and good Will forward to  Dr. Mare Ferrari for review

## 2014-09-22 NOTE — Telephone Encounter (Signed)
Spoke with patient and printed last ov and labs for patient Patient will come to office for blood pressure check since blood pressure was elevated with nurse who came out to visit patient

## 2014-09-22 NOTE — Telephone Encounter (Signed)
Left message to call back  

## 2014-09-23 MED ORDER — LOSARTAN POTASSIUM 25 MG PO TABS: 25.0000 mg | ORAL_TABLET | Freq: Every day | ORAL | Status: DC

## 2014-09-23 NOTE — Telephone Encounter (Signed)
Follow  Up    Pt is calling in to speak to rn, he said he is returning her call

## 2014-09-23 NOTE — Telephone Encounter (Signed)
Advised patient, verbalized understanding  

## 2014-09-23 NOTE — Telephone Encounter (Signed)
Left message to call back  

## 2014-09-29 ENCOUNTER — Other Ambulatory Visit (INDEPENDENT_AMBULATORY_CARE_PROVIDER_SITE_OTHER): Payer: Medicare Other

## 2014-09-29 ENCOUNTER — Telehealth: Payer: Self-pay | Admitting: *Deleted

## 2014-09-29 ENCOUNTER — Ambulatory Visit (INDEPENDENT_AMBULATORY_CARE_PROVIDER_SITE_OTHER): Payer: Medicare Other | Admitting: Cardiology

## 2014-09-29 DIAGNOSIS — I119 Hypertensive heart disease without heart failure: Secondary | ICD-10-CM

## 2014-09-29 DIAGNOSIS — Z954 Presence of other heart-valve replacement: Secondary | ICD-10-CM | POA: Diagnosis not present

## 2014-09-29 DIAGNOSIS — Z79899 Other long term (current) drug therapy: Secondary | ICD-10-CM

## 2014-09-29 DIAGNOSIS — Z952 Presence of prosthetic heart valve: Secondary | ICD-10-CM

## 2014-09-29 LAB — BASIC METABOLIC PANEL
BUN: 15 mg/dL (ref 6–23)
CO2: 32 meq/L (ref 19–32)
CREATININE: 1.11 mg/dL (ref 0.40–1.50)
Calcium: 9.5 mg/dL (ref 8.4–10.5)
Chloride: 103 mEq/L (ref 96–112)
GFR: 69.38 mL/min (ref >60.00)
Glucose, Bld: 92 mg/dL (ref 70–99)
Potassium: 4.5 mEq/L (ref 3.5–5.1)
Sodium: 141 mEq/L (ref 135–145)

## 2014-09-29 MED ORDER — METOPROLOL SUCCINATE ER 25 MG PO TB24: 25.0000 mg | ORAL_TABLET | Freq: Every day | ORAL | Status: DC

## 2014-09-29 NOTE — Patient Instructions (Addendum)
Medication Instructions:  INCREASE YOUR METOPROLOL TO 25 MG DAILY IN THE MORNING  TAKE YOUR LOSARTAN IN THE EVENING  Labwork: NONE  Testing/Procedures: NONE   Any Other Special Instructions Will Be Listed Below (If Applicable). Continue to monitor your blood pressure at home and call next week with your readings  Ok to return to the gym

## 2014-09-29 NOTE — Progress Notes (Signed)
Quick Note:  Please report to patient. The recent labs are stable. Continue same medication and careful diet. ______ 

## 2014-09-29 NOTE — Telephone Encounter (Signed)
Patient in for Nurse Room visit for BP check (and to check against his home BP meter). BP's elevated. Patient verbalized increased stressors related to family health issues. Notified Dr. Mare Ferrari. He advised to work him into his schedule so that he can assess him today as the provider. Patient status changed to Dr. Sherryl Barters schedule.  Weight: 176# BP on [facility] machine = 181/84, HR 52 right arm BP on his home monitor = 168/114, HR 68 left arm  "     " "      "      "              186/98, HR 56 right arm  "     "    "      "        "              160/100  "     "    "      "        "               170/92  Provided results to Alvina Filbert, RN, for Dr. Sherryl Barters review.
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
## 2014-10-06 ENCOUNTER — Telehealth: Payer: Self-pay | Admitting: Cardiology

## 2014-10-06 NOTE — Telephone Encounter (Signed)
Scheduled follow up appointment with  Dr. Mare Ferrari

## 2014-10-06 NOTE — Telephone Encounter (Signed)
New problem   Pt was told by you to call him today to set up appt for tomorrow Dr Mare Ferrari.

## 2014-10-08 ENCOUNTER — Ambulatory Visit (INDEPENDENT_AMBULATORY_CARE_PROVIDER_SITE_OTHER): Payer: Medicare Other | Admitting: Cardiology

## 2014-10-08 ENCOUNTER — Encounter: Payer: Self-pay | Admitting: Cardiology

## 2014-10-08 VITALS — BP 132/88 | HR 58 | Ht 72.0 in | Wt 177.0 lb

## 2014-10-08 DIAGNOSIS — I119 Hypertensive heart disease without heart failure: Secondary | ICD-10-CM

## 2014-10-08 DIAGNOSIS — R351 Nocturia: Secondary | ICD-10-CM | POA: Diagnosis not present

## 2014-10-08 DIAGNOSIS — N401 Enlarged prostate with lower urinary tract symptoms: Secondary | ICD-10-CM

## 2014-10-08 DIAGNOSIS — E785 Hyperlipidemia, unspecified: Secondary | ICD-10-CM | POA: Diagnosis not present
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
## 2014-10-08 NOTE — Patient Instructions (Signed)
Medication Instructions:  Your physician recommends that you continue on your current medications as directed. Please refer to the Current Medication list given to you today.  Labwork: none  Testing/Procedures: none  Follow-Up: Your physician wants you to follow-up in: December ov/lp/bemt/hfp/bmet/cbc/ekg You will receive a reminder letter in the mail two months in advance. If you don't receive a letter, please call our office to schedule the follow-up appointment.

## 2014-10-13 DIAGNOSIS — H16223 Keratoconjunctivitis sicca, not specified as Sjogren's, bilateral: Secondary | ICD-10-CM | POA: Diagnosis not present

## 2015-01-15 ENCOUNTER — Other Ambulatory Visit: Payer: Self-pay | Admitting: Cardiology

## 2015-01-15 ENCOUNTER — Telehealth: Payer: Self-pay

## 2015-01-15 ENCOUNTER — Other Ambulatory Visit: Payer: Self-pay | Admitting: *Deleted

## 2015-01-15 DIAGNOSIS — F419 Anxiety disorder, unspecified: Secondary | ICD-10-CM

## 2015-01-15 MED ORDER — LOSARTAN POTASSIUM 25 MG PO TABS: 25.0000 mg | ORAL_TABLET | Freq: Every day | ORAL | Status: DC

## 2015-01-15 NOTE — Telephone Encounter (Signed)
error 

## 2015-01-15 NOTE — Telephone Encounter (Signed)
Darlin Coco, MD at 01/15/2015 12:43 PM     Status: Signed       Expand All Collapse All   OK to refill Xanax

## 2015-01-15 NOTE — Telephone Encounter (Signed)
Refilled as requested  

## 2015-01-15 NOTE — Telephone Encounter (Signed)
Error

## 2015-01-15 NOTE — Telephone Encounter (Signed)
OK to refill Xanax 

## 2015-01-16 MED ORDER — ALPRAZOLAM 1 MG PO TABS: 1.0000 mg | ORAL_TABLET | Freq: Every day | ORAL | Status: DC | PRN

## 2015-02-22 DIAGNOSIS — Z23 Encounter for immunization: Secondary | ICD-10-CM | POA: Diagnosis not present

## 2015-03-08 DIAGNOSIS — R3915 Urgency of urination: Secondary | ICD-10-CM | POA: Diagnosis not present

## 2015-03-08 DIAGNOSIS — N401 Enlarged prostate with lower urinary tract symptoms: Secondary | ICD-10-CM | POA: Diagnosis not present

## 2015-03-09 ENCOUNTER — Other Ambulatory Visit: Payer: Medicare Other

## 2015-03-10 ENCOUNTER — Ambulatory Visit: Payer: Medicare Other | Admitting: Cardiology

## 2015-04-27 ENCOUNTER — Other Ambulatory Visit (INDEPENDENT_AMBULATORY_CARE_PROVIDER_SITE_OTHER): Payer: Medicare Other | Admitting: *Deleted

## 2015-04-27 DIAGNOSIS — R351 Nocturia: Secondary | ICD-10-CM

## 2015-04-27 DIAGNOSIS — E785 Hyperlipidemia, unspecified: Secondary | ICD-10-CM | POA: Diagnosis not present

## 2015-04-27 DIAGNOSIS — I119 Hypertensive heart disease without heart failure: Secondary | ICD-10-CM

## 2015-04-27 DIAGNOSIS — N401 Enlarged prostate with lower urinary tract symptoms: Secondary | ICD-10-CM | POA: Diagnosis not present

## 2015-04-27 LAB — BASIC METABOLIC PANEL
BUN: 17 mg/dL (ref 7–25)
CALCIUM: 8.9 mg/dL (ref 8.6–10.3)
CO2: 30 mmol/L (ref 20–31)
CREATININE: 0.93 mg/dL (ref 0.70–1.18)
Chloride: 105 mmol/L (ref 98–110)
GLUCOSE: 94 mg/dL (ref 65–99)
Potassium: 4.6 mmol/L (ref 3.5–5.3)
SODIUM: 141 mmol/L (ref 135–146)

## 2015-04-27 LAB — HEPATIC FUNCTION PANEL
ALBUMIN: 4.2 g/dL (ref 3.6–5.1)
ALT: 20 U/L (ref 9–46)
AST: 20 U/L (ref 10–35)
Alkaline Phosphatase: 41 U/L (ref 40–115)
BILIRUBIN TOTAL: 1 mg/dL (ref 0.2–1.2)
Bilirubin, Direct: 0.2 mg/dL (ref <=0.2)
Indirect Bilirubin: 0.8 mg/dL (ref 0.2–1.2)
TOTAL PROTEIN: 6.2 g/dL (ref 6.1–8.1)

## 2015-04-27 LAB — LIPID PANEL
CHOL/HDL RATIO: 3.2 ratio (ref <=5.0)
CHOLESTEROL: 169 mg/dL (ref 125–200)
HDL: 53 mg/dL (ref >=40)
LDL Cholesterol: 99 mg/dL (ref <130)
TRIGLYCERIDES: 83 mg/dL (ref <150)
VLDL: 17 mg/dL (ref <30)

## 2015-04-28 ENCOUNTER — Ambulatory Visit: Payer: Medicare Other | Admitting: Cardiology

## 2015-04-28 LAB — PSA: PSA: 2.51 ng/mL (ref <=4.00)

## 2015-04-28 NOTE — Progress Notes (Signed)
Quick Note:  Please make copy of labs for patient visit. ______ 

## 2015-04-29 ENCOUNTER — Encounter: Payer: Self-pay | Admitting: Cardiology

## 2015-04-29 ENCOUNTER — Ambulatory Visit (INDEPENDENT_AMBULATORY_CARE_PROVIDER_SITE_OTHER): Payer: Medicare Other | Admitting: Cardiology

## 2015-04-29 VITALS — BP 130/85 | HR 55 | Ht 72.0 in | Wt 187.1 lb

## 2015-04-29 DIAGNOSIS — Z954 Presence of other heart-valve replacement: Secondary | ICD-10-CM | POA: Diagnosis not present

## 2015-04-29 DIAGNOSIS — E785 Hyperlipidemia, unspecified: Secondary | ICD-10-CM | POA: Diagnosis not present

## 2015-04-29 DIAGNOSIS — I119 Hypertensive heart disease without heart failure: Secondary | ICD-10-CM

## 2015-04-29 DIAGNOSIS — Z952 Presence of prosthetic heart valve: Secondary | ICD-10-CM
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
## 2015-04-29 NOTE — Patient Instructions (Addendum)
Medication Instructions:  Your physician recommends that you continue on your current medications as directed. Please refer to the Current Medication list given to you today.  Labwork: None ordered  Testing/Procedures: None ordered  Follow-Up: You have been referred to establish cardiology care with Dr. Martinique in 6 months.  If you need a refill on your cardiac medications before your next appointment, please call your pharmacy.  Thank you for choosing [facility]!!

## 2015-04-30 ENCOUNTER — Other Ambulatory Visit: Payer: Self-pay

## 2015-04-30 LAB — URINALYSIS

## 2015-04-30 LAB — CBC
HEMATOCRIT: 43.2 % (ref 39.0–52.0)
Hemoglobin: 14.5 g/dL (ref 13.0–17.0)
MCH: 32.9 pg (ref 26.0–34.0)
MCHC: 33.6 g/dL (ref 30.0–36.0)
MCV: 98 fL (ref 78.0–100.0)
MPV: 10.2 fL (ref 8.6–12.4)
Platelets: 164 10*3/uL (ref 150–400)
RBC: 4.41 MIL/uL (ref 4.22–5.81)
RDW: 14 % (ref 11.5–15.5)
WBC: 5.7 10*3/uL (ref 4.0–10.5)

## 2015-04-30 MED ORDER — LOSARTAN POTASSIUM 25 MG PO TABS: 25.0000 mg | ORAL_TABLET | Freq: Every day | ORAL | Status: DC

## 2015-04-30 MED ORDER — BUPROPION HCL 100 MG PO TABS: 100.0000 mg | ORAL_TABLET | ORAL | Status: DC

## 2015-04-30 MED ORDER — SIMVASTATIN 20 MG PO TABS: 20.0000 mg | ORAL_TABLET | Freq: Every day | ORAL | Status: DC

## 2015-04-30 MED ORDER — ALPRAZOLAM 1 MG PO TABS: 1.0000 mg | ORAL_TABLET | Freq: Every evening | ORAL | Status: DC | PRN

## 2015-04-30 MED ORDER — METOPROLOL SUCCINATE ER 25 MG PO TB24: 25.0000 mg | ORAL_TABLET | Freq: Every day | ORAL | Status: DC

## 2015-05-21 DIAGNOSIS — R319 Hematuria, unspecified: Secondary | ICD-10-CM | POA: Diagnosis not present

## 2015-05-31 ENCOUNTER — Other Ambulatory Visit: Payer: Self-pay | Admitting: Urology

## 2015-05-31 DIAGNOSIS — N138 Other obstructive and reflux uropathy: Secondary | ICD-10-CM

## 2015-05-31 DIAGNOSIS — R828 Abnormal findings on cytological and histological examination of urine: Secondary | ICD-10-CM | POA: Diagnosis not present

## 2015-05-31 DIAGNOSIS — R3915 Urgency of urination: Secondary | ICD-10-CM | POA: Diagnosis not present

## 2015-05-31 DIAGNOSIS — R350 Frequency of micturition: Secondary | ICD-10-CM | POA: Diagnosis not present

## 2015-05-31 DIAGNOSIS — Z9889 Other specified postprocedural states: Secondary | ICD-10-CM | POA: Diagnosis not present

## 2015-05-31 DIAGNOSIS — R310 Gross hematuria: Secondary | ICD-10-CM | POA: Diagnosis not present

## 2015-05-31 DIAGNOSIS — N401 Enlarged prostate with lower urinary tract symptoms: Secondary | ICD-10-CM

## 2015-05-31 DIAGNOSIS — R319 Hematuria, unspecified: Secondary | ICD-10-CM

## 2015-06-03 ENCOUNTER — Ambulatory Visit
Admission: RE | Admit: 2015-06-03 | Discharge: 2015-06-03 | Disposition: A | Discharge status: Home / Self Care | Payer: Medicare Other | Source: Ambulatory Visit | Attending: Urology | Admitting: Urology

## 2015-06-03 DIAGNOSIS — R319 Hematuria, unspecified: Secondary | ICD-10-CM

## 2015-06-03 DIAGNOSIS — Z9889 Other specified postprocedural states: Secondary | ICD-10-CM

## 2015-06-03 DIAGNOSIS — N401 Enlarged prostate with lower urinary tract symptoms: Secondary | ICD-10-CM

## 2015-06-03 DIAGNOSIS — N138 Other obstructive and reflux uropathy: Secondary | ICD-10-CM

## 2015-06-03 DIAGNOSIS — N200 Calculus of kidney: Secondary | ICD-10-CM | POA: Diagnosis not present

## 2015-06-11 DIAGNOSIS — E7800 Pure hypercholesterolemia, unspecified: Secondary | ICD-10-CM | POA: Diagnosis not present

## 2015-06-11 DIAGNOSIS — I10 Essential (primary) hypertension: Secondary | ICD-10-CM | POA: Diagnosis not present

## 2015-06-11 DIAGNOSIS — E038 Other specified hypothyroidism: Secondary | ICD-10-CM | POA: Diagnosis not present

## 2015-06-11 DIAGNOSIS — I712 Thoracic aortic aneurysm, without rupture: Secondary | ICD-10-CM | POA: Diagnosis not present

## 2015-06-11 DIAGNOSIS — F411 Generalized anxiety disorder: Secondary | ICD-10-CM | POA: Diagnosis not present

## 2015-06-11 DIAGNOSIS — Z6825 Body mass index (BMI) 25.0-25.9, adult: Secondary | ICD-10-CM | POA: Diagnosis not present

## 2015-06-11 DIAGNOSIS — F5104 Psychophysiologic insomnia: Secondary | ICD-10-CM | POA: Diagnosis not present

## 2015-06-11 DIAGNOSIS — N401 Enlarged prostate with lower urinary tract symptoms: Secondary | ICD-10-CM | POA: Diagnosis not present

## 2015-06-11 DIAGNOSIS — Z952 Presence of prosthetic heart valve: Secondary | ICD-10-CM | POA: Diagnosis not present

## 2015-06-29 DIAGNOSIS — H16223 Keratoconjunctivitis sicca, not specified as Sjogren's, bilateral: Secondary | ICD-10-CM | POA: Diagnosis not present

## 2015-08-27 DIAGNOSIS — N281 Cyst of kidney, acquired: Secondary | ICD-10-CM | POA: Diagnosis not present

## 2015-08-27 DIAGNOSIS — N401 Enlarged prostate with lower urinary tract symptoms: Secondary | ICD-10-CM | POA: Diagnosis not present

## 2015-08-27 DIAGNOSIS — R350 Frequency of micturition: Secondary | ICD-10-CM | POA: Diagnosis not present

## 2015-08-27 DIAGNOSIS — N200 Calculus of kidney: Secondary | ICD-10-CM | POA: Diagnosis not present

## 2015-08-27 DIAGNOSIS — R319 Hematuria, unspecified: Secondary | ICD-10-CM | POA: Diagnosis not present

## 2015-09-13 ENCOUNTER — Other Ambulatory Visit: Payer: Self-pay | Admitting: Cardiology

## 2015-09-13 DIAGNOSIS — Z952 Presence of prosthetic heart valve: Secondary | ICD-10-CM

## 2015-09-13 MED ORDER — AMOXICILLIN 500 MG PO CAPS: ORAL_CAPSULE | ORAL | Status: AC

## 2015-09-13 NOTE — Telephone Encounter (Signed)
Rx(s) sent to pharmacy electronically.  

## 2015-10-20 DIAGNOSIS — Z23 Encounter for immunization: Secondary | ICD-10-CM | POA: Diagnosis not present

## 2015-11-16 ENCOUNTER — Other Ambulatory Visit: Payer: Self-pay | Admitting: Interventional Cardiology

## 2016-02-03 DIAGNOSIS — Z23 Encounter for immunization: Secondary | ICD-10-CM | POA: Diagnosis not present

## 2016-02-21 DIAGNOSIS — N401 Enlarged prostate with lower urinary tract symptoms: Secondary | ICD-10-CM | POA: Diagnosis not present

## 2016-02-21 DIAGNOSIS — N281 Cyst of kidney, acquired: Secondary | ICD-10-CM | POA: Diagnosis not present

## 2016-02-21 DIAGNOSIS — N200 Calculus of kidney: Secondary | ICD-10-CM | POA: Diagnosis not present

## 2016-02-21 DIAGNOSIS — N138 Other obstructive and reflux uropathy: Secondary | ICD-10-CM | POA: Diagnosis not present

## 2016-04-10 ENCOUNTER — Telehealth: Payer: Self-pay | Admitting: Cardiology
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
## 2016-05-05 ENCOUNTER — Encounter: Payer: Self-pay | Admitting: Cardiology

## 2016-05-05 ENCOUNTER — Ambulatory Visit (INDEPENDENT_AMBULATORY_CARE_PROVIDER_SITE_OTHER): Payer: Medicare Other | Admitting: Cardiology

## 2016-05-05 VITALS — BP 132/84 | HR 62 | Ht 72.0 in | Wt 192.6 lb

## 2016-05-05 DIAGNOSIS — E7800 Pure hypercholesterolemia, unspecified: Secondary | ICD-10-CM

## 2016-05-05 DIAGNOSIS — I119 Hypertensive heart disease without heart failure: Secondary | ICD-10-CM | POA: Diagnosis not present

## 2016-05-05 DIAGNOSIS — Z952 Presence of prosthetic heart valve: Secondary | ICD-10-CM

## 2016-05-05 NOTE — Patient Instructions (Addendum)
Continue your current therapy  Have Dr. Osborne Casco check your lab work  I will see you in one year

## 2016-05-09 DIAGNOSIS — Z125 Encounter for screening for malignant neoplasm of prostate: Secondary | ICD-10-CM | POA: Diagnosis not present

## 2016-05-09 DIAGNOSIS — I10 Essential (primary) hypertension: Secondary | ICD-10-CM | POA: Diagnosis not present

## 2016-05-09 DIAGNOSIS — E7800 Pure hypercholesterolemia, unspecified: Secondary | ICD-10-CM | POA: Diagnosis not present

## 2016-05-09 DIAGNOSIS — E038 Other specified hypothyroidism: Secondary | ICD-10-CM | POA: Diagnosis not present

## 2016-05-16 DIAGNOSIS — Z952 Presence of prosthetic heart valve: Secondary | ICD-10-CM | POA: Diagnosis not present

## 2016-05-16 DIAGNOSIS — F5104 Psychophysiologic insomnia: Secondary | ICD-10-CM | POA: Diagnosis not present

## 2016-05-16 DIAGNOSIS — I712 Thoracic aortic aneurysm, without rupture: Secondary | ICD-10-CM | POA: Diagnosis not present

## 2016-05-16 DIAGNOSIS — I10 Essential (primary) hypertension: Secondary | ICD-10-CM | POA: Diagnosis not present

## 2016-05-16 DIAGNOSIS — F411 Generalized anxiety disorder: Secondary | ICD-10-CM | POA: Diagnosis not present

## 2016-05-16 DIAGNOSIS — E7800 Pure hypercholesterolemia, unspecified: Secondary | ICD-10-CM | POA: Diagnosis not present

## 2016-05-16 DIAGNOSIS — Z6826 Body mass index (BMI) 26.0-26.9, adult: Secondary | ICD-10-CM | POA: Diagnosis not present

## 2016-05-16 DIAGNOSIS — E038 Other specified hypothyroidism: Secondary | ICD-10-CM | POA: Diagnosis not present

## 2016-05-16 DIAGNOSIS — N401 Enlarged prostate with lower urinary tract symptoms: Secondary | ICD-10-CM | POA: Diagnosis not present

## 2016-05-16 DIAGNOSIS — Z0000 Encounter for general adult medical examination without abnormal findings: Secondary | ICD-10-CM | POA: Diagnosis not present

## 2016-06-21 ENCOUNTER — Other Ambulatory Visit: Payer: Self-pay

## 2016-06-21 MED ORDER — METOPROLOL SUCCINATE ER 25 MG PO TB24: 25.0000 mg | ORAL_TABLET | Freq: Every day | ORAL | 3 refills | Status: DC

## 2016-06-28 DIAGNOSIS — H04123 Dry eye syndrome of bilateral lacrimal glands: Secondary | ICD-10-CM | POA: Diagnosis not present

## 2016-07-21 DIAGNOSIS — Z6826 Body mass index (BMI) 26.0-26.9, adult: Secondary | ICD-10-CM | POA: Diagnosis not present

## 2016-07-21 DIAGNOSIS — D1779 Benign lipomatous neoplasm of other sites: Secondary | ICD-10-CM | POA: Diagnosis not present

## 2016-07-21 DIAGNOSIS — K4090 Unilateral inguinal hernia, without obstruction or gangrene, not specified as recurrent: Secondary | ICD-10-CM | POA: Diagnosis not present

## 2016-08-01 ENCOUNTER — Other Ambulatory Visit: Payer: Self-pay

## 2016-08-01 ENCOUNTER — Telehealth: Payer: Self-pay | Admitting: Cardiology

## 2016-08-01 DIAGNOSIS — H26492 Other secondary cataract, left eye: Secondary | ICD-10-CM | POA: Diagnosis not present

## 2016-08-01 DIAGNOSIS — H02839 Dermatochalasis of unspecified eye, unspecified eyelid: Secondary | ICD-10-CM | POA: Diagnosis not present

## 2016-08-01 DIAGNOSIS — G453 Amaurosis fugax: Secondary | ICD-10-CM

## 2016-08-01 DIAGNOSIS — H53002 Unspecified amblyopia, left eye: Secondary | ICD-10-CM | POA: Diagnosis not present

## 2016-08-01 DIAGNOSIS — Z961 Presence of intraocular lens: Secondary | ICD-10-CM | POA: Diagnosis not present

## 2016-08-01 NOTE — Telephone Encounter (Signed)
New message    Pt rapid vision lost , tia , caused by heart issue

## 2016-08-01 NOTE — Telephone Encounter (Signed)
Dr.Jordan spoke to Dr.Bevis.Carotid dopplers scheduled Thurs 08/03/16 at 4:30 pm.

## 2016-08-03 ENCOUNTER — Ambulatory Visit (HOSPITAL_COMMUNITY)
Admission: RE | Admit: 2016-08-03 | Discharge: 2016-08-03 | Disposition: A | Discharge status: Home / Self Care | Payer: Medicare Other | Source: Ambulatory Visit | Attending: Cardiology | Admitting: Cardiology

## 2016-08-03 DIAGNOSIS — I10 Essential (primary) hypertension: Secondary | ICD-10-CM | POA: Insufficient documentation

## 2016-08-03 DIAGNOSIS — I6522 Occlusion and stenosis of left carotid artery: Secondary | ICD-10-CM | POA: Insufficient documentation

## 2016-08-03 DIAGNOSIS — G453 Amaurosis fugax: Secondary | ICD-10-CM | POA: Diagnosis not present

## 2016-08-03 DIAGNOSIS — Z87891 Personal history of nicotine dependence: Secondary | ICD-10-CM | POA: Insufficient documentation

## 2016-08-03 DIAGNOSIS — E785 Hyperlipidemia, unspecified: Secondary | ICD-10-CM | POA: Diagnosis not present

## 2016-08-07 DIAGNOSIS — D171 Benign lipomatous neoplasm of skin and subcutaneous tissue of trunk: Secondary | ICD-10-CM | POA: Diagnosis not present

## 2016-08-08 DIAGNOSIS — G453 Amaurosis fugax: Secondary | ICD-10-CM | POA: Diagnosis not present

## 2016-08-09 ENCOUNTER — Telehealth: Payer: Self-pay | Admitting: Cardiology

## 2016-08-09 DIAGNOSIS — G453 Amaurosis fugax: Secondary | ICD-10-CM

## 2016-08-09 NOTE — Telephone Encounter (Signed)
Becky unavailable will have to call later

## 2016-08-09 NOTE — Telephone Encounter (Signed)
[email or web address]  [email or web address] need doppler results from 5.3.18 Carotid and want to know if labs and echo was order for pt. Please call Jacqlyn Larsen

## 2016-08-14 ENCOUNTER — Telehealth: Payer: Self-pay | Admitting: Cardiology

## 2016-08-14 DIAGNOSIS — G453 Amaurosis fugax: Secondary | ICD-10-CM

## 2016-08-14 NOTE — Telephone Encounter (Signed)
Orders entered echo and lab  Becky notified-please send results when available   Paul Half (spouse) notified she will let pt know- she will await call for ECHO scheduling

## 2016-08-14 NOTE — Telephone Encounter (Signed)
Spoke with [name] [name] at Dr Talbert Forest office.  Dr Talbert Forest did order an MRI and it was normal Dr Talbert Forest is wanting to know if Dr Martinique feels Echo and labs (CRP/CBC/Sed Rate) should be done. If so, will he be ordering. If not Dr Talbert Forest will proceed with laser procedure.  Will forward to Dr Martinique for review.

## 2016-08-14 NOTE — Telephone Encounter (Signed)
We should get an Echo on him. I thought this was already ordered. Carotid dopplers are OK. I am Ok with him doing the mentioned lab work.  Peter Martinique MD, [facility]

## 2016-08-14 NOTE — Telephone Encounter (Signed)
New message      Talk to the nurse about Dr Martinique ordering echo and labs for pt. Patient is having visual disturbances buy eye exam is normal.  Dr Talbert Forest and Dr Martinique has already discussed this pt in the past patient .  Please call to get updated info

## 2016-08-15 ENCOUNTER — Telehealth (HOSPITAL_COMMUNITY): Payer: Self-pay | Admitting: Cardiology

## 2016-08-15 NOTE — Telephone Encounter (Signed)
CRP/CBC/Sed Rate entered echo entered-see other messages  Pt notified he will await ECHO scheduling

## 2016-08-18 ENCOUNTER — Other Ambulatory Visit: Payer: Medicare Other | Admitting: *Deleted

## 2016-08-18 ENCOUNTER — Other Ambulatory Visit: Payer: Self-pay

## 2016-08-18 ENCOUNTER — Ambulatory Visit (HOSPITAL_COMMUNITY): Payer: Medicare Other | Attending: Cardiology

## 2016-08-18 DIAGNOSIS — I361 Nonrheumatic tricuspid (valve) insufficiency: Secondary | ICD-10-CM | POA: Diagnosis not present

## 2016-08-18 DIAGNOSIS — I371 Nonrheumatic pulmonary valve insufficiency: Secondary | ICD-10-CM | POA: Insufficient documentation

## 2016-08-18 DIAGNOSIS — E785 Hyperlipidemia, unspecified: Secondary | ICD-10-CM | POA: Diagnosis not present

## 2016-08-18 DIAGNOSIS — I517 Cardiomegaly: Secondary | ICD-10-CM | POA: Diagnosis not present

## 2016-08-18 DIAGNOSIS — D509 Iron deficiency anemia, unspecified: Secondary | ICD-10-CM | POA: Diagnosis not present

## 2016-08-18 DIAGNOSIS — I420 Dilated cardiomyopathy: Secondary | ICD-10-CM | POA: Insufficient documentation

## 2016-08-18 DIAGNOSIS — I119 Hypertensive heart disease without heart failure: Secondary | ICD-10-CM

## 2016-08-18 DIAGNOSIS — I340 Nonrheumatic mitral (valve) insufficiency: Secondary | ICD-10-CM | POA: Insufficient documentation

## 2016-08-18 DIAGNOSIS — G453 Amaurosis fugax: Secondary | ICD-10-CM

## 2016-08-18 DIAGNOSIS — Z953 Presence of xenogenic heart valve: Secondary | ICD-10-CM | POA: Diagnosis not present

## 2016-08-18 LAB — C-REACTIVE PROTEIN

## 2016-08-18 LAB — CBC WITH DIFFERENTIAL/PLATELET
BASOS: 0 %
Basophils Absolute: 0 10*3/uL (ref 0.0–0.2)
EOS (ABSOLUTE): 0.1 10*3/uL (ref 0.0–0.4)
EOS: 1 %
HEMATOCRIT: 42 % (ref 37.5–51.0)
Hemoglobin: 14.5 g/dL (ref 13.0–17.7)
Immature Grans (Abs): 0 10*3/uL (ref 0.0–0.1)
Immature Granulocytes: 0 %
LYMPHS ABS: 2.1 10*3/uL (ref 0.7–3.1)
Lymphs: 26 %
MCH: 32.7 pg (ref 26.6–33.0)
MCHC: 34.5 g/dL (ref 31.5–35.7)
MCV: 95 fL (ref 79–97)
MONOS ABS: 0.8 10*3/uL (ref 0.1–0.9)
Monocytes: 9 %
NEUTROS ABS: 5 10*3/uL (ref 1.4–7.0)
Neutrophils: 64 %
PLATELETS: 189 10*3/uL (ref 150–379)
RBC: 4.43 x10E6/uL (ref 4.14–5.80)
RDW: 13.4 % (ref 12.3–15.4)
WBC: 8.1 10*3/uL (ref 3.4–10.8)

## 2016-08-18 LAB — SEDIMENTATION RATE: Sed Rate: 2 mm/hr (ref 0–30)

## 2016-08-18 MED ORDER — ALPRAZOLAM 1 MG PO TABS: 1.0000 mg | ORAL_TABLET | Freq: Every evening | ORAL | 3 refills | Status: DC | PRN

## 2016-08-22 DIAGNOSIS — H26492 Other secondary cataract, left eye: Secondary | ICD-10-CM | POA: Diagnosis not present

## 2016-08-22 NOTE — Telephone Encounter (Signed)
  08/15/2016 01:25 PM Phone (Outgoing) [name], [name] Runkles (Self) [phone] (M)   Left Message - Called pt and lmsg for him to CB to get scheduled for echo.    By Cherie Dark A           Close PreviousNext

## 2016-08-29 DIAGNOSIS — H26492 Other secondary cataract, left eye: Secondary | ICD-10-CM | POA: Diagnosis not present

## 2016-09-20 DIAGNOSIS — M79671 Pain in right foot: Secondary | ICD-10-CM | POA: Diagnosis not present

## 2016-09-20 DIAGNOSIS — M722 Plantar fascial fibromatosis: Secondary | ICD-10-CM | POA: Diagnosis not present

## 2016-09-20 DIAGNOSIS — Z6826 Body mass index (BMI) 26.0-26.9, adult: Secondary | ICD-10-CM | POA: Diagnosis not present

## 2016-10-27 ENCOUNTER — Encounter: Payer: Self-pay | Admitting: *Deleted

## 2016-11-02 ENCOUNTER — Encounter: Payer: Self-pay | Admitting: Podiatry

## 2016-11-02 ENCOUNTER — Ambulatory Visit (INDEPENDENT_AMBULATORY_CARE_PROVIDER_SITE_OTHER): Payer: Medicare Other | Admitting: Podiatry

## 2016-11-02 ENCOUNTER — Ambulatory Visit (INDEPENDENT_AMBULATORY_CARE_PROVIDER_SITE_OTHER): Payer: Medicare Other

## 2016-11-02 VITALS — BP 188/104 | HR 61 | Resp 16 | Ht 72.0 in | Wt 188.0 lb

## 2016-11-02 DIAGNOSIS — M722 Plantar fascial fibromatosis: Secondary | ICD-10-CM

## 2016-11-02 MED ORDER — TRIAMCINOLONE ACETONIDE 10 MG/ML IJ SUSP
10.0000 mg | Freq: Once | INTRAMUSCULAR | Status: AC
  Administered 2016-11-02: 10 mg

## 2016-11-02 NOTE — Patient Instructions (Signed)

## 2016-11-02 NOTE — Progress Notes (Signed)
   Subjective:    Patient ID: [name] [name], male    DOB: 06-16-1945, 71 y.o.   MRN: [id number]  HPI Chief Complaint  Patient presents with  . Foot Injury    Right foot; bottom of heel; pt stated, "Pain started off on the inside of the foot (medial side) and now pain is in the bottom of the heel; pain is radiating to calf; injured foot on September 20, 2016"     Review of Systems  All other systems reviewed and are negative.      Objective:   Physical Exam        Assessment & Plan:

## 2016-11-02 NOTE — Progress Notes (Signed)
Subjective:    Patient ID: [name] [name], male   DOB: 71 y.o.   MRN: [id number]   HPI patient states that he inflamed his right foot around 6 weeks ago and heel is been very sore and he is due to go to [place] in approximately 3 weeks    Review of Systems  All other systems reviewed and are negative.       Objective:  Physical Exam  Cardiovascular: Intact distal pulses.   Musculoskeletal: Normal range of motion.  Neurological: He is alert.  Skin: Skin is warm.  Nursing note and vitals reviewed.  neurovascular status intact muscle strength adequate range of motion within normal limits with patient found to have inflamed plantar heelsertional point of the tendon into the calcaneus. Patient's found to have good digital perfusion and is well oriented 3     Assessment:   Acute plantar fasciitis right with probable trauma of 6 weeks ago      Plan:   H&P condition reviewed treatment options discussed. He wants and is aggressive program as possible due to the fact is leaving for this trip in about 3 weeks and today I did go ahead and I injected the plantar fascial right 3 mg Kenalog 5 mill grams  and I dispensed an air fracture walker to completely immobilize   X-ray indicates spur formation with no indication of stress fracture or arthritis

## 2016-11-02 NOTE — Progress Notes (Signed)
Foot
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
## 2016-11-14 ENCOUNTER — Ambulatory Visit: Payer: Medicare Other | Admitting: Cardiology
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
## 2016-11-16 ENCOUNTER — Ambulatory Visit (INDEPENDENT_AMBULATORY_CARE_PROVIDER_SITE_OTHER): Payer: Medicare Other | Admitting: Cardiology

## 2016-11-16 ENCOUNTER — Ambulatory Visit (INDEPENDENT_AMBULATORY_CARE_PROVIDER_SITE_OTHER): Payer: Medicare Other | Admitting: Podiatry

## 2016-11-16 ENCOUNTER — Encounter: Payer: Self-pay | Admitting: Cardiology

## 2016-11-16 ENCOUNTER — Encounter: Payer: Self-pay | Admitting: Podiatry

## 2016-11-16 VITALS — BP 144/80 | HR 56 | Ht 71.0 in | Wt 191.0 lb

## 2016-11-16 DIAGNOSIS — I712 Thoracic aortic aneurysm, without rupture, unspecified: Secondary | ICD-10-CM

## 2016-11-16 DIAGNOSIS — M779 Enthesopathy, unspecified: Secondary | ICD-10-CM | POA: Diagnosis not present

## 2016-11-16 DIAGNOSIS — I119 Hypertensive heart disease without heart failure: Secondary | ICD-10-CM | POA: Diagnosis not present

## 2016-11-16 DIAGNOSIS — M722 Plantar fascial fibromatosis: Secondary | ICD-10-CM | POA: Diagnosis not present

## 2016-11-16 DIAGNOSIS — E7800 Pure hypercholesterolemia, unspecified: Secondary | ICD-10-CM | POA: Diagnosis not present

## 2016-11-16 DIAGNOSIS — Z952 Presence of prosthetic heart valve: Secondary | ICD-10-CM | POA: Diagnosis not present

## 2016-11-16 MED ORDER — TRIAMCINOLONE ACETONIDE 10 MG/ML IJ SUSP
10.0000 mg | Freq: Once | INTRAMUSCULAR | Status: AC
  Administered 2016-11-16: 10 mg

## 2016-11-16 MED ORDER — LOSARTAN POTASSIUM 50 MG PO TABS: 50.0000 mg | ORAL_TABLET | Freq: Every day | ORAL | 3 refills | Status: DC

## 2016-11-16 NOTE — Patient Instructions (Signed)
Increase losartan to 50 mg daily  Continue your other therapy  I will see you in 6 months

## 2016-11-16 NOTE — Addendum Note (Signed)
Addended by: Kathyrn Lass on: 11/16/2016 03:21 PM   Modules accepted: Orders

## 2016-11-16 NOTE — Progress Notes (Signed)
Subjective:    Patient ID: [name] [name], male   DOB: 71 y.o.   MRN: [id number]   HPI patient states the boot has helped me but I'm still very worried something leaving for my trip to [place] and it's still moderately sore    ROS      Objective:  Physical Exam neurovascular status intact with patient found to have exquisite discomfort plantar aspect of the right plantar fascia still noted upon deep palpation with improvement from previous visit but present     Assessment:    Acute plantar fasciitis right still present     Plan:    Reinjected the plantar fascia 3 mg Kenalog 5 mg Xylocaine and dispensed fascial brace to begin wearing and we'll start slowly reduce boot. Will be seen prior to going to [place] if symptoms recur

## 2016-11-21 DIAGNOSIS — Z0000 Encounter for general adult medical examination without abnormal findings: Secondary | ICD-10-CM | POA: Diagnosis not present

## 2016-11-21 DIAGNOSIS — I712 Thoracic aortic aneurysm, without rupture: Secondary | ICD-10-CM | POA: Diagnosis not present

## 2016-11-21 DIAGNOSIS — Z6826 Body mass index (BMI) 26.0-26.9, adult: Secondary | ICD-10-CM | POA: Diagnosis not present

## 2016-11-21 DIAGNOSIS — Z23 Encounter for immunization: Secondary | ICD-10-CM | POA: Diagnosis not present

## 2016-11-21 DIAGNOSIS — N401 Enlarged prostate with lower urinary tract symptoms: Secondary | ICD-10-CM | POA: Diagnosis not present

## 2016-11-21 DIAGNOSIS — E7800 Pure hypercholesterolemia, unspecified: Secondary | ICD-10-CM | POA: Diagnosis not present

## 2016-11-21 DIAGNOSIS — Z952 Presence of prosthetic heart valve: Secondary | ICD-10-CM | POA: Diagnosis not present

## 2016-11-21 DIAGNOSIS — I10 Essential (primary) hypertension: Secondary | ICD-10-CM | POA: Diagnosis not present

## 2016-11-21 DIAGNOSIS — M722 Plantar fascial fibromatosis: Secondary | ICD-10-CM | POA: Diagnosis not present

## 2016-11-21 DIAGNOSIS — E038 Other specified hypothyroidism: Secondary | ICD-10-CM | POA: Diagnosis not present

## 2016-11-21 DIAGNOSIS — F411 Generalized anxiety disorder: Secondary | ICD-10-CM | POA: Diagnosis not present

## 2016-11-22 DIAGNOSIS — E7800 Pure hypercholesterolemia, unspecified: Secondary | ICD-10-CM | POA: Diagnosis not present

## 2017-02-12 DIAGNOSIS — N200 Calculus of kidney: Secondary | ICD-10-CM | POA: Diagnosis not present

## 2017-02-12 DIAGNOSIS — N400 Enlarged prostate without lower urinary tract symptoms: Secondary | ICD-10-CM | POA: Diagnosis not present

## 2017-02-12 DIAGNOSIS — N281 Cyst of kidney, acquired: Secondary | ICD-10-CM | POA: Diagnosis not present

## 2017-04-23 ENCOUNTER — Other Ambulatory Visit: Payer: Self-pay

## 2017-04-23 MED ORDER — METOPROLOL SUCCINATE ER 25 MG PO TB24: 25.0000 mg | ORAL_TABLET | Freq: Every day | ORAL | 2 refills | Status: DC

## 2017-05-14 ENCOUNTER — Other Ambulatory Visit: Payer: Self-pay | Admitting: Cardiology

## 2017-05-15 DIAGNOSIS — Z125 Encounter for screening for malignant neoplasm of prostate: Secondary | ICD-10-CM | POA: Diagnosis not present

## 2017-05-15 DIAGNOSIS — I10 Essential (primary) hypertension: Secondary | ICD-10-CM | POA: Diagnosis not present

## 2017-05-15 DIAGNOSIS — R82998 Other abnormal findings in urine: Secondary | ICD-10-CM | POA: Diagnosis not present

## 2017-05-15 DIAGNOSIS — E038 Other specified hypothyroidism: Secondary | ICD-10-CM | POA: Diagnosis not present

## 2017-05-15 DIAGNOSIS — E7800 Pure hypercholesterolemia, unspecified: Secondary | ICD-10-CM | POA: Diagnosis not present

## 2017-05-16 ENCOUNTER — Telehealth: Payer: Self-pay | Admitting: Cardiology

## 2017-05-16 NOTE — Telephone Encounter (Signed)
°*  STAT* If patient is at the pharmacy, call can be transferred to refill team.   1. Which medications need to be refilled? (please list name of each medication and dose if known) Generic Xanax-please call this in today,unable to sleep  2. Which pharmacy/location (including street and city if local pharmacy) is medication to be sent to?[facility],[place]. Do they need a 30 day or 90 day supply?90 and refills

## 2017-05-17 ENCOUNTER — Other Ambulatory Visit: Payer: Self-pay

## 2017-05-17 MED ORDER — ALPRAZOLAM 1 MG PO TABS: 1.0000 mg | ORAL_TABLET | Freq: Every evening | ORAL | 0 refills | Status: DC | PRN

## 2017-05-17 NOTE — Telephone Encounter (Signed)
This should be refilled by primary care.  Khup Sapia Martinique MD, [facility]

## 2017-05-18 NOTE — Telephone Encounter (Signed)
Spoke with Dr.Jordan he advised ok to refill alprazolam.

## 2017-05-22 DIAGNOSIS — E038 Other specified hypothyroidism: Secondary | ICD-10-CM | POA: Diagnosis not present

## 2017-05-22 DIAGNOSIS — F411 Generalized anxiety disorder: Secondary | ICD-10-CM | POA: Diagnosis not present

## 2017-05-22 DIAGNOSIS — Z952 Presence of prosthetic heart valve: Secondary | ICD-10-CM | POA: Diagnosis not present

## 2017-05-22 DIAGNOSIS — Z0000 Encounter for general adult medical examination without abnormal findings: Secondary | ICD-10-CM | POA: Diagnosis not present

## 2017-05-22 DIAGNOSIS — I10 Essential (primary) hypertension: Secondary | ICD-10-CM | POA: Diagnosis not present

## 2017-05-22 DIAGNOSIS — F5104 Psychophysiologic insomnia: Secondary | ICD-10-CM | POA: Diagnosis not present

## 2017-05-22 DIAGNOSIS — N401 Enlarged prostate with lower urinary tract symptoms: Secondary | ICD-10-CM | POA: Diagnosis not present

## 2017-05-22 DIAGNOSIS — I712 Thoracic aortic aneurysm, without rupture: Secondary | ICD-10-CM | POA: Diagnosis not present

## 2017-05-22 DIAGNOSIS — Z1389 Encounter for screening for other disorder: Secondary | ICD-10-CM | POA: Diagnosis not present

## 2017-05-22 DIAGNOSIS — Z6826 Body mass index (BMI) 26.0-26.9, adult: Secondary | ICD-10-CM | POA: Diagnosis not present

## 2017-05-22 DIAGNOSIS — E7800 Pure hypercholesterolemia, unspecified: Secondary | ICD-10-CM | POA: Diagnosis not present
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
## 2017-05-24 DIAGNOSIS — Z1212 Encounter for screening for malignant neoplasm of rectum: Secondary | ICD-10-CM | POA: Diagnosis not present

## 2017-05-25 ENCOUNTER — Encounter: Payer: Self-pay | Admitting: Cardiology

## 2017-05-25 ENCOUNTER — Ambulatory Visit: Payer: PPO | Admitting: Cardiology

## 2017-05-25 VITALS — BP 134/88 | HR 58 | Ht 71.0 in | Wt 188.0 lb

## 2017-05-25 DIAGNOSIS — I712 Thoracic aortic aneurysm, without rupture, unspecified: Secondary | ICD-10-CM

## 2017-05-25 DIAGNOSIS — Z952 Presence of prosthetic heart valve: Secondary | ICD-10-CM | POA: Diagnosis not present

## 2017-05-25 DIAGNOSIS — I119 Hypertensive heart disease without heart failure: Secondary | ICD-10-CM | POA: Diagnosis not present

## 2017-05-25 NOTE — Patient Instructions (Addendum)
Continue your current therapy  I will see you in 6 months.   

## 2017-08-01 ENCOUNTER — Telehealth: Payer: Self-pay | Admitting: Cardiology

## 2017-08-01 DIAGNOSIS — H3582 Retinal ischemia: Secondary | ICD-10-CM

## 2017-08-01 DIAGNOSIS — H539 Unspecified visual disturbance: Secondary | ICD-10-CM

## 2017-08-01 NOTE — Telephone Encounter (Signed)
Spoke with pt re message and was told by Dr Nicki Reaper the need to contact cardiologist due to findings of right eye (cotton wool spot) eye MD states this is related to lack of O2 getting to eye. Carotid from last year was good. Will forward to Dr Martinique for review .Adonis Housekeeper

## 2017-08-01 NOTE — Telephone Encounter (Signed)
New Message  Pt states he went to the eye doctor (Dr. Nicki Reaper) and they told him that blood was getting into his eyeball, oxygen to his eye and wanted him to reach out to his cardiologist. Please call

## 2017-08-01 NOTE — Telephone Encounter (Signed)
Spoke to patient he stated he had eye exam yesterday with Dr.Jon Nicki Reaper.Stated Dr.Scott saw something different in right eye this year.Stated this exam was different than last year.Stated Dr.Scott was very concerned.He was suppose to fax Dr.Jordan his office note.Advised we did not receive office note.Dr.Scott's office called and office note requested.Advised I will show Dr.Jordan tomorrow when he is back in office.

## 2017-08-01 NOTE — Telephone Encounter (Signed)
We went through this work up last year for visual changes. Carotid dopplers and Echo were OK. I have nothing further to add at this point.  Giulianna Rocha Martinique MD, [facility]

## 2017-08-02 NOTE — Telephone Encounter (Signed)
Spoke to patient Dr.Jordan reviewed Dr.Scott's eye exam.He recommended cranial MRA.Scheduler will call back to schedule.

## 2017-08-03 ENCOUNTER — Ambulatory Visit
Admission: RE | Admit: 2017-08-03 | Discharge: 2017-08-03 | Disposition: A | Discharge status: Home / Self Care | Payer: PPO | Source: Ambulatory Visit | Attending: Cardiology | Admitting: Cardiology

## 2017-08-03 DIAGNOSIS — H3582 Retinal ischemia: Secondary | ICD-10-CM
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
## 2017-08-06 ENCOUNTER — Encounter: Payer: Self-pay | Admitting: Cardiology

## 2017-08-07 ENCOUNTER — Ambulatory Visit (INDEPENDENT_AMBULATORY_CARE_PROVIDER_SITE_OTHER): Payer: PPO | Admitting: Cardiology

## 2017-08-07 ENCOUNTER — Encounter: Payer: Self-pay | Admitting: Cardiology

## 2017-08-07 VITALS — BP 148/86 | HR 55 | Ht 72.0 in | Wt 179.0 lb

## 2017-08-07 DIAGNOSIS — Z952 Presence of prosthetic heart valve: Secondary | ICD-10-CM

## 2017-08-07 DIAGNOSIS — H3582 Retinal ischemia: Secondary | ICD-10-CM

## 2017-08-07 DIAGNOSIS — I712 Thoracic aortic aneurysm, without rupture, unspecified: Secondary | ICD-10-CM

## 2017-11-14 ENCOUNTER — Other Ambulatory Visit: Payer: Self-pay | Admitting: Cardiology

## 2017-11-15 ENCOUNTER — Telehealth: Payer: Self-pay | Admitting: Cardiology

## 2017-11-15 NOTE — Telephone Encounter (Signed)
New Message     *STAT* If patient is at the pharmacy, call can be transferred to refill team.   1. Which medications need to be refilled? (please list name of each medication and dose if known) ALPRAZolam (XANAX) 1 MG tablet   2. Which pharmacy/location (including street and city if local pharmacy) is medication to be sent to? [place], [place] - [address]  3. Do they need a 30 day or 90 day supply? 30 day

## 2017-11-16 ENCOUNTER — Telehealth: Payer: Self-pay | Admitting: Cardiology

## 2017-11-16 NOTE — Telephone Encounter (Signed)
New message:      Pt c/o medication issue:  1. Name of Medication: ALPRAZolam (XANAX) 1 MG tablet  2. How are you currently taking this medication (dosage and times per day)? Take 1 tablet (1 mg total) by mouth at bedtime as needed for sleep.  3. Are you having a reaction (difficulty breathing--STAT)? no  4. What is your medication issue? Pt states he is out of this medication and has called 3x times. I have spoken to a RN pertaining to this but just sending over the msg now

## 2017-11-16 NOTE — Telephone Encounter (Signed)
I seen that the RX was sent over to Dr.Jordan to verify if okay to call in the prescription. Advised with call rep that Dr.Jordan would be rounding next week, and he could then verify if okay to have Xanax called in.

## 2017-11-18 NOTE — Telephone Encounter (Signed)
OK to refill alprazolam  Armoni Depass MD, FACC  

## 2017-11-19 ENCOUNTER — Other Ambulatory Visit: Payer: Self-pay | Admitting: Cardiology

## 2017-11-19 DIAGNOSIS — E038 Other specified hypothyroidism: Secondary | ICD-10-CM | POA: Diagnosis not present

## 2017-11-19 DIAGNOSIS — I10 Essential (primary) hypertension: Secondary | ICD-10-CM | POA: Diagnosis not present

## 2017-11-19 DIAGNOSIS — N401 Enlarged prostate with lower urinary tract symptoms: Secondary | ICD-10-CM | POA: Diagnosis not present

## 2017-11-19 DIAGNOSIS — F5104 Psychophysiologic insomnia: Secondary | ICD-10-CM | POA: Diagnosis not present

## 2017-11-19 DIAGNOSIS — Z952 Presence of prosthetic heart valve: Secondary | ICD-10-CM | POA: Diagnosis not present

## 2017-11-19 DIAGNOSIS — Z6825 Body mass index (BMI) 25.0-25.9, adult: Secondary | ICD-10-CM | POA: Diagnosis not present

## 2017-11-19 DIAGNOSIS — F411 Generalized anxiety disorder: Secondary | ICD-10-CM | POA: Diagnosis not present

## 2017-11-19 DIAGNOSIS — H3562 Retinal hemorrhage, left eye: Secondary | ICD-10-CM | POA: Diagnosis not present

## 2017-11-19 DIAGNOSIS — I712 Thoracic aortic aneurysm, without rupture: Secondary | ICD-10-CM | POA: Diagnosis not present

## 2017-11-19 DIAGNOSIS — E7800 Pure hypercholesterolemia, unspecified: Secondary | ICD-10-CM | POA: Diagnosis not present

## 2017-11-19 NOTE — Telephone Encounter (Signed)
See other telephone message - phoned prescription

## 2017-11-19 NOTE — Telephone Encounter (Signed)
Left message to patient - xanax 1 mg prn for sleep--phoned -in to [facility]

## 2017-11-19 NOTE — Telephone Encounter (Signed)
Follow Up:    Pt calling to find out if his Xanax have been called in. He needs them today, he is going out of town please.

## 2017-12-11 DIAGNOSIS — K5730 Diverticulosis of large intestine without perforation or abscess without bleeding: Secondary | ICD-10-CM | POA: Diagnosis not present

## 2017-12-11 DIAGNOSIS — Z86010 Personal history of colonic polyps: Secondary | ICD-10-CM | POA: Diagnosis not present

## 2018-01-13 ENCOUNTER — Other Ambulatory Visit: Payer: Self-pay | Admitting: Cardiology

## 2018-02-14 ENCOUNTER — Other Ambulatory Visit: Payer: Self-pay | Admitting: Cardiology

## 2018-02-15 ENCOUNTER — Other Ambulatory Visit: Payer: Self-pay | Admitting: Cardiology

## 2018-02-15 DIAGNOSIS — H16223 Keratoconjunctivitis sicca, not specified as Sjogren's, bilateral: Secondary | ICD-10-CM | POA: Diagnosis not present

## 2018-02-18 ENCOUNTER — Other Ambulatory Visit: Payer: Self-pay | Admitting: Cardiology

## 2018-02-18 NOTE — Telephone Encounter (Signed)
OK to refill Xanax with 2 refills.   Martinique MD, [facility]

## 2018-02-25 DIAGNOSIS — N200 Calculus of kidney: Secondary | ICD-10-CM | POA: Diagnosis not present

## 2018-02-25 DIAGNOSIS — N3281 Overactive bladder: Secondary | ICD-10-CM | POA: Diagnosis not present

## 2018-02-25 DIAGNOSIS — N3289 Other specified disorders of bladder: Secondary | ICD-10-CM | POA: Diagnosis not present

## 2018-02-25 DIAGNOSIS — N400 Enlarged prostate without lower urinary tract symptoms: Secondary | ICD-10-CM | POA: Diagnosis not present

## 2018-02-25 DIAGNOSIS — N281 Cyst of kidney, acquired: Secondary | ICD-10-CM | POA: Diagnosis not present

## 2018-03-03 ENCOUNTER — Other Ambulatory Visit: Payer: Self-pay | Admitting: Cardiology

## 2018-03-05 DIAGNOSIS — H0235 Blepharochalasis left lower eyelid: Secondary | ICD-10-CM | POA: Diagnosis not present

## 2018-03-05 DIAGNOSIS — H02413 Mechanical ptosis of bilateral eyelids: Secondary | ICD-10-CM | POA: Diagnosis not present

## 2018-03-05 DIAGNOSIS — H0232 Blepharochalasis right lower eyelid: Secondary | ICD-10-CM | POA: Diagnosis not present

## 2018-03-05 DIAGNOSIS — H57813 Brow ptosis, bilateral: Secondary | ICD-10-CM | POA: Diagnosis not present

## 2018-03-05 DIAGNOSIS — H53483 Generalized contraction of visual field, bilateral: Secondary | ICD-10-CM | POA: Diagnosis not present

## 2018-03-05 DIAGNOSIS — H0279 Other degenerative disorders of eyelid and periocular area: Secondary | ICD-10-CM | POA: Diagnosis not present

## 2018-05-10 DIAGNOSIS — H0232 Blepharochalasis right lower eyelid: Secondary | ICD-10-CM | POA: Diagnosis not present

## 2018-05-10 DIAGNOSIS — H0279 Other degenerative disorders of eyelid and periocular area: Secondary | ICD-10-CM | POA: Diagnosis not present

## 2018-05-10 DIAGNOSIS — H02413 Mechanical ptosis of bilateral eyelids: Secondary | ICD-10-CM | POA: Diagnosis not present

## 2018-05-10 DIAGNOSIS — H0235 Blepharochalasis left lower eyelid: Secondary | ICD-10-CM | POA: Diagnosis not present

## 2018-05-10 DIAGNOSIS — H53483 Generalized contraction of visual field, bilateral: Secondary | ICD-10-CM | POA: Diagnosis not present

## 2018-05-10 DIAGNOSIS — H57813 Brow ptosis, bilateral: Secondary | ICD-10-CM | POA: Diagnosis not present

## 2018-05-21 DIAGNOSIS — I10 Essential (primary) hypertension: Secondary | ICD-10-CM | POA: Diagnosis not present

## 2018-05-21 DIAGNOSIS — E038 Other specified hypothyroidism: Secondary | ICD-10-CM | POA: Diagnosis not present

## 2018-05-21 DIAGNOSIS — E7800 Pure hypercholesterolemia, unspecified: Secondary | ICD-10-CM | POA: Diagnosis not present

## 2018-05-21 DIAGNOSIS — Z125 Encounter for screening for malignant neoplasm of prostate: Secondary | ICD-10-CM | POA: Diagnosis not present

## 2018-05-21 DIAGNOSIS — R82998 Other abnormal findings in urine: Secondary | ICD-10-CM | POA: Diagnosis not present

## 2018-05-22 ENCOUNTER — Other Ambulatory Visit: Payer: Self-pay | Admitting: Cardiology

## 2018-05-22 NOTE — Telephone Encounter (Signed)
Rx(s) sent to pharmacy electronically.  

## 2018-05-28 DIAGNOSIS — Z952 Presence of prosthetic heart valve: Secondary | ICD-10-CM | POA: Diagnosis not present

## 2018-05-28 DIAGNOSIS — I712 Thoracic aortic aneurysm, without rupture: Secondary | ICD-10-CM | POA: Diagnosis not present

## 2018-05-28 DIAGNOSIS — F5104 Psychophysiologic insomnia: Secondary | ICD-10-CM | POA: Diagnosis not present

## 2018-05-28 DIAGNOSIS — Z6825 Body mass index (BMI) 25.0-25.9, adult: Secondary | ICD-10-CM | POA: Diagnosis not present

## 2018-05-28 DIAGNOSIS — I129 Hypertensive chronic kidney disease with stage 1 through stage 4 chronic kidney disease, or unspecified chronic kidney disease: Secondary | ICD-10-CM | POA: Diagnosis not present

## 2018-05-28 DIAGNOSIS — H3562 Retinal hemorrhage, left eye: Secondary | ICD-10-CM | POA: Diagnosis not present

## 2018-05-28 DIAGNOSIS — N183 Chronic kidney disease, stage 3 (moderate): Secondary | ICD-10-CM | POA: Diagnosis not present

## 2018-05-28 DIAGNOSIS — F411 Generalized anxiety disorder: Secondary | ICD-10-CM | POA: Diagnosis not present

## 2018-05-28 DIAGNOSIS — Z0000 Encounter for general adult medical examination without abnormal findings: Secondary | ICD-10-CM | POA: Diagnosis not present

## 2018-05-28 DIAGNOSIS — E038 Other specified hypothyroidism: Secondary | ICD-10-CM | POA: Diagnosis not present

## 2018-05-28 DIAGNOSIS — N401 Enlarged prostate with lower urinary tract symptoms: Secondary | ICD-10-CM | POA: Diagnosis not present

## 2018-05-28 DIAGNOSIS — Z1331 Encounter for screening for depression: Secondary | ICD-10-CM | POA: Diagnosis not present

## 2018-06-05 DIAGNOSIS — Z1212 Encounter for screening for malignant neoplasm of rectum: Secondary | ICD-10-CM | POA: Diagnosis not present

## 2018-08-20 ENCOUNTER — Other Ambulatory Visit: Payer: Self-pay | Admitting: Cardiology

## 2018-08-20 NOTE — Telephone Encounter (Signed)
OK to refill alprazolam.  Gennesis Hogland Martinique MD, [facility]

## 2018-09-22 ENCOUNTER — Other Ambulatory Visit: Payer: Self-pay | Admitting: Cardiology
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
## 2018-10-14 ENCOUNTER — Ambulatory Visit: Payer: PPO | Admitting: Cardiology
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
## 2018-10-16 ENCOUNTER — Ambulatory Visit (INDEPENDENT_AMBULATORY_CARE_PROVIDER_SITE_OTHER): Payer: PPO | Admitting: Cardiology

## 2018-10-16 ENCOUNTER — Other Ambulatory Visit: Payer: Self-pay

## 2018-10-16 ENCOUNTER — Encounter: Payer: Self-pay | Admitting: Cardiology

## 2018-10-16 VITALS — BP 124/74 | HR 55 | Temp 97.7°F | Ht 72.0 in | Wt 188.2 lb

## 2018-10-16 DIAGNOSIS — I119 Hypertensive heart disease without heart failure: Secondary | ICD-10-CM | POA: Diagnosis not present

## 2018-10-16 DIAGNOSIS — E7800 Pure hypercholesterolemia, unspecified: Secondary | ICD-10-CM

## 2018-10-16 DIAGNOSIS — Z952 Presence of prosthetic heart valve: Secondary | ICD-10-CM

## 2018-10-16 DIAGNOSIS — I712 Thoracic aortic aneurysm, without rupture, unspecified: Secondary | ICD-10-CM

## 2018-10-16 MED ORDER — BUPROPION HCL ER (SR) 150 MG PO TB12: 150.0000 mg | ORAL_TABLET | Freq: Every day | ORAL | 3 refills | Status: DC

## 2018-10-16 MED ORDER — ALPRAZOLAM 1 MG PO TABS: 1.0000 mg | ORAL_TABLET | Freq: Every evening | ORAL | 1 refills | Status: DC | PRN

## 2018-10-16 NOTE — Patient Instructions (Signed)

## 2018-10-16 NOTE — Addendum Note (Signed)
Addended by: Kathyrn Lass on: 10/16/2018 03:36 PM   Modules accepted: Orders

## 2018-10-29 ENCOUNTER — Other Ambulatory Visit: Payer: Self-pay | Admitting: Cardiology

## 2018-12-16 ENCOUNTER — Encounter: Payer: Self-pay | Admitting: Cardiology

## 2018-12-16 DIAGNOSIS — E039 Hypothyroidism, unspecified: Secondary | ICD-10-CM | POA: Diagnosis not present

## 2018-12-16 DIAGNOSIS — F5104 Psychophysiologic insomnia: Secondary | ICD-10-CM | POA: Diagnosis not present

## 2018-12-16 DIAGNOSIS — Z952 Presence of prosthetic heart valve: Secondary | ICD-10-CM | POA: Diagnosis not present

## 2018-12-16 DIAGNOSIS — N183 Chronic kidney disease, stage 3 (moderate): Secondary | ICD-10-CM | POA: Diagnosis not present

## 2018-12-16 DIAGNOSIS — I129 Hypertensive chronic kidney disease with stage 1 through stage 4 chronic kidney disease, or unspecified chronic kidney disease: Secondary | ICD-10-CM | POA: Diagnosis not present

## 2018-12-16 DIAGNOSIS — I712 Thoracic aortic aneurysm, without rupture: Secondary | ICD-10-CM | POA: Diagnosis not present

## 2018-12-16 DIAGNOSIS — N401 Enlarged prostate with lower urinary tract symptoms: Secondary | ICD-10-CM | POA: Diagnosis not present

## 2018-12-16 DIAGNOSIS — Z23 Encounter for immunization: Secondary | ICD-10-CM | POA: Diagnosis not present

## 2018-12-16 DIAGNOSIS — E7800 Pure hypercholesterolemia, unspecified: Secondary | ICD-10-CM | POA: Diagnosis not present

## 2018-12-16 DIAGNOSIS — F411 Generalized anxiety disorder: Secondary | ICD-10-CM | POA: Diagnosis not present

## 2019-02-12 ENCOUNTER — Other Ambulatory Visit: Payer: Self-pay | Admitting: Cardiology

## 2019-02-15 ENCOUNTER — Other Ambulatory Visit: Payer: Self-pay | Admitting: Cardiology

## 2019-02-15 NOTE — Telephone Encounter (Signed)
Patient requesting Xanax be refilled. Okay to refill?

## 2019-02-15 NOTE — Telephone Encounter (Signed)
OK to refill Xanax  Tatsuo Musial MD, FACC  

## 2019-02-17 ENCOUNTER — Other Ambulatory Visit: Payer: Self-pay

## 2019-02-17 MED ORDER — ALPRAZOLAM 1 MG PO TABS: 1.0000 mg | ORAL_TABLET | Freq: Every evening | ORAL | 1 refills | Status: DC | PRN

## 2019-02-21 ENCOUNTER — Other Ambulatory Visit: Payer: Self-pay | Admitting: Cardiology

## 2019-03-03 DIAGNOSIS — N281 Cyst of kidney, acquired: Secondary | ICD-10-CM | POA: Diagnosis not present

## 2019-03-03 DIAGNOSIS — N401 Enlarged prostate with lower urinary tract symptoms: Secondary | ICD-10-CM | POA: Diagnosis not present

## 2019-03-03 DIAGNOSIS — N200 Calculus of kidney: Secondary | ICD-10-CM | POA: Diagnosis not present

## 2019-03-03 DIAGNOSIS — N138 Other obstructive and reflux uropathy: Secondary | ICD-10-CM | POA: Diagnosis not present

## 2019-04-10 ENCOUNTER — Ambulatory Visit: Payer: PPO | Attending: Internal Medicine

## 2019-04-10 DIAGNOSIS — Z20822 Contact with and (suspected) exposure to covid-19: Secondary | ICD-10-CM

## 2019-04-12 LAB — NOVEL CORONAVIRUS, NAA: SARS-CoV-2, NAA: NOT DETECTED

## 2019-05-27 DIAGNOSIS — Z125 Encounter for screening for malignant neoplasm of prostate: Secondary | ICD-10-CM | POA: Diagnosis not present

## 2019-05-27 DIAGNOSIS — E7800 Pure hypercholesterolemia, unspecified: Secondary | ICD-10-CM | POA: Diagnosis not present

## 2019-05-27 DIAGNOSIS — E038 Other specified hypothyroidism: Secondary | ICD-10-CM | POA: Diagnosis not present

## 2019-05-28 ENCOUNTER — Ambulatory Visit: Payer: PPO | Admitting: Physician Assistant

## 2019-06-03 DIAGNOSIS — F411 Generalized anxiety disorder: Secondary | ICD-10-CM | POA: Diagnosis not present

## 2019-06-03 DIAGNOSIS — F5104 Psychophysiologic insomnia: Secondary | ICD-10-CM | POA: Diagnosis not present

## 2019-06-03 DIAGNOSIS — D692 Other nonthrombocytopenic purpura: Secondary | ICD-10-CM | POA: Diagnosis not present

## 2019-06-03 DIAGNOSIS — Z1339 Encounter for screening examination for other mental health and behavioral disorders: Secondary | ICD-10-CM | POA: Diagnosis not present

## 2019-06-03 DIAGNOSIS — Z952 Presence of prosthetic heart valve: Secondary | ICD-10-CM | POA: Diagnosis not present

## 2019-06-03 DIAGNOSIS — Z1331 Encounter for screening for depression: Secondary | ICD-10-CM | POA: Diagnosis not present

## 2019-06-03 DIAGNOSIS — R82998 Other abnormal findings in urine: Secondary | ICD-10-CM | POA: Diagnosis not present

## 2019-06-03 DIAGNOSIS — E039 Hypothyroidism, unspecified: Secondary | ICD-10-CM | POA: Diagnosis not present

## 2019-06-03 DIAGNOSIS — N401 Enlarged prostate with lower urinary tract symptoms: Secondary | ICD-10-CM | POA: Diagnosis not present

## 2019-06-03 DIAGNOSIS — I712 Thoracic aortic aneurysm, without rupture: Secondary | ICD-10-CM | POA: Diagnosis not present

## 2019-06-03 DIAGNOSIS — Z0000 Encounter for general adult medical examination without abnormal findings: Secondary | ICD-10-CM | POA: Diagnosis not present

## 2019-06-03 DIAGNOSIS — E7800 Pure hypercholesterolemia, unspecified: Secondary | ICD-10-CM | POA: Diagnosis not present

## 2019-06-03 DIAGNOSIS — I10 Essential (primary) hypertension: Secondary | ICD-10-CM | POA: Diagnosis not present

## 2019-06-10 ENCOUNTER — Other Ambulatory Visit: Payer: Self-pay | Admitting: Cardiology
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
## 2019-07-08 ENCOUNTER — Other Ambulatory Visit: Payer: Self-pay

## 2019-07-08 ENCOUNTER — Ambulatory Visit (INDEPENDENT_AMBULATORY_CARE_PROVIDER_SITE_OTHER): Payer: PPO | Admitting: Cardiology

## 2019-07-08 ENCOUNTER — Encounter: Payer: Self-pay | Admitting: Cardiology

## 2019-07-08 VITALS — BP 135/90 | HR 59 | Ht 72.0 in | Wt 185.4 lb

## 2019-07-08 DIAGNOSIS — I712 Thoracic aortic aneurysm, without rupture, unspecified: Secondary | ICD-10-CM

## 2019-07-08 DIAGNOSIS — E7800 Pure hypercholesterolemia, unspecified: Secondary | ICD-10-CM

## 2019-07-08 DIAGNOSIS — Z952 Presence of prosthetic heart valve: Secondary | ICD-10-CM

## 2019-07-08 DIAGNOSIS — I119 Hypertensive heart disease without heart failure: Secondary | ICD-10-CM

## 2019-08-05 ENCOUNTER — Other Ambulatory Visit: Payer: Self-pay | Admitting: Cardiology

## 2019-08-05 ENCOUNTER — Telehealth: Payer: Self-pay

## 2019-08-05 NOTE — Telephone Encounter (Signed)
Pt is requesting refill on Alprazolam 1 mg - Please advise. Thanks.

## 2019-08-06 MED ORDER — ALPRAZOLAM 1 MG PO TABS: 1.0000 mg | ORAL_TABLET | Freq: Every evening | ORAL | 1 refills | Status: DC | PRN

## 2019-08-06 NOTE — Telephone Encounter (Signed)
Alprazolam refill called in to pharmacy.  

## 2019-08-06 NOTE — Telephone Encounter (Signed)
OK to refill alprazolam  Peter Martinique MD, [facility]

## 2019-08-08 NOTE — Telephone Encounter (Signed)
This encounter was created in error - please disregard.

## 2019-10-01 ENCOUNTER — Other Ambulatory Visit: Payer: Self-pay | Admitting: Cardiology

## 2019-12-02 DIAGNOSIS — Z952 Presence of prosthetic heart valve: Secondary | ICD-10-CM | POA: Diagnosis not present

## 2019-12-02 DIAGNOSIS — I712 Thoracic aortic aneurysm, without rupture: Secondary | ICD-10-CM | POA: Diagnosis not present

## 2019-12-02 DIAGNOSIS — D692 Other nonthrombocytopenic purpura: Secondary | ICD-10-CM | POA: Diagnosis not present

## 2019-12-02 DIAGNOSIS — F5104 Psychophysiologic insomnia: Secondary | ICD-10-CM | POA: Diagnosis not present

## 2019-12-02 DIAGNOSIS — E039 Hypothyroidism, unspecified: Secondary | ICD-10-CM | POA: Diagnosis not present

## 2019-12-02 DIAGNOSIS — I10 Essential (primary) hypertension: Secondary | ICD-10-CM | POA: Diagnosis not present

## 2019-12-02 DIAGNOSIS — N401 Enlarged prostate with lower urinary tract symptoms: Secondary | ICD-10-CM | POA: Diagnosis not present

## 2019-12-02 DIAGNOSIS — E7800 Pure hypercholesterolemia, unspecified: Secondary | ICD-10-CM | POA: Diagnosis not present

## 2019-12-02 DIAGNOSIS — F411 Generalized anxiety disorder: Secondary | ICD-10-CM | POA: Diagnosis not present

## 2020-01-26 ENCOUNTER — Other Ambulatory Visit: Payer: Self-pay | Admitting: Cardiology

## 2020-01-27 ENCOUNTER — Other Ambulatory Visit: Payer: Self-pay

## 2020-01-27 MED ORDER — ALPRAZOLAM 1 MG PO TABS: 1.0000 mg | ORAL_TABLET | Freq: Every evening | ORAL | 1 refills | Status: DC | PRN

## 2020-01-27 NOTE — Telephone Encounter (Signed)
Alprazolam refill called in to pharmacy.

## 2020-01-27 NOTE — Telephone Encounter (Signed)
Ok to refill Xanax  [place]

## 2020-03-01 DIAGNOSIS — N138 Other obstructive and reflux uropathy: Secondary | ICD-10-CM | POA: Diagnosis not present

## 2020-03-01 DIAGNOSIS — N401 Enlarged prostate with lower urinary tract symptoms: Secondary | ICD-10-CM | POA: Diagnosis not present

## 2020-03-01 DIAGNOSIS — N3281 Overactive bladder: Secondary | ICD-10-CM | POA: Diagnosis not present

## 2020-03-01 DIAGNOSIS — N200 Calculus of kidney: Secondary | ICD-10-CM | POA: Diagnosis not present

## 2020-03-01 DIAGNOSIS — N281 Cyst of kidney, acquired: Secondary | ICD-10-CM | POA: Diagnosis not present

## 2020-05-31 DIAGNOSIS — E039 Hypothyroidism, unspecified: Secondary | ICD-10-CM | POA: Diagnosis not present

## 2020-05-31 DIAGNOSIS — I10 Essential (primary) hypertension: Secondary | ICD-10-CM | POA: Diagnosis not present

## 2020-05-31 DIAGNOSIS — E7800 Pure hypercholesterolemia, unspecified: Secondary | ICD-10-CM | POA: Diagnosis not present

## 2020-05-31 DIAGNOSIS — F5104 Psychophysiologic insomnia: Secondary | ICD-10-CM | POA: Diagnosis not present

## 2020-05-31 DIAGNOSIS — N401 Enlarged prostate with lower urinary tract symptoms: Secondary | ICD-10-CM | POA: Diagnosis not present

## 2020-05-31 DIAGNOSIS — F411 Generalized anxiety disorder: Secondary | ICD-10-CM | POA: Diagnosis not present

## 2020-06-02 DIAGNOSIS — Z125 Encounter for screening for malignant neoplasm of prostate: Secondary | ICD-10-CM | POA: Diagnosis not present

## 2020-06-02 DIAGNOSIS — E039 Hypothyroidism, unspecified: Secondary | ICD-10-CM | POA: Diagnosis not present

## 2020-06-02 DIAGNOSIS — E7800 Pure hypercholesterolemia, unspecified: Secondary | ICD-10-CM | POA: Diagnosis not present

## 2020-06-09 DIAGNOSIS — F411 Generalized anxiety disorder: Secondary | ICD-10-CM | POA: Diagnosis not present

## 2020-06-09 DIAGNOSIS — Z952 Presence of prosthetic heart valve: Secondary | ICD-10-CM | POA: Diagnosis not present

## 2020-06-09 DIAGNOSIS — I712 Thoracic aortic aneurysm, without rupture: Secondary | ICD-10-CM | POA: Diagnosis not present

## 2020-06-09 DIAGNOSIS — I10 Essential (primary) hypertension: Secondary | ICD-10-CM | POA: Diagnosis not present

## 2020-06-09 DIAGNOSIS — E039 Hypothyroidism, unspecified: Secondary | ICD-10-CM | POA: Diagnosis not present

## 2020-06-09 DIAGNOSIS — D692 Other nonthrombocytopenic purpura: Secondary | ICD-10-CM | POA: Diagnosis not present

## 2020-06-09 DIAGNOSIS — E7800 Pure hypercholesterolemia, unspecified: Secondary | ICD-10-CM | POA: Diagnosis not present

## 2020-06-09 DIAGNOSIS — F5104 Psychophysiologic insomnia: Secondary | ICD-10-CM | POA: Diagnosis not present

## 2020-06-09 DIAGNOSIS — R82998 Other abnormal findings in urine: Secondary | ICD-10-CM | POA: Diagnosis not present

## 2020-06-09 DIAGNOSIS — Z1212 Encounter for screening for malignant neoplasm of rectum: Secondary | ICD-10-CM | POA: Diagnosis not present

## 2020-06-09 DIAGNOSIS — N401 Enlarged prostate with lower urinary tract symptoms: Secondary | ICD-10-CM | POA: Diagnosis not present

## 2020-06-09 DIAGNOSIS — Z0000 Encounter for general adult medical examination without abnormal findings: Secondary | ICD-10-CM | POA: Diagnosis not present
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
## 2020-07-08 ENCOUNTER — Other Ambulatory Visit: Payer: Self-pay | Admitting: Cardiology

## 2020-07-08 ENCOUNTER — Ambulatory Visit: Payer: PPO | Admitting: Cardiology

## 2020-07-08 ENCOUNTER — Other Ambulatory Visit: Payer: Self-pay

## 2020-07-08 ENCOUNTER — Encounter: Payer: Self-pay | Admitting: Cardiology

## 2020-07-08 VITALS — BP 122/62 | HR 57 | Ht 72.0 in | Wt 185.0 lb

## 2020-07-08 DIAGNOSIS — Z952 Presence of prosthetic heart valve: Secondary | ICD-10-CM

## 2020-07-08 DIAGNOSIS — I119 Hypertensive heart disease without heart failure: Secondary | ICD-10-CM | POA: Diagnosis not present

## 2020-07-08 DIAGNOSIS — I712 Thoracic aortic aneurysm, without rupture, unspecified: Secondary | ICD-10-CM

## 2020-07-08 DIAGNOSIS — E7800 Pure hypercholesterolemia, unspecified: Secondary | ICD-10-CM | POA: Diagnosis not present

## 2020-07-08 MED ORDER — LOSARTAN POTASSIUM 25 MG PO TABS: 25.0000 mg | ORAL_TABLET | Freq: Every day | ORAL | 3 refills | Status: DC

## 2020-07-08 MED ORDER — BUPROPION HCL ER (SR) 200 MG PO TB12: 200.0000 mg | ORAL_TABLET | Freq: Two times a day (BID) | ORAL | 3 refills | Status: DC

## 2020-07-08 NOTE — Patient Instructions (Signed)
Reduce losartan to 25 mg daily  Increase Wellbutrin SR to 200 mg daily  Continue your other therapy.

## 2020-07-08 NOTE — Addendum Note (Signed)
Addended by: Kathyrn Lass on: 07/08/2020 03:13 PM   Modules accepted: Orders

## 2020-07-20 ENCOUNTER — Other Ambulatory Visit: Payer: Self-pay | Admitting: Cardiology

## 2020-07-23 ENCOUNTER — Other Ambulatory Visit: Payer: Self-pay | Admitting: Cardiology

## 2020-07-27 ENCOUNTER — Other Ambulatory Visit: Payer: Self-pay | Admitting: Cardiology

## 2020-07-29 ENCOUNTER — Telehealth: Payer: Self-pay | Admitting: Cardiology

## 2020-07-29 ENCOUNTER — Other Ambulatory Visit: Payer: Self-pay | Admitting: Cardiology

## 2020-07-29 NOTE — Telephone Encounter (Signed)
*  STAT* If patient is at the pharmacy, call can be transferred to refill team.   1. Which medications need to be refilled? (please list name of each medication and dose if known) ALPRAZolam (XANAX) 1 MG tablet  2. Which pharmacy/location (including street and city if local pharmacy) is medication to be sent to? [place], [place] - [address]  3. Do they need a 30 day or 90 day supply? 90 day   Patient is completely out of medication and does not have dose a tonight.

## 2020-07-30 MED ORDER — ALPRAZOLAM 1 MG PO TABS: 1.0000 mg | ORAL_TABLET | Freq: Every evening | ORAL | 1 refills | Status: DC | PRN

## 2020-07-30 NOTE — Telephone Encounter (Signed)
[name] [name] is calling back in regards to this prescription stating [facility]'s system is currently down so they did not receive this refill request. He states they advised him a nurse would have to physically call the [facility] store, ask for the pharmacy and give the prescription verbally by phone. The number to do so is [phone]. He is wanting a 90 day supply. If this way of filling the prescription is unsuccessful he states he can come by the office and pick up a prescription to give to the pharmacist if need be. He is requesting a callback either way confirming what has been done and what he needs to do. Please advise.

## 2020-07-30 NOTE — Telephone Encounter (Signed)
Spoke to patient Alprazolam refill phoned into pharmacy.

## 2020-07-31 DIAGNOSIS — F411 Generalized anxiety disorder: Secondary | ICD-10-CM | POA: Diagnosis not present

## 2020-07-31 DIAGNOSIS — E039 Hypothyroidism, unspecified: Secondary | ICD-10-CM | POA: Diagnosis not present

## 2020-07-31 DIAGNOSIS — I10 Essential (primary) hypertension: Secondary | ICD-10-CM | POA: Diagnosis not present

## 2020-07-31 DIAGNOSIS — E7800 Pure hypercholesterolemia, unspecified: Secondary | ICD-10-CM | POA: Diagnosis not present

## 2020-08-31 DIAGNOSIS — E7800 Pure hypercholesterolemia, unspecified: Secondary | ICD-10-CM | POA: Diagnosis not present

## 2020-08-31 DIAGNOSIS — I10 Essential (primary) hypertension: Secondary | ICD-10-CM | POA: Diagnosis not present

## 2020-08-31 DIAGNOSIS — E039 Hypothyroidism, unspecified: Secondary | ICD-10-CM | POA: Diagnosis not present

## 2020-08-31 DIAGNOSIS — F411 Generalized anxiety disorder: Secondary | ICD-10-CM | POA: Diagnosis not present

## 2020-09-02 DIAGNOSIS — N281 Cyst of kidney, acquired: Secondary | ICD-10-CM | POA: Diagnosis not present

## 2020-09-02 DIAGNOSIS — N401 Enlarged prostate with lower urinary tract symptoms: Secondary | ICD-10-CM | POA: Diagnosis not present

## 2020-09-02 DIAGNOSIS — N200 Calculus of kidney: Secondary | ICD-10-CM | POA: Diagnosis not present

## 2020-09-02 DIAGNOSIS — N138 Other obstructive and reflux uropathy: Secondary | ICD-10-CM | POA: Diagnosis not present

## 2020-09-02 DIAGNOSIS — N3281 Overactive bladder: Secondary | ICD-10-CM | POA: Diagnosis not present

## 2020-09-22 DIAGNOSIS — L821 Other seborrheic keratosis: Secondary | ICD-10-CM | POA: Diagnosis not present

## 2020-09-22 DIAGNOSIS — L570 Actinic keratosis: Secondary | ICD-10-CM | POA: Diagnosis not present

## 2020-09-22 DIAGNOSIS — Z85828 Personal history of other malignant neoplasm of skin: Secondary | ICD-10-CM | POA: Diagnosis not present

## 2020-09-22 DIAGNOSIS — L720 Epidermal cyst: Secondary | ICD-10-CM | POA: Diagnosis not present

## 2020-09-22 DIAGNOSIS — C44319 Basal cell carcinoma of skin of other parts of face: Secondary | ICD-10-CM | POA: Diagnosis not present

## 2020-09-22 DIAGNOSIS — C44519 Basal cell carcinoma of skin of other part of trunk: Secondary | ICD-10-CM | POA: Diagnosis not present

## 2020-09-30 DIAGNOSIS — I10 Essential (primary) hypertension: Secondary | ICD-10-CM | POA: Diagnosis not present

## 2020-09-30 DIAGNOSIS — E7800 Pure hypercholesterolemia, unspecified: Secondary | ICD-10-CM | POA: Diagnosis not present

## 2020-09-30 DIAGNOSIS — E039 Hypothyroidism, unspecified: Secondary | ICD-10-CM | POA: Diagnosis not present

## 2020-12-31 DIAGNOSIS — F411 Generalized anxiety disorder: Secondary | ICD-10-CM | POA: Diagnosis not present

## 2020-12-31 DIAGNOSIS — E7800 Pure hypercholesterolemia, unspecified: Secondary | ICD-10-CM | POA: Diagnosis not present

## 2020-12-31 DIAGNOSIS — E039 Hypothyroidism, unspecified: Secondary | ICD-10-CM | POA: Diagnosis not present

## 2020-12-31 DIAGNOSIS — I10 Essential (primary) hypertension: Secondary | ICD-10-CM | POA: Diagnosis not present

## 2021-01-13 ENCOUNTER — Telehealth: Payer: Self-pay | Admitting: Cardiology

## 2021-01-13 NOTE — Telephone Encounter (Signed)
[facility] Clinical Pharmacist @ [facility] states that pts bp is elevated... pts bp average is 161/89 w/ pulse average @ 58.[name]  Pharmacist would like to increase pts Losartan to 50mg  and would like to know if Dr. Martinique is in agreement... please advise

## 2021-01-13 NOTE — Telephone Encounter (Signed)
Called Melvenia Beam to get additional information on patient before forwarding to Dr. Martinique. At office visit on 4/7, patient's losartan was decreased from 50 mg to 25 mg daily due to patient c/o lightheadedness. Melvenia Beam states the patient denied lightheadedness today but she did not ask if the lightheadedness improved with the decreased losartan dose. Last bmet in their office was 06/02/20: creatinine was 1.0, K+ 4.5, gfr 72.6. Melvenia Beam states she is faxing over patient's BP readings and she calibrated patient's cuff with hers in the office and it is accurate. I advised that someone from our office will call her back with Dr. Doug Sou advice and she thanked me for the call.

## 2021-01-14 MED ORDER — LOSARTAN POTASSIUM 50 MG PO TABS: 50.0000 mg | ORAL_TABLET | Freq: Every day | ORAL | 3 refills | Status: DC

## 2021-01-14 NOTE — Telephone Encounter (Signed)
[name] [name] at [facility] left message on her personal voice mail Dr.Jordan's advice. Called patient Dr.Jordan advised ok to increase Losartan to 50 mg daily.

## 2021-01-14 NOTE — Telephone Encounter (Signed)
[place] with me to increase losartan back to 50 mg daily  Amzie Sillas Martinique MD, [facility]

## 2021-01-16 ENCOUNTER — Other Ambulatory Visit: Payer: Self-pay | Admitting: Cardiology

## 2021-01-18 NOTE — Telephone Encounter (Signed)
OK to refill Xanax x 1  Kamaryn Grimley Martinique MD, [facility]

## 2021-01-31 DIAGNOSIS — I10 Essential (primary) hypertension: Secondary | ICD-10-CM | POA: Diagnosis not present

## 2021-01-31 DIAGNOSIS — F411 Generalized anxiety disorder: Secondary | ICD-10-CM | POA: Diagnosis not present

## 2021-01-31 DIAGNOSIS — E039 Hypothyroidism, unspecified: Secondary | ICD-10-CM | POA: Diagnosis not present

## 2021-01-31 DIAGNOSIS — E7800 Pure hypercholesterolemia, unspecified: Secondary | ICD-10-CM | POA: Diagnosis not present

## 2021-02-05 ENCOUNTER — Other Ambulatory Visit: Payer: Self-pay | Admitting: Cardiology

## 2021-02-07 ENCOUNTER — Other Ambulatory Visit: Payer: Self-pay | Admitting: Cardiology

## 2021-02-22 DIAGNOSIS — I10 Essential (primary) hypertension: Secondary | ICD-10-CM | POA: Diagnosis not present

## 2021-02-22 DIAGNOSIS — R195 Other fecal abnormalities: Secondary | ICD-10-CM | POA: Diagnosis not present

## 2021-03-02 DIAGNOSIS — F411 Generalized anxiety disorder: Secondary | ICD-10-CM | POA: Diagnosis not present

## 2021-03-02 DIAGNOSIS — I10 Essential (primary) hypertension: Secondary | ICD-10-CM | POA: Diagnosis not present

## 2021-03-02 DIAGNOSIS — E039 Hypothyroidism, unspecified: Secondary | ICD-10-CM | POA: Diagnosis not present

## 2021-03-02 DIAGNOSIS — E7800 Pure hypercholesterolemia, unspecified: Secondary | ICD-10-CM | POA: Diagnosis not present

## 2021-03-10 DIAGNOSIS — N200 Calculus of kidney: Secondary | ICD-10-CM | POA: Diagnosis not present

## 2021-03-10 DIAGNOSIS — N138 Other obstructive and reflux uropathy: Secondary | ICD-10-CM | POA: Diagnosis not present

## 2021-03-10 DIAGNOSIS — N401 Enlarged prostate with lower urinary tract symptoms: Secondary | ICD-10-CM | POA: Diagnosis not present

## 2021-03-10 DIAGNOSIS — N281 Cyst of kidney, acquired: Secondary | ICD-10-CM | POA: Diagnosis not present

## 2021-03-10 DIAGNOSIS — N3281 Overactive bladder: Secondary | ICD-10-CM | POA: Diagnosis not present

## 2021-03-14 DIAGNOSIS — L821 Other seborrheic keratosis: Secondary | ICD-10-CM | POA: Diagnosis not present

## 2021-03-14 DIAGNOSIS — Z85828 Personal history of other malignant neoplasm of skin: Secondary | ICD-10-CM | POA: Diagnosis not present

## 2021-03-14 DIAGNOSIS — C44319 Basal cell carcinoma of skin of other parts of face: Secondary | ICD-10-CM | POA: Diagnosis not present

## 2021-03-14 DIAGNOSIS — L570 Actinic keratosis: Secondary | ICD-10-CM | POA: Diagnosis not present

## 2021-03-14 DIAGNOSIS — C44612 Basal cell carcinoma of skin of right upper limb, including shoulder: Secondary | ICD-10-CM | POA: Diagnosis not present

## 2021-03-14 DIAGNOSIS — L814 Other melanin hyperpigmentation: Secondary | ICD-10-CM | POA: Diagnosis not present

## 2021-03-23 DIAGNOSIS — F411 Generalized anxiety disorder: Secondary | ICD-10-CM | POA: Diagnosis not present

## 2021-03-23 DIAGNOSIS — E7800 Pure hypercholesterolemia, unspecified: Secondary | ICD-10-CM | POA: Diagnosis not present

## 2021-03-23 DIAGNOSIS — I10 Essential (primary) hypertension: Secondary | ICD-10-CM | POA: Diagnosis not present

## 2021-03-23 DIAGNOSIS — E039 Hypothyroidism, unspecified: Secondary | ICD-10-CM | POA: Diagnosis not present

## 2021-03-31 ENCOUNTER — Other Ambulatory Visit: Payer: Self-pay | Admitting: Cardiology

## 2021-04-01 ENCOUNTER — Other Ambulatory Visit: Payer: Self-pay

## 2021-04-01 ENCOUNTER — Other Ambulatory Visit: Payer: Self-pay | Admitting: Cardiology

## 2021-04-01 DIAGNOSIS — E039 Hypothyroidism, unspecified: Secondary | ICD-10-CM | POA: Diagnosis not present

## 2021-04-01 DIAGNOSIS — F411 Generalized anxiety disorder: Secondary | ICD-10-CM | POA: Diagnosis not present

## 2021-04-01 DIAGNOSIS — I10 Essential (primary) hypertension: Secondary | ICD-10-CM | POA: Diagnosis not present

## 2021-04-01 DIAGNOSIS — E7800 Pure hypercholesterolemia, unspecified: Secondary | ICD-10-CM | POA: Diagnosis not present

## 2021-04-01 MED ORDER — ALPRAZOLAM 1 MG PO TABS: 1.0000 mg | ORAL_TABLET | Freq: Every evening | ORAL | 0 refills | Status: DC | PRN

## 2021-04-26 DIAGNOSIS — Z20822 Contact with and (suspected) exposure to covid-19: Secondary | ICD-10-CM | POA: Diagnosis not present

## 2021-05-01 DIAGNOSIS — F410 Panic disorder [episodic paroxysmal anxiety] without agoraphobia: Secondary | ICD-10-CM | POA: Diagnosis not present

## 2021-05-01 DIAGNOSIS — E7800 Pure hypercholesterolemia, unspecified: Secondary | ICD-10-CM | POA: Diagnosis not present

## 2021-05-01 DIAGNOSIS — E039 Hypothyroidism, unspecified: Secondary | ICD-10-CM | POA: Diagnosis not present

## 2021-05-01 DIAGNOSIS — I10 Essential (primary) hypertension: Secondary | ICD-10-CM | POA: Diagnosis not present

## 2021-05-31 DIAGNOSIS — I10 Essential (primary) hypertension: Secondary | ICD-10-CM | POA: Diagnosis not present

## 2021-05-31 DIAGNOSIS — E039 Hypothyroidism, unspecified: Secondary | ICD-10-CM | POA: Diagnosis not present

## 2021-05-31 DIAGNOSIS — F411 Generalized anxiety disorder: Secondary | ICD-10-CM | POA: Diagnosis not present

## 2021-05-31 DIAGNOSIS — E7800 Pure hypercholesterolemia, unspecified: Secondary | ICD-10-CM | POA: Diagnosis not present

## 2021-07-01 ENCOUNTER — Other Ambulatory Visit: Payer: Self-pay | Admitting: Cardiology

## 2021-07-01 DIAGNOSIS — F411 Generalized anxiety disorder: Secondary | ICD-10-CM | POA: Diagnosis not present

## 2021-07-01 DIAGNOSIS — E039 Hypothyroidism, unspecified: Secondary | ICD-10-CM | POA: Diagnosis not present

## 2021-07-01 DIAGNOSIS — I10 Essential (primary) hypertension: Secondary | ICD-10-CM | POA: Diagnosis not present

## 2021-07-01 DIAGNOSIS — E7800 Pure hypercholesterolemia, unspecified: Secondary | ICD-10-CM | POA: Diagnosis not present

## 2021-07-06 ENCOUNTER — Other Ambulatory Visit: Payer: Self-pay

## 2021-07-06 ENCOUNTER — Telehealth: Payer: Self-pay | Admitting: Cardiology

## 2021-07-06 DIAGNOSIS — Z952 Presence of prosthetic heart valve: Secondary | ICD-10-CM

## 2021-07-06 NOTE — Telephone Encounter (Signed)
Pt wants to know since he had his valve replaced about 10 years ago is there any imaging that can be done to make sure everything is okay.  ?"I've been having ups and downs with my blood pressure. It goes from 116/90 to 150/90. I have been dizzy every once in a while, but not enough to say I've been dizzy. I just wanted to let you all know that." Pt states he is going to be at his appointment on 4/21 with his wife. He just wanted to be prepared.   ?

## 2021-07-06 NOTE — Progress Notes (Signed)
Echo order placed per Dr. Doug Sou request.  ?

## 2021-07-06 NOTE — Telephone Encounter (Signed)
Called pt to let him know Dr. Martinique would like an Echo ordered. He is thankful to get the call back. Order for echo placed.  ?

## 2021-07-06 NOTE — Telephone Encounter (Signed)
Patient called to talk to Dr. Martinique or nurse. Please call back ?

## 2021-07-07 ENCOUNTER — Other Ambulatory Visit: Payer: Self-pay | Admitting: Cardiology

## 2021-07-08 ENCOUNTER — Ambulatory Visit (HOSPITAL_COMMUNITY): Payer: PPO | Attending: Cardiology

## 2021-07-08 DIAGNOSIS — Z952 Presence of prosthetic heart valve: Secondary | ICD-10-CM | POA: Insufficient documentation

## 2021-07-08 LAB — ECHOCARDIOGRAM COMPLETE
AV Mean grad: 9 mmHg
AV Peak grad: 17.6 mmHg
Ao pk vel: 2.1 m/s
Area-P 1/2: 3.08 cm2
S' Lateral: 3.2 cm

## 2021-07-13 DIAGNOSIS — I10 Essential (primary) hypertension: Secondary | ICD-10-CM | POA: Diagnosis not present

## 2021-07-13 DIAGNOSIS — E7800 Pure hypercholesterolemia, unspecified: Secondary | ICD-10-CM | POA: Diagnosis not present

## 2021-07-13 DIAGNOSIS — E039 Hypothyroidism, unspecified: Secondary | ICD-10-CM | POA: Diagnosis not present

## 2021-07-13 DIAGNOSIS — Z125 Encounter for screening for malignant neoplasm of prostate: Secondary | ICD-10-CM | POA: Diagnosis not present
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
## 2021-07-19 ENCOUNTER — Other Ambulatory Visit: Payer: Self-pay | Admitting: Cardiology

## 2021-07-20 DIAGNOSIS — R82998 Other abnormal findings in urine: Secondary | ICD-10-CM | POA: Diagnosis not present

## 2021-07-20 DIAGNOSIS — F411 Generalized anxiety disorder: Secondary | ICD-10-CM | POA: Diagnosis not present

## 2021-07-20 DIAGNOSIS — E039 Hypothyroidism, unspecified: Secondary | ICD-10-CM | POA: Diagnosis not present

## 2021-07-20 DIAGNOSIS — Z1339 Encounter for screening examination for other mental health and behavioral disorders: Secondary | ICD-10-CM | POA: Diagnosis not present

## 2021-07-20 DIAGNOSIS — Z1331 Encounter for screening for depression: Secondary | ICD-10-CM | POA: Diagnosis not present

## 2021-07-20 DIAGNOSIS — I712 Thoracic aortic aneurysm, without rupture, unspecified: Secondary | ICD-10-CM | POA: Diagnosis not present

## 2021-07-20 DIAGNOSIS — D692 Other nonthrombocytopenic purpura: Secondary | ICD-10-CM | POA: Diagnosis not present

## 2021-07-20 DIAGNOSIS — Z952 Presence of prosthetic heart valve: Secondary | ICD-10-CM | POA: Diagnosis not present

## 2021-07-20 DIAGNOSIS — E7800 Pure hypercholesterolemia, unspecified: Secondary | ICD-10-CM | POA: Diagnosis not present

## 2021-07-20 DIAGNOSIS — Z0000 Encounter for general adult medical examination without abnormal findings: Secondary | ICD-10-CM | POA: Diagnosis not present

## 2021-07-20 DIAGNOSIS — F5104 Psychophysiologic insomnia: Secondary | ICD-10-CM | POA: Diagnosis not present

## 2021-07-20 DIAGNOSIS — Z23 Encounter for immunization: Secondary | ICD-10-CM | POA: Diagnosis not present

## 2021-07-20 DIAGNOSIS — I10 Essential (primary) hypertension: Secondary | ICD-10-CM | POA: Diagnosis not present

## 2021-07-22 ENCOUNTER — Other Ambulatory Visit: Payer: Self-pay

## 2021-07-22 ENCOUNTER — Encounter: Payer: Self-pay | Admitting: Cardiology

## 2021-07-22 ENCOUNTER — Ambulatory Visit: Payer: PPO | Admitting: Cardiology

## 2021-07-22 VITALS — BP 124/84 | HR 59 | Ht 70.5 in | Wt 189.0 lb

## 2021-07-22 DIAGNOSIS — E7800 Pure hypercholesterolemia, unspecified: Secondary | ICD-10-CM | POA: Diagnosis not present

## 2021-07-22 DIAGNOSIS — I119 Hypertensive heart disease without heart failure: Secondary | ICD-10-CM | POA: Diagnosis not present

## 2021-07-22 DIAGNOSIS — I7121 Aneurysm of the ascending aorta, without rupture: Secondary | ICD-10-CM | POA: Diagnosis not present

## 2021-07-22 DIAGNOSIS — Z952 Presence of prosthetic heart valve: Secondary | ICD-10-CM | POA: Diagnosis not present

## 2021-07-22 MED ORDER — ALPRAZOLAM 1 MG PO TABS: 1.0000 mg | ORAL_TABLET | Freq: Every evening | ORAL | 3 refills | Status: DC | PRN

## 2021-07-22 MED ORDER — LOSARTAN POTASSIUM 100 MG PO TABS: 100.0000 mg | ORAL_TABLET | Freq: Every day | ORAL | 3 refills | Status: DC

## 2021-07-22 NOTE — Patient Instructions (Signed)
Increase losartan to 100mg daily

## 2021-07-31 DIAGNOSIS — E7800 Pure hypercholesterolemia, unspecified: Secondary | ICD-10-CM | POA: Diagnosis not present

## 2021-07-31 DIAGNOSIS — E039 Hypothyroidism, unspecified: Secondary | ICD-10-CM | POA: Diagnosis not present

## 2021-07-31 DIAGNOSIS — I10 Essential (primary) hypertension: Secondary | ICD-10-CM | POA: Diagnosis not present

## 2021-09-07 ENCOUNTER — Other Ambulatory Visit: Payer: Self-pay | Admitting: Cardiology

## 2021-09-15 DIAGNOSIS — L821 Other seborrheic keratosis: Secondary | ICD-10-CM | POA: Diagnosis not present

## 2021-09-15 DIAGNOSIS — M71341 Other bursal cyst, right hand: Secondary | ICD-10-CM | POA: Diagnosis not present

## 2021-09-15 DIAGNOSIS — D225 Melanocytic nevi of trunk: Secondary | ICD-10-CM | POA: Diagnosis not present

## 2021-09-15 DIAGNOSIS — L814 Other melanin hyperpigmentation: Secondary | ICD-10-CM | POA: Diagnosis not present

## 2021-09-15 DIAGNOSIS — Z85828 Personal history of other malignant neoplasm of skin: Secondary | ICD-10-CM | POA: Diagnosis not present

## 2021-09-15 DIAGNOSIS — D1801 Hemangioma of skin and subcutaneous tissue: Secondary | ICD-10-CM | POA: Diagnosis not present

## 2021-09-15 DIAGNOSIS — D485 Neoplasm of uncertain behavior of skin: Secondary | ICD-10-CM | POA: Diagnosis not present

## 2021-09-15 DIAGNOSIS — L720 Epidermal cyst: Secondary | ICD-10-CM | POA: Diagnosis not present

## 2021-09-15 DIAGNOSIS — B079 Viral wart, unspecified: Secondary | ICD-10-CM | POA: Diagnosis not present

## 2021-09-23 ENCOUNTER — Telehealth: Payer: Self-pay | Admitting: Cardiology

## 2021-09-23 MED ORDER — BUPROPION HCL ER (SR) 200 MG PO TB12: 200.0000 mg | ORAL_TABLET | Freq: Two times a day (BID) | ORAL | 8 refills | Status: DC

## 2022-01-06 DIAGNOSIS — E039 Hypothyroidism, unspecified: Secondary | ICD-10-CM | POA: Diagnosis not present

## 2022-01-06 DIAGNOSIS — I712 Thoracic aortic aneurysm, without rupture, unspecified: Secondary | ICD-10-CM | POA: Diagnosis not present

## 2022-01-06 DIAGNOSIS — F411 Generalized anxiety disorder: Secondary | ICD-10-CM | POA: Diagnosis not present

## 2022-01-06 DIAGNOSIS — F5104 Psychophysiologic insomnia: Secondary | ICD-10-CM | POA: Diagnosis not present

## 2022-01-06 DIAGNOSIS — Z952 Presence of prosthetic heart valve: Secondary | ICD-10-CM | POA: Diagnosis not present

## 2022-01-06 DIAGNOSIS — D692 Other nonthrombocytopenic purpura: Secondary | ICD-10-CM | POA: Diagnosis not present

## 2022-01-06 DIAGNOSIS — M542 Cervicalgia: Secondary | ICD-10-CM | POA: Diagnosis not present

## 2022-01-06 DIAGNOSIS — E7800 Pure hypercholesterolemia, unspecified: Secondary | ICD-10-CM | POA: Diagnosis not present

## 2022-01-06 DIAGNOSIS — I10 Essential (primary) hypertension: Secondary | ICD-10-CM | POA: Diagnosis not present

## 2022-01-06 DIAGNOSIS — N401 Enlarged prostate with lower urinary tract symptoms: Secondary | ICD-10-CM | POA: Diagnosis not present

## 2022-04-06 ENCOUNTER — Other Ambulatory Visit: Payer: Self-pay | Admitting: Cardiology

## 2022-04-20 DIAGNOSIS — N3281 Overactive bladder: Secondary | ICD-10-CM | POA: Diagnosis not present

## 2022-04-20 DIAGNOSIS — N200 Calculus of kidney: Secondary | ICD-10-CM | POA: Diagnosis not present

## 2022-04-20 DIAGNOSIS — N138 Other obstructive and reflux uropathy: Secondary | ICD-10-CM | POA: Diagnosis not present

## 2022-04-20 DIAGNOSIS — N401 Enlarged prostate with lower urinary tract symptoms: Secondary | ICD-10-CM | POA: Diagnosis not present

## 2022-04-20 DIAGNOSIS — N281 Cyst of kidney, acquired: Secondary | ICD-10-CM | POA: Diagnosis not present

## 2022-07-08 ENCOUNTER — Other Ambulatory Visit: Payer: Self-pay | Admitting: Cardiology

## 2022-07-09 ENCOUNTER — Other Ambulatory Visit: Payer: Self-pay | Admitting: Cardiology

## 2022-07-13 ENCOUNTER — Telehealth: Payer: Self-pay | Admitting: Cardiology

## 2022-07-13 NOTE — Telephone Encounter (Signed)
*  STAT* If patient is at the pharmacy, call can be transferred to refill team.   1. Which medications need to be refilled? (please list name of each medication and dose if known)   buPROPion (WELLBUTRIN SR) 200 MG 12 hr tablet    2. Which pharmacy/location (including street and city if local pharmacy) is medication to be sent to? [facility] [address] [place] - [address]   3. Do they need a 30 day or 90 day supply? 90 day   Pt has scheduled appt on 07/20/22

## 2022-07-14 ENCOUNTER — Other Ambulatory Visit: Payer: Self-pay

## 2022-07-14 ENCOUNTER — Telehealth: Payer: Self-pay

## 2022-07-14 ENCOUNTER — Other Ambulatory Visit: Payer: Self-pay | Admitting: Cardiology

## 2022-07-14 MED ORDER — ALPRAZOLAM 1 MG PO TABS: 1.0000 mg | ORAL_TABLET | Freq: Every evening | ORAL | 1 refills | Status: DC | PRN

## 2022-07-14 MED ORDER — ALPRAZOLAM 1 MG PO TABS: 1.0000 mg | ORAL_TABLET | Freq: Every evening | ORAL | 3 refills | Status: DC | PRN

## 2022-07-14 MED ORDER — BUPROPION HCL ER (SR) 200 MG PO TB12: 200.0000 mg | ORAL_TABLET | Freq: Two times a day (BID) | ORAL | 8 refills | Status: DC

## 2022-07-14 NOTE — Telephone Encounter (Signed)
Pt's medication was sent to pt's pharmacy as requested. Confirmation received.  °

## 2022-07-14 NOTE — Telephone Encounter (Signed)
Patient walk in to request refill of Alprazolam.  OK to refill?

## 2022-07-14 NOTE — Telephone Encounter (Signed)
Pt's pharmacy is requesting a refill on alprazolam. Would Dr. [place] like to refill this medication? Please address

## 2022-07-14 NOTE — Telephone Encounter (Signed)
Unable to send RX continues to "print" rather tnhan normal to pharmacy.  Call to pharmacy and pharmacist states she thinks it is due to refills of more than 1.  She ask to retry to send with only 1 refill.  Still will not go through to pharmacy.  She took verbal to refill RX with 3 refills.

## 2022-07-14 NOTE — Telephone Encounter (Signed)
Spoke with patient wife.  She wanted to discuss issues with other appt.s and with medication and he is confused at this time so she is speaking to me.  She wants to discuss why his appt.s for yearly follow up can't be made when he is in the office. I noted that it may be that the schedule is not open that far out and that we have to wait until open to schedule.  She states understanding. Then verified pharmacy for patient refill and advised it would be for 90 days with 3 refills per provider.  She is very Adult nurse.

## 2022-07-18 DIAGNOSIS — Z1212 Encounter for screening for malignant neoplasm of rectum: Secondary | ICD-10-CM | POA: Diagnosis not present

## 2022-07-18 DIAGNOSIS — E7800 Pure hypercholesterolemia, unspecified: Secondary | ICD-10-CM | POA: Diagnosis not present

## 2022-07-18 DIAGNOSIS — E039 Hypothyroidism, unspecified: Secondary | ICD-10-CM | POA: Diagnosis not present

## 2022-07-18 DIAGNOSIS — Z125 Encounter for screening for malignant neoplasm of prostate: Secondary | ICD-10-CM | POA: Diagnosis not present

## 2022-07-18 DIAGNOSIS — I10 Essential (primary) hypertension: Secondary | ICD-10-CM | POA: Diagnosis not present

## 2022-07-20 ENCOUNTER — Ambulatory Visit: Payer: PPO | Attending: Nurse Practitioner | Admitting: Nurse Practitioner

## 2022-07-20 ENCOUNTER — Encounter: Payer: Self-pay | Admitting: Nurse Practitioner

## 2022-07-20 VITALS — BP 118/78 | HR 65 | Ht 72.0 in | Wt 191.6 lb

## 2022-07-20 DIAGNOSIS — I10 Essential (primary) hypertension: Secondary | ICD-10-CM | POA: Diagnosis not present

## 2022-07-20 DIAGNOSIS — Z952 Presence of prosthetic heart valve: Secondary | ICD-10-CM

## 2022-07-20 DIAGNOSIS — E7800 Pure hypercholesterolemia, unspecified: Secondary | ICD-10-CM

## 2022-07-20 DIAGNOSIS — I350 Nonrheumatic aortic (valve) stenosis: Secondary | ICD-10-CM

## 2022-07-20 DIAGNOSIS — I7121 Aneurysm of the ascending aorta, without rupture: Secondary | ICD-10-CM | POA: Diagnosis not present

## 2022-07-20 DIAGNOSIS — R0609 Other forms of dyspnea: Secondary | ICD-10-CM | POA: Diagnosis not present

## 2022-07-20 DIAGNOSIS — R5383 Other fatigue: Secondary | ICD-10-CM

## 2022-07-20 MED ORDER — METOPROLOL TARTRATE 50 MG PO TABS: ORAL_TABLET | ORAL | 0 refills | Status: DC
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
## 2022-07-20 NOTE — Patient Instructions (Signed)
Medication Instructions:  Metoprolol Tartrate 50 mg take 1 tablet 2 hours prior to Cardiac CT.  *If you need a refill on your cardiac medications before your next appointment, please call your pharmacy*   Lab work  Your physician recommends that you return for lab work in 1 week before Cardiac CT. BMET  If you have labs (blood work) drawn today and your tests are completely normal, you will receive your results only by: MyChart Message (if you have MyChart) OR A paper copy in the mail If you have any lab test that is abnormal or we need to change your treatment, we will call you to review the results.   Testing/Procedures: Your physician has requested that you have an echocardiogram. Echocardiography is a painless test that uses sound waves to create images of your heart. It provides your doctor with information about the size and shape of your heart and how well your heart's chambers and valves are working. This procedure takes approximately one hour. There are no restrictions for this procedure. Please do NOT wear cologne, perfume, aftershave, or lotions (deodorant is allowed). Please arrive 15 minutes prior to your appointment time.    Your cardiac CT will be scheduled at one of the below locations:   [facility] [address] [place], [place] [address] [phone]  OR  [facility] [address] [place], [place] [address] [phone]  OR   [facility] [address] [place], [place] [address] [phone]  If scheduled at [facility], please arrive at the [facility] (Entrance C2) of [facility] 30 minutes prior to test start time. You can use the FREE valet parking offered at entrance C (encouraged to control the heart rate for the test)  Proceed to the [facility] Radiology Department (first floor) to check-in and test prep.  All radiology  patients and guests should use entrance C2 at [facility], accessed from [facility], even though the hospital's physical address listed is [address]    If scheduled at [facility] or [facility], please arrive 15 mins early for check-in and test prep.   Please follow these instructions carefully (unless otherwise directed):  Hold all erectile dysfunction medications at least 3 days (72 hrs) prior to test. (Ie viagra, cialis, sildenafil, tadalafil, etc) We will administer nitroglycerin during this exam.   On the Night Before the Test: Be sure to Drink plenty of water. Do not consume any caffeinated/decaffeinated beverages or chocolate 12 hours prior to your test. Do not take any antihistamines 12 hours prior to your test. If the patient has contrast allergy: Patient will need a prescription for Prednisone and very clear instructions (as follows): Prednisone 50 mg - take 13 hours prior to test Take another Prednisone 50 mg 7 hours prior to test Take another Prednisone 50 mg 1 hour prior to test Take Benadryl 50 mg 1 hour prior to test Patient must complete all four doses of above prophylactic medications. Patient will need a ride after test due to Benadryl.  On the Day of the Test: Drink plenty of water until 1 hour prior to the test. Do not eat any food 1 hour prior to test. You may take your regular medications prior to the test.  Take metoprolol (Lopressor) two hours prior to test. If you take Furosemide/Hydrochlorothiazide/Spironolactone, please HOLD on the morning of the test. FEMALES- please  wear underwire-free bra if available, avoid dresses & tight clothing   *For Clinical Staff only. Please instruct patient the following:* Heart Rate Medication Recommendations for Cardiac CT  Resting HR < 50 bpm  No medication  Resting HR 50-60 bpm and BP >110/50 mmHG   Consider Metoprolol tartrate 25 mg PO  90-120 min prior to scan  Resting HR 60-65 bpm and BP >110/50 mmHG  Metoprolol tartrate 50 mg PO 90-120 minutes prior to scan   Resting HR > 65 bpm and BP >110/50 mmHG  Metoprolol tartrate 100 mg PO 90-120 minutes prior to scan  Consider Ivabradine 10-15 mg PO or a calcium channel blocker for resting HR >60 bpm and contraindication to metoprolol tartrate  Consider Ivabradine 10-15 mg PO in combination with metoprolol tartrate for HR >80 bpm         After the Test: Drink plenty of water. After receiving IV contrast, you may experience a mild flushed feeling. This is normal. On occasion, you may experience a mild rash up to 24 hours after the test. This is not dangerous. If this occurs, you can take Benadryl 25 mg and increase your fluid intake. If you experience trouble breathing, this can be serious. If it is severe call 911 IMMEDIATELY. If it is mild, please call our office. If you take any of these medications: Glipizide/Metformin, Avandament, Glucavance, please do not take 48 hours after completing test unless otherwise instructed.  We will call to schedule your test 2-4 weeks out understanding that some insurance companies will need an authorization prior to the service being performed.   For non-scheduling related questions, please contact the cardiac imaging nurse navigator should you have any questions/concerns: Rockwell Alexandria, Cardiac Imaging Nurse Navigator Larey Brick, Cardiac Imaging Nurse Navigator [facility] Direct Office Dial: [phone]   For scheduling needs, including cancellations and rescheduling, please call [place], [phone].     Follow-Up: At [facility], you and your health needs are our priority.  As part of our continuing mission to provide you with exceptional heart care, we have created designated Provider Care Teams.  These Care Teams include your primary Cardiologist (physician) and Advanced Practice Providers  (APPs -  Physician Assistants and Nurse Practitioners) who all work together to provide you with the care you need, when you need it.  We recommend signing up for the patient portal called "MyChart".  Sign up information is provided on this After Visit Summary.  MyChart is used to connect with patients for Virtual Visits (Telemedicine).  Patients are able to view lab/test results, encounter notes, upcoming appointments, etc.  Non-urgent messages can be sent to your provider as well.   To learn more about what you can do with MyChart, go to [email or web address].    Your next appointment:   2 month(s)  Provider:   Bernadene Person, NP        Other Instructions

## 2022-07-21 MED ORDER — ALPRAZOLAM 1 MG PO TABS: ORAL_TABLET | ORAL | 1 refills | Status: DC

## 2022-07-22 LAB — BASIC METABOLIC PANEL
BUN/Creatinine Ratio: 16 (ref 10–24)
BUN: 19 mg/dL (ref 8–27)
CO2: 22 mmol/L (ref 20–29)
Calcium: 9.6 mg/dL (ref 8.6–10.2)
Chloride: 105 mmol/L (ref 96–106)
Creatinine, Ser: 1.17 mg/dL (ref 0.76–1.27)
Glucose: 96 mg/dL (ref 70–99)
Potassium: 5.1 mmol/L (ref 3.5–5.2)
Sodium: 145 mmol/L — ABNORMAL HIGH (ref 134–144)
eGFR: 64 mL/min/{1.73_m2} (ref >59)

## 2022-07-25 ENCOUNTER — Telehealth: Payer: Self-pay

## 2022-07-25 DIAGNOSIS — R310 Gross hematuria: Secondary | ICD-10-CM | POA: Diagnosis not present

## 2022-07-25 DIAGNOSIS — Z952 Presence of prosthetic heart valve: Secondary | ICD-10-CM | POA: Diagnosis not present

## 2022-07-25 DIAGNOSIS — M542 Cervicalgia: Secondary | ICD-10-CM | POA: Diagnosis not present

## 2022-07-25 DIAGNOSIS — Z1339 Encounter for screening examination for other mental health and behavioral disorders: Secondary | ICD-10-CM | POA: Diagnosis not present

## 2022-07-25 DIAGNOSIS — E039 Hypothyroidism, unspecified: Secondary | ICD-10-CM | POA: Diagnosis not present

## 2022-07-25 DIAGNOSIS — Z0000 Encounter for general adult medical examination without abnormal findings: Secondary | ICD-10-CM | POA: Diagnosis not present

## 2022-07-25 DIAGNOSIS — F5104 Psychophysiologic insomnia: Secondary | ICD-10-CM | POA: Diagnosis not present

## 2022-07-25 DIAGNOSIS — I10 Essential (primary) hypertension: Secondary | ICD-10-CM | POA: Diagnosis not present

## 2022-07-25 DIAGNOSIS — N390 Urinary tract infection, site not specified: Secondary | ICD-10-CM | POA: Diagnosis not present

## 2022-07-25 DIAGNOSIS — I712 Thoracic aortic aneurysm, without rupture, unspecified: Secondary | ICD-10-CM | POA: Diagnosis not present

## 2022-07-25 DIAGNOSIS — F411 Generalized anxiety disorder: Secondary | ICD-10-CM | POA: Diagnosis not present

## 2022-07-25 DIAGNOSIS — Z1331 Encounter for screening for depression: Secondary | ICD-10-CM | POA: Diagnosis not present

## 2022-07-25 DIAGNOSIS — E7800 Pure hypercholesterolemia, unspecified: Secondary | ICD-10-CM | POA: Diagnosis not present

## 2022-07-25 NOTE — Telephone Encounter (Signed)
Spoke with pt. Pt was notified of lab results. Pt will continue his current medication and f/u as planned.  

## 2022-07-27 ENCOUNTER — Telehealth (HOSPITAL_COMMUNITY): Payer: Self-pay | Admitting: Emergency Medicine

## 2022-07-27 NOTE — Telephone Encounter (Signed)
Reaching out to patient to offer assistance regarding upcoming cardiac imaging study; pt verbalizes understanding of appt date/time, parking situation and where to check in, pre-test NPO status and medications ordered, and verified current allergies; name and call back number provided for further questions should they arise Zanobia Griebel RN Navigator Cardiac Imaging [facility] [phone] office [phone] cell 

## 2022-07-28 ENCOUNTER — Ambulatory Visit (HOSPITAL_COMMUNITY)
Admission: RE | Admit: 2022-07-28 | Discharge: 2022-07-28 | Disposition: A | Discharge status: Home / Self Care | Payer: PPO | Source: Ambulatory Visit | Attending: Nurse Practitioner | Admitting: Nurse Practitioner

## 2022-07-28 DIAGNOSIS — I2510 Atherosclerotic heart disease of native coronary artery without angina pectoris: Secondary | ICD-10-CM

## 2022-07-28 DIAGNOSIS — Z953 Presence of xenogenic heart valve: Secondary | ICD-10-CM | POA: Diagnosis not present

## 2022-07-28 DIAGNOSIS — I700 Atherosclerosis of aorta: Secondary | ICD-10-CM | POA: Diagnosis not present

## 2022-07-28 DIAGNOSIS — R5383 Other fatigue: Secondary | ICD-10-CM | POA: Insufficient documentation

## 2022-07-28 DIAGNOSIS — R0609 Other forms of dyspnea: Secondary | ICD-10-CM | POA: Diagnosis not present

## 2022-07-28 MED ORDER — IOHEXOL 350 MG/ML SOLN
100.0000 mL | Freq: Once | INTRAVENOUS | Status: AC | PRN
  Administered 2022-07-28: 100 mL via INTRAVENOUS

## 2022-07-28 MED ORDER — NITROGLYCERIN 0.4 MG SL SUBL
SUBLINGUAL_TABLET | SUBLINGUAL | Status: AC
  Filled 2022-07-28: qty 2

## 2022-07-28 MED ORDER — NITROGLYCERIN 0.4 MG SL SUBL
0.8000 mg | SUBLINGUAL_TABLET | Freq: Once | SUBLINGUAL | Status: AC
  Administered 2022-07-28: 0.8 mg via SUBLINGUAL

## 2022-08-04 ENCOUNTER — Telehealth: Payer: Self-pay

## 2022-08-04 DIAGNOSIS — I10 Essential (primary) hypertension: Secondary | ICD-10-CM

## 2022-08-04 DIAGNOSIS — Z79899 Other long term (current) drug therapy: Secondary | ICD-10-CM

## 2022-08-04 NOTE — Telephone Encounter (Signed)
Spoke with pt and pts spouse. Pt was notified of CT results. Pt will repeat labs next week. Orders placed. Pt voiced understand and will continue his current medication.

## 2022-08-21 ENCOUNTER — Ambulatory Visit (HOSPITAL_COMMUNITY): Payer: PPO | Attending: Cardiovascular Disease

## 2022-08-21 DIAGNOSIS — R5383 Other fatigue: Secondary | ICD-10-CM | POA: Diagnosis not present

## 2022-08-21 DIAGNOSIS — I340 Nonrheumatic mitral (valve) insufficiency: Secondary | ICD-10-CM | POA: Diagnosis not present

## 2022-08-21 DIAGNOSIS — I361 Nonrheumatic tricuspid (valve) insufficiency: Secondary | ICD-10-CM

## 2022-08-21 DIAGNOSIS — I350 Nonrheumatic aortic (valve) stenosis: Secondary | ICD-10-CM | POA: Insufficient documentation

## 2022-08-21 LAB — ECHOCARDIOGRAM COMPLETE
AR max vel: 1.26 cm2
AV Area VTI: 1.42 cm2
AV Area mean vel: 1.45 cm2
AV Mean grad: 9 mmHg
AV Peak grad: 19.1 mmHg
Ao pk vel: 2.19 m/s
Area-P 1/2: 3.4 cm2
S' Lateral: 3.8 cm

## 2022-08-25 ENCOUNTER — Telehealth: Payer: Self-pay | Admitting: Cardiology

## 2022-08-25 ENCOUNTER — Telehealth: Payer: Self-pay

## 2022-08-25 ENCOUNTER — Other Ambulatory Visit: Payer: Self-pay | Admitting: Cardiology

## 2022-08-25 ENCOUNTER — Other Ambulatory Visit: Payer: Self-pay

## 2022-08-25 DIAGNOSIS — I350 Nonrheumatic aortic (valve) stenosis: Secondary | ICD-10-CM

## 2022-08-25 DIAGNOSIS — R0609 Other forms of dyspnea: Secondary | ICD-10-CM

## 2022-08-25 DIAGNOSIS — Z01812 Encounter for preprocedural laboratory examination: Secondary | ICD-10-CM

## 2022-08-25 DIAGNOSIS — I340 Nonrheumatic mitral (valve) insufficiency: Secondary | ICD-10-CM

## 2022-08-25 NOTE — Telephone Encounter (Signed)
Spoke to patient and wife echo results given.

## 2022-08-25 NOTE — Telephone Encounter (Signed)
Patient returning call for echo results. 

## 2022-08-25 NOTE — Telephone Encounter (Signed)
   Dear [name] [name]  You are scheduled for a TEE (Transesophageal Echocardiogram) on Thursday, May 30 with Dr. Anne Fu.  Please arrive at the [facility] (Main Entrance A) at [facility]: [address] [place], [place] [address] at 9:00 AM (This time is 1 hour(s) before your procedure to ensure your preparation). Free valet parking service is available. You will check in at ADMITTING. The support person will be asked to wait in the waiting room.  It is OK to have someone drop you off and come back when you are ready to be discharged.     DIET:  Nothing to eat or drink after midnight except a sip of water with medications (see medication instructions below)  MEDICATION INSTRUCTIONS: !!IF ANY NEW MEDICATIONS ARE STARTED AFTER TODAY, PLEASE NOTIFY YOUR PROVIDER AS SOON AS POSSIBLE!!    LABS: bmet,cbc,pt to be done Tues 5/28 at Dr.Jordan's office [facility] [employer]:  For your safety, and to allow [place] to monitor your vital signs accurately during the surgery/procedure we request: If you have artificial nails, gel coating, SNS etc, please have those removed prior to your surgery/procedure. Not having the nail coverings /polish removed may result in cancellation or delay of your surgery/procedure.  You must have a responsible person to drive you home and stay in the waiting area during your procedure. Failure to do so could result in cancellation.  Bring your insurance cards.  *Special Note: Every effort is made to have your procedure done on time. Occasionally there are emergencies that occur at the hospital that may cause delays. Please be patient if a delay does occur.

## 2022-08-28 ENCOUNTER — Other Ambulatory Visit: Payer: Self-pay | Admitting: Cardiology

## 2022-08-29 DIAGNOSIS — I340 Nonrheumatic mitral (valve) insufficiency: Secondary | ICD-10-CM | POA: Diagnosis not present

## 2022-08-29 DIAGNOSIS — Z01812 Encounter for preprocedural laboratory examination: Secondary | ICD-10-CM | POA: Diagnosis not present

## 2022-08-29 DIAGNOSIS — R0609 Other forms of dyspnea: Secondary | ICD-10-CM | POA: Diagnosis not present

## 2022-08-30 LAB — BASIC METABOLIC PANEL
BUN/Creatinine Ratio: 19 (ref 10–24)
BUN: 22 mg/dL (ref 8–27)
CO2: 25 mmol/L (ref 20–29)
Calcium: 9.4 mg/dL (ref 8.6–10.2)
Chloride: 103 mmol/L (ref 96–106)
Creatinine, Ser: 1.18 mg/dL (ref 0.76–1.27)
Glucose: 107 mg/dL — ABNORMAL HIGH (ref 70–99)
Potassium: 4.7 mmol/L (ref 3.5–5.2)
Sodium: 140 mmol/L (ref 134–144)
eGFR: 63 mL/min/{1.73_m2} (ref >59)

## 2022-08-30 LAB — CBC WITH DIFFERENTIAL/PLATELET
Basophils Absolute: 0 10*3/uL (ref 0.0–0.2)
Basos: 0 %
EOS (ABSOLUTE): 0.1 10*3/uL (ref 0.0–0.4)
Eos: 2 %
Hematocrit: 40.4 % (ref 37.5–51.0)
Hemoglobin: 13.7 g/dL (ref 13.0–17.7)
Immature Grans (Abs): 0 10*3/uL (ref 0.0–0.1)
Immature Granulocytes: 0 %
Lymphocytes Absolute: 2 10*3/uL (ref 0.7–3.1)
Lymphs: 29 %
MCH: 33.1 pg — ABNORMAL HIGH (ref 26.6–33.0)
MCHC: 33.9 g/dL (ref 31.5–35.7)
MCV: 98 fL — ABNORMAL HIGH (ref 79–97)
Monocytes Absolute: 0.8 10*3/uL (ref 0.1–0.9)
Monocytes: 11 %
Neutrophils Absolute: 4.2 10*3/uL (ref 1.4–7.0)
Neutrophils: 58 %
Platelets: 175 10*3/uL (ref 150–450)
RBC: 4.14 x10E6/uL (ref 4.14–5.80)
RDW: 12.5 % (ref 11.6–15.4)
WBC: 7.2 10*3/uL (ref 3.4–10.8)

## 2022-08-30 LAB — PT AND PTT
INR: 1 (ref 0.9–1.2)
Prothrombin Time: 10.9 s (ref 9.1–12.0)
aPTT: 28 s (ref 24–33)

## 2022-08-30 NOTE — Pre-Procedure Instructions (Signed)
Spoke with patient regarding TEE instructions for tomorrow.  NPO after midnight,  Needs responsible adult to drive you home and stay with you for 24 hours.

## 2022-08-31 ENCOUNTER — Ambulatory Visit (HOSPITAL_COMMUNITY): Payer: PPO | Admitting: Anesthesiology

## 2022-08-31 ENCOUNTER — Ambulatory Visit (HOSPITAL_BASED_OUTPATIENT_CLINIC_OR_DEPARTMENT_OTHER): Payer: PPO | Admitting: Anesthesiology

## 2022-08-31 ENCOUNTER — Ambulatory Visit (HOSPITAL_BASED_OUTPATIENT_CLINIC_OR_DEPARTMENT_OTHER): Payer: PPO

## 2022-08-31 ENCOUNTER — Ambulatory Visit (HOSPITAL_COMMUNITY)
Admission: RE | Admit: 2022-08-31 | Discharge: 2022-08-31 | Disposition: A | Discharge status: Home / Self Care | Payer: PPO | Attending: Cardiology | Admitting: Cardiology

## 2022-08-31 ENCOUNTER — Other Ambulatory Visit: Payer: Self-pay

## 2022-08-31 ENCOUNTER — Encounter (HOSPITAL_COMMUNITY): Payer: Self-pay | Admitting: Cardiology

## 2022-08-31 ENCOUNTER — Encounter (HOSPITAL_COMMUNITY)
Admission: RE | Disposition: A | Discharge status: Home / Self Care | Payer: Self-pay | Source: Home / Self Care | Attending: Cardiology

## 2022-08-31 DIAGNOSIS — I081 Rheumatic disorders of both mitral and tricuspid valves: Secondary | ICD-10-CM

## 2022-08-31 DIAGNOSIS — I10 Essential (primary) hypertension: Secondary | ICD-10-CM | POA: Diagnosis not present

## 2022-08-31 DIAGNOSIS — Z953 Presence of xenogenic heart valve: Secondary | ICD-10-CM | POA: Diagnosis not present

## 2022-08-31 DIAGNOSIS — Z79899 Other long term (current) drug therapy: Secondary | ICD-10-CM | POA: Insufficient documentation

## 2022-08-31 DIAGNOSIS — I340 Nonrheumatic mitral (valve) insufficiency: Secondary | ICD-10-CM | POA: Diagnosis not present

## 2022-08-31 DIAGNOSIS — E785 Hyperlipidemia, unspecified: Secondary | ICD-10-CM | POA: Diagnosis not present

## 2022-08-31 DIAGNOSIS — R0609 Other forms of dyspnea: Secondary | ICD-10-CM | POA: Diagnosis not present

## 2022-08-31 DIAGNOSIS — R5383 Other fatigue: Secondary | ICD-10-CM | POA: Insufficient documentation

## 2022-08-31 DIAGNOSIS — I119 Hypertensive heart disease without heart failure: Secondary | ICD-10-CM | POA: Diagnosis not present

## 2022-08-31 DIAGNOSIS — Z87891 Personal history of nicotine dependence: Secondary | ICD-10-CM | POA: Diagnosis not present

## 2022-08-31 HISTORY — PX: TEE WITHOUT CARDIOVERSION: SHX5443

## 2022-08-31 LAB — ECHO TEE

## 2022-08-31 SURGERY — ECHOCARDIOGRAM, TRANSESOPHAGEAL
Anesthesia: General

## 2022-08-31 MED ORDER — PROPOFOL 500 MG/50ML IV EMUL
INTRAVENOUS | Status: DC | PRN
  Administered 2022-08-31: 150 ug/kg/min via INTRAVENOUS

## 2022-08-31 MED ORDER — EPHEDRINE SULFATE-NACL 50-0.9 MG/10ML-% IV SOSY
PREFILLED_SYRINGE | INTRAVENOUS | Status: DC | PRN
  Administered 2022-08-31: 10 mg via INTRAVENOUS
  Administered 2022-08-31: 5 mg via INTRAVENOUS

## 2022-08-31 MED ORDER — LIDOCAINE 2% (20 MG/ML) 5 ML SYRINGE
INTRAMUSCULAR | Status: DC | PRN
  Administered 2022-08-31: 20 mg via INTRAVENOUS
  Administered 2022-08-31: 80 mg via INTRAVENOUS

## 2022-08-31 MED ORDER — SODIUM CHLORIDE 0.9 % IV SOLN: INTRAVENOUS | Status: DC

## 2022-08-31 MED ORDER — PROPOFOL 10 MG/ML IV BOLUS
INTRAVENOUS | Status: DC | PRN
  Administered 2022-08-31 (×2): 25 mg via INTRAVENOUS

## 2022-08-31 MED ORDER — PHENYLEPHRINE 80 MCG/ML (10ML) SYRINGE FOR IV PUSH (FOR BLOOD PRESSURE SUPPORT)
PREFILLED_SYRINGE | INTRAVENOUS | Status: DC | PRN
  Administered 2022-08-31: 150 ug via INTRAVENOUS
  Administered 2022-08-31 (×2): 80 ug via INTRAVENOUS

## 2022-08-31 NOTE — CV Procedure (Signed)
   Transesophageal Echocardiogram  Indications: New increasing mitral regurgitation  Time out performed  During this procedure the patient was administered propofol under anesthesiology supervision to achieve and maintain moderate sedation.  The patient's heart rate, blood pressure, and oxygen saturation are monitored continuously during the procedure.   Findings:  Left Ventricle: Normal EF 60 to 65%  Mitral Valve: Mildly restricted posterior mitral valve leaflet with degeneration.  There is eccentric mitral regurgitation, moderate in severity.  There is no pulmonary vein flow reversal.  Aortic Valve: Bentall procedure, bioprosthetic functioning normally.  Tricuspid Valve: Mild tricuspid regurgitation  Left Atrium: Normal, no left atrial appendage thrombus  Right Atrium: Normal  Intraatrial septum: Normal  Impression: Moderate appearing eccentric mitral regurgitation.  Donato Schultz, MD

## 2022-08-31 NOTE — Anesthesia Preprocedure Evaluation (Signed)
Anesthesia Evaluation  Patient identified by MRN, date of birth, ID band Patient awake    Reviewed: Allergy & Precautions, H&P , NPO status , Patient's Chart, lab work & pertinent test results  Airway Mallampati: II  TM Distance: >3 FB Neck ROM: Full    Dental no notable dental hx.    Pulmonary neg pulmonary ROS, former smoker   Pulmonary exam normal breath sounds clear to auscultation       Cardiovascular hypertension, Pt. on medications + Valvular Problems/Murmurs MR  Rhythm:Regular Rate:Normal + Systolic murmurs  Left Ventricle: Left ventricular ejection fraction, by estimation, is 60  to 65%. The left ventricle has normal function. The left ventricle has no  regional wall motion abnormalities. The left ventricular internal cavity  size was normal in size. There is   mild concentric left ventricular hypertrophy. Left ventricular diastolic  parameters are consistent with Grade II diastolic dysfunction  (pseudonormalization). Indeterminate filling pressures.   Right Ventricle: The right ventricular size is normal. No increase in  right ventricular wall thickness. Right ventricular systolic function is  normal.   Left Atrium: Left atrial size was severely dilated.   Right Atrium: Right atrial size was normal in size.   Pericardium: There is no evidence of pericardial effusion.   Mitral Valve: Posterior leaflet restriction. Consider TEE to better assess  mitral regurgitation severity. The mitral valve is normal in structure.  Moderate to severe mitral valve regurgitation. No evidence of mitral valve  stenosis.   Tricuspid Valve: The tricuspid valve is normal in structure. Tricuspid  valve regurgitation is mild . No evidence of tricuspid stenosis.   Aortic Valve: The aortic valve has been repaired/replaced. Aortic valve  regurgitation is not visualized. No aortic stenosis is present. Aortic  valve mean gradient measures  9.0 mmHg. Aortic valve peak gradient measures  19.1 mmHg. Aortic valve area, by VTI   measures 1.42 cm. There is a 25 mm Edwards Perimount pericardial valve  valve present in the aortic position.   Pulmonic Valve: The pulmonic valve was normal in structure. Pulmonic valve  regurgitation is not visualized. No evidence of pulmonic stenosis.   Aorta: 30 mm Hemashield graft. The aortic root/ascending aorta has been  repaired/replaced.     Neuro/Psych negative neurological ROS  negative psych ROS   GI/Hepatic negative GI ROS, Neg liver ROS,,,  Endo/Other  negative endocrine ROS    Renal/GU negative Renal ROS  negative genitourinary   Musculoskeletal negative musculoskeletal ROS (+)    Abdominal   Peds negative pediatric ROS (+)  Hematology negative hematology ROS (+)   Anesthesia Other Findings   Reproductive/Obstetrics negative OB ROS                             Anesthesia Physical Anesthesia Plan  ASA: 3  Anesthesia Plan: General   Post-op Pain Management: Minimal or no pain anticipated   Induction: Intravenous  PONV Risk Score and Plan: 2 and Treatment may vary due to age or medical condition  Airway Management Planned: Nasal Cannula  Additional Equipment:   Intra-op Plan:   Post-operative Plan:   Informed Consent: I have reviewed the patients History and Physical, chart, labs and discussed the procedure including the risks, benefits and alternatives for the proposed anesthesia with the patient or authorized representative who has indicated his/her understanding and acceptance.     Dental advisory given  Plan Discussed with: CRNA and Surgeon  Anesthesia Plan Comments:  Anesthesia Quick Evaluation

## 2022-08-31 NOTE — Discharge Instructions (Signed)

## 2022-08-31 NOTE — Transfer of Care (Signed)
Immediate Anesthesia Transfer of Care Note  Patient: [name] [name]  Procedure(s) Performed: TRANSESOPHAGEAL ECHOCARDIOGRAM  Patient Location: PACU and Cath Lab  Anesthesia Type:MAC  Level of Consciousness: drowsy and patient cooperative  Airway & Oxygen Therapy: Patient Spontanous Breathing and Patient connected to nasal cannula oxygen  Post-op Assessment: Report given to RN and Post -op Vital signs reviewed and stable  Post vital signs: Reviewed  Last Vitals:  Vitals Value Taken Time  BP    Temp    Pulse    Resp    SpO2      Last Pain:  Vitals:   08/31/22 0924  TempSrc:   PainSc: 0-No pain         Complications: No notable events documented.

## 2022-08-31 NOTE — Anesthesia Postprocedure Evaluation (Signed)
Anesthesia Post Note  Patient: [name] [name]  Procedure(s) Performed: TRANSESOPHAGEAL ECHOCARDIOGRAM     Patient location during evaluation: PACU Anesthesia Type: MAC Level of consciousness: awake and alert Pain management: pain level controlled Vital Signs Assessment: post-procedure vital signs reviewed and stable Respiratory status: spontaneous breathing, nonlabored ventilation, respiratory function stable and patient connected to nasal cannula oxygen Cardiovascular status: stable and blood pressure returned to baseline Postop Assessment: no apparent nausea or vomiting Anesthetic complications: no  No notable events documented.  Last Vitals:  Vitals:   08/31/22 1108 08/31/22 1110  BP: 133/64   Pulse: (!) 55 (!) 56  Resp: 16 17  Temp:    SpO2: 99% 97%    Last Pain:  Vitals:   08/31/22 1106  TempSrc: Temporal  PainSc: 0-No pain                 Adrine Hayworth S

## 2022-08-31 NOTE — Anesthesia Procedure Notes (Signed)
Procedure Name: MAC Date/Time: 08/31/2022 11:45 AM  Performed by: Adria Dill, CRNAPre-anesthesia Checklist: Patient identified, Emergency Drugs available, Suction available and Patient being monitored Patient Re-evaluated:Patient Re-evaluated prior to induction Oxygen Delivery Method: Nasal cannula Preoxygenation: Pre-oxygenation with 100% oxygen Induction Type: IV induction Airway Equipment and Method: Bite block Placement Confirmation: positive ETCO2 and breath sounds checked- equal and bilateral Dental Injury: Teeth and Oropharynx as per pre-operative assessment
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
## 2022-09-01 ENCOUNTER — Encounter (HOSPITAL_COMMUNITY): Payer: Self-pay | Admitting: Cardiology

## 2022-09-13 ENCOUNTER — Ambulatory Visit: Payer: PPO | Admitting: Nurse Practitioner

## 2022-09-20 DIAGNOSIS — C44519 Basal cell carcinoma of skin of other part of trunk: Secondary | ICD-10-CM | POA: Diagnosis not present

## 2022-09-20 DIAGNOSIS — D225 Melanocytic nevi of trunk: Secondary | ICD-10-CM | POA: Diagnosis not present

## 2022-09-20 DIAGNOSIS — L570 Actinic keratosis: Secondary | ICD-10-CM | POA: Diagnosis not present

## 2022-09-20 DIAGNOSIS — L720 Epidermal cyst: Secondary | ICD-10-CM | POA: Diagnosis not present

## 2022-09-20 DIAGNOSIS — Z85828 Personal history of other malignant neoplasm of skin: Secondary | ICD-10-CM | POA: Diagnosis not present

## 2022-09-20 DIAGNOSIS — L814 Other melanin hyperpigmentation: Secondary | ICD-10-CM | POA: Diagnosis not present

## 2022-09-20 DIAGNOSIS — C44311 Basal cell carcinoma of skin of nose: Secondary | ICD-10-CM | POA: Diagnosis not present

## 2022-09-20 DIAGNOSIS — L821 Other seborrheic keratosis: Secondary | ICD-10-CM | POA: Diagnosis not present

## 2022-09-25 ENCOUNTER — Telehealth: Payer: Self-pay | Admitting: Cardiology

## 2022-09-25 NOTE — Telephone Encounter (Signed)
Pt c/o medication issue:  1. Name of Medication:   buPROPion (WELLBUTRIN SR) 200 MG 12 hr tablet  metoprolol tartrate (LOPRESSOR) 50 MG tablet ALPRAZolam (XANAX) 1 MG tablet   2. How are you currently taking this medication (dosage and times per day)?   As prescribed  3. Are you having a reaction (difficulty breathing--STAT)?     4. What is your medication issue?   Wife stated patient will need refills of these medications to last through August as will be going out of town.  Wife wants call back to confirm which metoprolol medication patient should be taking.

## 2022-09-25 NOTE — Telephone Encounter (Signed)
Spoke to patient's wife she stated they are going on vacation next week and will need refills on medication listed below.Advised I will check with Dr.Jordan tomorrow and call her back.  Spoke to Dr.Jordan ok to refill medications listed below.Wife was notified refills sent to pharmacy.

## 2022-09-26 ENCOUNTER — Ambulatory Visit: Payer: PPO | Admitting: Nurse Practitioner

## 2022-09-26 MED ORDER — ALPRAZOLAM 1 MG PO TABS: ORAL_TABLET | ORAL | 3 refills | Status: DC

## 2022-09-26 MED ORDER — METOPROLOL SUCCINATE ER 25 MG PO TB24: 25.0000 mg | ORAL_TABLET | Freq: Every day | ORAL | 3 refills | Status: DC

## 2022-09-26 MED ORDER — BUPROPION HCL ER (SR) 200 MG PO TB12: 200.0000 mg | ORAL_TABLET | Freq: Two times a day (BID) | ORAL | 3 refills | Status: DC

## 2022-09-26 MED ORDER — ALPRAZOLAM 1 MG PO TABS: ORAL_TABLET | ORAL | 1 refills | Status: DC

## 2022-09-27 ENCOUNTER — Other Ambulatory Visit: Payer: Self-pay | Admitting: Cardiology

## 2022-11-07 DIAGNOSIS — C44311 Basal cell carcinoma of skin of nose: Secondary | ICD-10-CM | POA: Diagnosis not present

## 2022-11-07 DIAGNOSIS — Z85828 Personal history of other malignant neoplasm of skin: Secondary | ICD-10-CM | POA: Diagnosis not present

## 2023-01-09 DIAGNOSIS — D692 Other nonthrombocytopenic purpura: Secondary | ICD-10-CM | POA: Diagnosis not present

## 2023-01-09 DIAGNOSIS — I712 Thoracic aortic aneurysm, without rupture, unspecified: Secondary | ICD-10-CM | POA: Diagnosis not present

## 2023-01-09 DIAGNOSIS — I10 Essential (primary) hypertension: Secondary | ICD-10-CM | POA: Diagnosis not present

## 2023-01-09 DIAGNOSIS — E7800 Pure hypercholesterolemia, unspecified: Secondary | ICD-10-CM | POA: Diagnosis not present

## 2023-01-09 DIAGNOSIS — Z952 Presence of prosthetic heart valve: Secondary | ICD-10-CM | POA: Diagnosis not present

## 2023-01-09 DIAGNOSIS — N401 Enlarged prostate with lower urinary tract symptoms: Secondary | ICD-10-CM | POA: Diagnosis not present

## 2023-01-09 DIAGNOSIS — F411 Generalized anxiety disorder: Secondary | ICD-10-CM | POA: Diagnosis not present

## 2023-01-09 DIAGNOSIS — E039 Hypothyroidism, unspecified: Secondary | ICD-10-CM | POA: Diagnosis not present

## 2023-01-09 DIAGNOSIS — F5104 Psychophysiologic insomnia: Secondary | ICD-10-CM | POA: Diagnosis not present

## 2023-01-25 ENCOUNTER — Telehealth: Payer: Self-pay | Admitting: Cardiology

## 2023-01-25 DIAGNOSIS — M79661 Pain in right lower leg: Secondary | ICD-10-CM

## 2023-01-25 NOTE — Telephone Encounter (Signed)
Pt called in stating he has vein that was put in leg when he had bypass surgery years ago and it is now swelling badly, he wants to know what he should do

## 2023-01-25 NOTE — Telephone Encounter (Signed)
Called patient left message on personal voice mail to call back.  Spoke to patient he stated he woke up yesterday with swelling in right lower leg where vein was taken out years ago when he had heart surgery.Stated right upper and lower leg swollen and painful to touch.He does notice some redness. Spoke to Dr.Jordan he advised to have a venous doppler to check for a DVT. Spoke with our scheduler no openings tomorrow.She will check with supervisor in morning to see when you can be worked in. Advised someone will call back in morning with appointment.Advised to keep appointment with Bernadene Person NP Mon 10/28 at 2:45 pm.

## 2023-01-26 ENCOUNTER — Other Ambulatory Visit: Payer: Self-pay | Admitting: Cardiology

## 2023-01-26 ENCOUNTER — Ambulatory Visit (HOSPITAL_BASED_OUTPATIENT_CLINIC_OR_DEPARTMENT_OTHER): Payer: PPO

## 2023-01-26 ENCOUNTER — Other Ambulatory Visit (HOSPITAL_COMMUNITY): Payer: Self-pay

## 2023-01-26 ENCOUNTER — Telehealth: Payer: Self-pay | Admitting: *Deleted

## 2023-01-26 DIAGNOSIS — M79661 Pain in right lower leg: Secondary | ICD-10-CM | POA: Diagnosis not present

## 2023-01-26 DIAGNOSIS — M7989 Other specified soft tissue disorders: Secondary | ICD-10-CM | POA: Diagnosis not present

## 2023-01-26 MED ORDER — CEPHALEXIN 250 MG PO CAPS
250.0000 mg | ORAL_CAPSULE | Freq: Four times a day (QID) | ORAL | 0 refills | Status: DC
  Filled 2023-01-26: qty 40, 10d supply, fill #0

## 2023-01-26 NOTE — Telephone Encounter (Signed)
Called left detail message on secure voicemail with instructions,  Routed result to patient's primary Dr Wylene Simmer

## 2023-01-26 NOTE — Telephone Encounter (Signed)
Late entry  called  patient @ 3:20 pm   Left detailed message on secure voicemail    Relayed result information from Dr Swaziland- patient started on keflex 250 mg your times a day . Keep appointment 01/29/23 with Juliane Lack NP

## 2023-01-26 NOTE — Telephone Encounter (Signed)
-----   Message from Peter Swaziland sent at 01/26/2023  1:57 PM EDT ----- This study demonstrates:  no DVT but has thrombophlebitis of saphenous vein.  Medication changes / Follow up studies / Other recommendations:   Initiated Keflex 250 mg qid - I sent Rx to [facility] pharmacy Keep appt with Irving Burton next week  Please send results to the PCP:  Tisovec, Adelfa Koh, MD  Peter Swaziland, MD 01/26/2023 1:56 PM

## 2023-01-29 ENCOUNTER — Ambulatory Visit: Payer: PPO | Attending: Nurse Practitioner | Admitting: Nurse Practitioner

## 2023-01-29 ENCOUNTER — Encounter: Payer: Self-pay | Admitting: Nurse Practitioner

## 2023-01-29 VITALS — BP 128/82 | HR 62 | Ht 72.0 in | Wt 179.0 lb

## 2023-01-29 DIAGNOSIS — I340 Nonrheumatic mitral (valve) insufficiency: Secondary | ICD-10-CM

## 2023-01-29 DIAGNOSIS — E785 Hyperlipidemia, unspecified: Secondary | ICD-10-CM

## 2023-01-29 DIAGNOSIS — I10 Essential (primary) hypertension: Secondary | ICD-10-CM | POA: Diagnosis not present

## 2023-01-29 DIAGNOSIS — I8001 Phlebitis and thrombophlebitis of superficial vessels of right lower extremity: Secondary | ICD-10-CM

## 2023-01-29 DIAGNOSIS — I2510 Atherosclerotic heart disease of native coronary artery without angina pectoris: Secondary | ICD-10-CM

## 2023-01-29 DIAGNOSIS — I350 Nonrheumatic aortic (valve) stenosis: Secondary | ICD-10-CM

## 2023-01-29 DIAGNOSIS — Z952 Presence of prosthetic heart valve: Secondary | ICD-10-CM

## 2023-01-29 DIAGNOSIS — I7121 Aneurysm of the ascending aorta, without rupture: Secondary | ICD-10-CM | POA: Diagnosis not present
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
## 2023-01-29 NOTE — Patient Instructions (Signed)
Medication Instructions:  Hold Simvastatin until apt with Pharm-D  *If you need a refill on your cardiac medications before your next appointment, please call your pharmacy*   Lab Work: NONE ordered at this time of appointment     Testing/Procedures: NONE ordered at this time of appointment   Follow-Up: At [facility], you and your health needs are our priority.  As part of our continuing mission to provide you with exceptional heart care, we have created designated Provider Care Teams.  These Care Teams include your primary Cardiologist (physician) and Advanced Practice Providers (APPs -  Physician Assistants and Nurse Practitioners) who all work together to provide you with the care you need, when you need it.  We recommend signing up for the patient portal called "MyChart".  Sign up information is provided on this After Visit Summary.  MyChart is used to connect with patients for Virtual Visits (Telemedicine).  Patients are able to view lab/test results, encounter notes, upcoming appointments, etc.  Non-urgent messages can be sent to your provider as well.   To learn more about what you can do with MyChart, go to [email or web address].    Your next appointment:    Keep follow up   Provider:   Peter Swaziland, MD     Other Instructions

## 2023-02-22 NOTE — Progress Notes (Signed)
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 effects. Ok to continue.   [name] Disposition: Follow-up 6 months  HYPERTENSION CONTROL Vitals:   02/23/23 0934 02/23/23 0955  BP: (!) 150/88 (!) 150/80    The patient's blood pressure is elevated above target today.  In order to address the patient's elevated BP: The blood pressure is usually elevated in clinic.  Blood pressures monitored at home have been optimal.      Scotty Pinder [place], MD,FACC 02/23/2023, 10:03 AM

## 2023-02-23 ENCOUNTER — Ambulatory Visit: Payer: PPO | Admitting: Pharmacist

## 2023-02-23 ENCOUNTER — Ambulatory Visit: Payer: PPO | Attending: Cardiology | Admitting: Cardiology

## 2023-02-23 ENCOUNTER — Encounter: Payer: Self-pay | Admitting: Cardiology

## 2023-02-23 VITALS — BP 150/80 | HR 73 | Ht 72.0 in | Wt 187.6 lb

## 2023-02-23 DIAGNOSIS — Z952 Presence of prosthetic heart valve: Secondary | ICD-10-CM | POA: Diagnosis not present

## 2023-02-23 DIAGNOSIS — T466X5D Adverse effect of antihyperlipidemic and antiarteriosclerotic drugs, subsequent encounter: Secondary | ICD-10-CM

## 2023-02-23 DIAGNOSIS — I340 Nonrheumatic mitral (valve) insufficiency: Secondary | ICD-10-CM

## 2023-02-23 DIAGNOSIS — R413 Other amnesia: Secondary | ICD-10-CM

## 2023-02-23 DIAGNOSIS — E785 Hyperlipidemia, unspecified: Secondary | ICD-10-CM | POA: Diagnosis not present

## 2023-02-23 DIAGNOSIS — I2510 Atherosclerotic heart disease of native coronary artery without angina pectoris: Secondary | ICD-10-CM | POA: Diagnosis not present

## 2023-02-23 DIAGNOSIS — T466X5A Adverse effect of antihyperlipidemic and antiarteriosclerotic drugs, initial encounter: Secondary | ICD-10-CM

## 2023-02-23 DIAGNOSIS — R931 Abnormal findings on diagnostic imaging of heart and coronary circulation: Secondary | ICD-10-CM

## 2023-02-23 DIAGNOSIS — I10 Essential (primary) hypertension: Secondary | ICD-10-CM | POA: Diagnosis not present

## 2023-02-23 DIAGNOSIS — I700 Atherosclerosis of aorta: Secondary | ICD-10-CM

## 2023-02-23 MED ORDER — ALPRAZOLAM 1 MG PO TABS: ORAL_TABLET | ORAL | 1 refills | Status: DC
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
## 2023-02-23 NOTE — Patient Instructions (Addendum)
It was nice meeting you today  We would like your LDL (bad cholesterol) to be less than 70  The medication we discussed today is called Repatha which is an injection you would take once every 2 weeks  I will complete the prior authorization for you and contact you when it is approved   Once you start the medication we will recheck your fasting lipid panel in 2-3 months  I have also placed a neurology referral for you  Please let us know if you have any questions  Laural Golden, PharmD, BCACP, CDCES, CPP [address] [place], [place], [address] Phone: [phone], Fax: [phone]

## 2023-02-23 NOTE — Patient Instructions (Signed)
Medication Instructions:  Continue same medications *If you need a refill on your cardiac medications before your next appointment, please call your pharmacy*   Lab Work: Bmet,lipid and hepatic panels today   Testing/Procedures: None ordered   Follow-Up: At [facility], you and your health needs are our priority.  As part of our continuing mission to provide you with exceptional heart care, we have created designated Provider Care Teams.  These Care Teams include your primary Cardiologist (physician) and Advanced Practice Providers (APPs -  Physician Assistants and Nurse Practitioners) who all work together to provide you with the care you need, when you need it.  We recommend signing up for the patient portal called "MyChart".  Sign up information is provided on this After Visit Summary.  MyChart is used to connect with patients for Virtual Visits (Telemedicine).  Patients are able to view lab/test results, encounter notes, upcoming appointments, etc.  Non-urgent messages can be sent to your provider as well.   To learn more about what you can do with MyChart, go to [email or web address].    Your next appointment:  6 months    Call in Feb to schedule May appointment     Provider:  Dr.Jordan

## 2023-02-24 LAB — BASIC METABOLIC PANEL
BUN/Creatinine Ratio: 12 (ref 10–24)
BUN: 15 mg/dL (ref 8–27)
CO2: 27 mmol/L (ref 20–29)
Calcium: 9 mg/dL (ref 8.6–10.2)
Chloride: 103 mmol/L (ref 96–106)
Creatinine, Ser: 1.23 mg/dL (ref 0.76–1.27)
Glucose: 170 mg/dL — ABNORMAL HIGH (ref 70–99)
Potassium: 4.7 mmol/L (ref 3.5–5.2)
Sodium: 142 mmol/L (ref 134–144)
eGFR: 60 mL/min/{1.73_m2} (ref >59)

## 2023-02-24 LAB — HEPATIC FUNCTION PANEL
ALT: 20 [IU]/L (ref 0–44)
AST: 22 [IU]/L (ref 0–40)
Albumin: 4.2 g/dL (ref 3.8–4.8)
Alkaline Phosphatase: 54 [IU]/L (ref 44–121)
Bilirubin Total: 0.4 mg/dL (ref 0.0–1.2)
Bilirubin, Direct: 0.14 mg/dL (ref 0.00–0.40)
Total Protein: 6.5 g/dL (ref 6.0–8.5)

## 2023-02-24 LAB — LIPID PANEL
Chol/HDL Ratio: 3.7 ratio (ref 0.0–5.0)
Cholesterol, Total: 203 mg/dL — ABNORMAL HIGH (ref 100–199)
HDL: 55 mg/dL (ref >39)
LDL Chol Calc (NIH): 134 mg/dL — ABNORMAL HIGH (ref 0–99)
Triglycerides: 80 mg/dL (ref 0–149)
VLDL Cholesterol Cal: 14 mg/dL (ref 5–40)

## 2023-02-25 ENCOUNTER — Encounter: Payer: Self-pay | Admitting: Pharmacist

## 2023-02-25 ENCOUNTER — Telehealth: Payer: Self-pay | Admitting: Pharmacist

## 2023-02-25 DIAGNOSIS — R931 Abnormal findings on diagnostic imaging of heart and coronary circulation: Secondary | ICD-10-CM | POA: Insufficient documentation

## 2023-02-25 DIAGNOSIS — I2510 Atherosclerotic heart disease of native coronary artery without angina pectoris: Secondary | ICD-10-CM | POA: Insufficient documentation

## 2023-02-25 NOTE — Telephone Encounter (Signed)
Please complete PA for Repatha 

## 2023-02-26 ENCOUNTER — Other Ambulatory Visit (HOSPITAL_COMMUNITY): Payer: Self-pay

## 2023-02-26 ENCOUNTER — Telehealth: Payer: Self-pay

## 2023-02-26 DIAGNOSIS — R931 Abnormal findings on diagnostic imaging of heart and coronary circulation: Secondary | ICD-10-CM

## 2023-02-26 DIAGNOSIS — E7800 Pure hypercholesterolemia, unspecified: Secondary | ICD-10-CM

## 2023-02-26 DIAGNOSIS — I700 Atherosclerosis of aorta: Secondary | ICD-10-CM

## 2023-02-26 NOTE — Telephone Encounter (Signed)
Pharmacy Patient Advocate Encounter   Received notification from Physician's Office that prior authorization for REPATHA is required/requested.   Insurance verification completed.   The patient is insured through [facility] ADVANTAGE/RX ADVANCE .   Per test claim: PA required; PA submitted to above mentioned insurance via CoverMyMeds Key/confirmation #[id number] Status is pending

## 2023-02-26 NOTE — Telephone Encounter (Signed)
PA request has been Submitted. New Encounter created for follow up. For additional info see Pharmacy Prior Auth telephone encounter from 02/26/23.

## 2023-02-27 ENCOUNTER — Other Ambulatory Visit (HOSPITAL_COMMUNITY): Payer: Self-pay

## 2023-02-27 MED ORDER — REPATHA SURECLICK 140 MG/ML ~~LOC~~ SOAJ: 1.0000 mL | SUBCUTANEOUS | 1 refills | Status: AC

## 2023-02-27 NOTE — Telephone Encounter (Signed)
Pharmacy Patient Advocate Encounter  Received notification from [facility] ADVANTAGE/RX ADVANCE that Prior Authorization for REPATHA has been APPROVED from 02/26/23 to 08/26/23. Ran test claim, Copay is $47. This test claim was processed through [facility]- copay amounts may vary at other pharmacies due to pharmacy/plan contracts, or as the patient moves through the different stages of their insurance plan.

## 2023-02-27 NOTE — Addendum Note (Signed)
Addended by: Cheree Ditto on: 02/27/2023 10:02 AM   Modules accepted: Orders

## 2023-03-08 ENCOUNTER — Encounter: Payer: Self-pay | Admitting: Physician Assistant

## 2023-03-30 ENCOUNTER — Other Ambulatory Visit: Payer: Self-pay | Admitting: Cardiology

## 2023-04-08 NOTE — Progress Notes (Addendum)
 Assessment/Plan:     [name] [name] is a very pleasant 78 y.o. year old RH male with a history of hypertension, hyperlipidemia, hypothyroidism, carotid artery stenosis, CAD, aortic stenosis status post AVR, history of thoracic aneurysm, iron deficiency anemia, insomnia, anxiety, depression, history of hepatitis A in the past, history of retinal ischemia, seen today for evaluation of memory loss. MoCA today is 17/30.  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  He is able to participate on his ADLs and drive without difficulty.  He does admit to high degree of anxiety, and overthinking the condition .  He has even tried nicotine patch for the memory .  He likes to read different information from different sources, some of them of unclear reliability.  We discussed initiating dementia medication i.e. Aricept  but patient prefers to wait for results of the MRI before proceeding with any prescribed meds.  He remains active.  Workup is in progress.   MRI brain without contrast to assess for underlying structural abnormality and assess vascular load  Neurocognitive testing to further evaluate cognitive concerns and determine other underlying cause of memory changes, including potential contribution from sleep, anxiety, attention, or depression among others  Check B12, B1 , TSH Recommend good control of cardiovascular risk factors. Recommend psychotherapy for situational anxiety/depression Continue to control mood as per PCP Discontinue nicotine patch used for memory. Folllow up in 2 months  Subjective:    The patient is here alone    How long did patient have memory difficulties?  For about 6 months. I lost a friend and I recognize how they quickly lost it, how he deteriorated I became concerned, wanted to have myself checked   Patient reports some difficulty remembering new information, recent conversations,  He admits  to forget the context of the conversation which is bothersome to him.  I  always was bad with names so it has always been a problem .   repeats oneself?  Denies  Disoriented when walking into a room?  Patient denies    Leaving objects in unusual places?  Denies.   Wandering behavior? Denies.   Any personality changes, or depression, anxiety? Anxiety, definitely.  Had a horrible episode of depression when I retired, I should not have retired. 1 year ago could not get out of bed, etc, but friends helped me and kept me busy takes Xanax  for the last 30 years, tried to quit it, but could not.   Hallucinations or paranoia? Denies.    Seizures? Denies.    Any sleep changes?  Does not sleep well. Takes Xanax  to help him sleeps.  Denies vivid dreams, REM behavior or sleepwalking   Sleep apnea? Denies.   Any hygiene concerns?  Denies.   Independent of bathing and dressing? Endorsed  Does the patient need help with medications? Patient is in charge   Who is in charge of the finances? Patient  is in charge     Any changes in appetite?   Denies.     Patient have trouble swallowing?  Denies.   Does the patient cook? No  Any headaches?  Denies.   Chronic pain? Denies.  Has some back pain, I changed some exercises , but takes Advil with some relief.  Ambulates with difficulty? Goes to [employer], weightlifting and treadmill.  Recent falls or head injuries? Denies.     Vision changes?  Denies any new issues.  Has a history of L lazy eye , had corrective surgery Any strokelike symptoms? Denies.  Any tremors? Denies.   Any anosmia? Denies.   Any incontinence of urine?  He has nocturia, history of BPH Any bowel dysfunction? I have a tendency to get clogged up     Patient lives with wife  History of heavy alcohol  intake? A beer once a day  History of heavy tobacco use? Denies.   Family history of dementia? Mo and Fa died with  dementia ? Type.  Does patient drive? Yes, mostly to familiar places.He had to review the directions before driving.     No Known  Allergies  Current Outpatient Medications  Medication Instructions   ALPRAZolam  (XANAX ) 1 MG tablet TAKE 1 TABLET BY MOUTH EVERY DAY AT BEDTIME AS NEEDED   amoxicillin  (AMOXIL ) 500 MG capsule Take 4 tablets by mouth 30-60 minutes prior to dental cleaning   aspirin  EC 81 mg, Oral, Daily   buPROPion  (WELLBUTRIN  SR) 200 mg, Oral, 2 times daily   calcium  carbonate (OS-CAL) 600 mg, Every morning   Glucosamine-Chondroitin (COSAMIN DS PO) 1 tablet, Every morning   Iron 325 mg, Every morning   levothyroxine (SYNTHROID) 25 mcg, Daily before breakfast   losartan  (COZAAR ) 100 mg, Oral, Daily   magnesium  oxide (MAG-OX) 1,200 mg, Every morning   metoprolol  succinate (TOPROL -XL) 25 mg, Oral, Daily   Omega-3 Fatty Acids (FISH OIL PO) 1,000 mg, Every morning   Polyethyl Glycol-Propyl Glycol (SYSTANE ULTRA) 0.4-0.3 % SOLN 1-2 drops, See admin instructions   QUERCETIN PO 1,000 mg, Every morning   Repatha  SureClick 140 mg, Subcutaneous, Every 14 days   sertraline (ZOLOFT) 25 mg, Oral, Every morning   vitamin C 1,000 mg, Oral, Daily   Vitamin D3 5,000 Units, Oral, Every morning   zinc gluconate 50 mg, Every morning     VITALS:   Vitals:   04/11/23 1303  BP: 120/80  Resp: 18  SpO2: 98%  Weight: 190 lb (86.2 kg)  Height: 6' (1.829 m)      PHYSICAL EXAM   HEENT:  Normocephalic, atraumatic.  The superficial temporal arteries are without ropiness or tenderness. Cardiovascular: Regular rate and rhythm, soft 1 out of 6 systolic murmur. Lungs: Clear to auscultation bilaterally. Neck: There are some bilateral 1/4 carotid bruits noted. NEUROLOGICAL:    04/11/2023    5:00 PM  Montreal Cognitive Assessment   Visuospatial/ Executive (0/5) 1  Naming (0/3) 3  Attention: Read list of digits (0/2) 1  Attention: Read list of letters (0/1) 1  Attention: Serial 7 subtraction starting at 100 (0/3) 1  Language: Repeat phrase (0/2) 2  Language : Fluency (0/1) 1  Abstraction (0/2) 2  Delayed Recall (0/5) 0   Orientation (0/6) 4  Total 16  Adjusted Score (based on education) 17        No data to display           Orientation:  Alert and oriented to person, place and not to time. No aphasia or dysarthria. Fund of knowledge is appropriate. Recent and remote memory impaired.  Attention and concentration are reduced.  Able to name objects and repeat phrases. Delayed recall 0/5 Cranial nerves: There is good facial symmetry. Extraocular muscles are intact and visual fields are full to confrontational testing. Speech is fluent and clear, tangential. No tongue deviation. Hearing is intact to conversational tone. Tone: Tone is good throughout. Sensation: Sensation is intact to light touch.  Vibration is intact at the bilateral big toe.  Coordination: The patient has no difficulty with RAM's or FNF bilaterally. Normal finger to  nose  Motor: Strength is 5/5 in the bilateral upper and lower extremities. There is no pronator drift. There are no fasciculations noted. DTR's: Deep tendon reflexes are 2/4 bilaterally. Gait and Station: The patient is able to ambulate without difficulty. Gait is cautious and narrow. Stride length is normal     Thank you for allowing us  the opportunity to participate in the care of this nice patient. Please do not hesitate to contact us  for any questions or concerns.   Total time spent on today's visit was 60 minutes dedicated to this patient today, preparing to see patient, examining the patient, ordering tests and/or medications and counseling the patient, documenting clinical information in the EHR or other health record, independently interpreting results and communicating results to the patient/family, discussing treatment and goals, answering patient's questions and coordinating care.  Cc:  Tisovec, Charlie ORN, MD  Camie Sevin 04/11/2023 5:56 PM

## 2023-04-11 ENCOUNTER — Other Ambulatory Visit: Payer: PPO

## 2023-04-11 ENCOUNTER — Ambulatory Visit: Payer: PPO

## 2023-04-11 ENCOUNTER — Encounter: Payer: Self-pay | Admitting: Physician Assistant

## 2023-04-11 ENCOUNTER — Ambulatory Visit: Payer: PPO | Admitting: Physician Assistant

## 2023-04-11 VITALS — BP 120/80 | Resp 18 | Ht 72.0 in | Wt 190.0 lb

## 2023-04-11 DIAGNOSIS — R413 Other amnesia: Secondary | ICD-10-CM | POA: Insufficient documentation

## 2023-04-11 NOTE — Patient Instructions (Addendum)
 It was a pleasure to see you today at our office.   Recommendations:  Neurocognitive evaluation at our office  MRI of the brain, the radiology office will call you to arrange you appointment [phone] Check labs today [address] Consider psychotherapy Follow up in 2 months   For psychiatric meds, mood meds: Please have your primary care physician manage these medications.  If you have any severe symptoms of a stroke, or other severe issues such as confusion,severe chills or fever, etc call 911 or go to the ER as you may need to be evaluated further  For assessment of decision of mental capacity and competency:  Call Dr. Rosaline Nine, geriatric psychiatrist at [phone]  Counseling regarding caregiver distress, including caregiver depression, anxiety and issues regarding community resources, adult day care programs, adult living facilities, or memory care questions:  please contact your  Primary Doctor's Social Worker   Whom to call: Memory  decline, memory medications: Call our office [phone]    [email or web address]   RECOMMENDATIONS FOR ALL PATIENTS WITH MEMORY PROBLEMS: 1. Continue to exercise (Recommend 30 minutes of walking everyday, or 3 hours every week) 2. Increase social interactions - continue going to [place] and enjoy social gatherings with friends and family 3. Eat healthy, avoid fried foods and eat more fruits and vegetables 4. Maintain adequate blood pressure, blood sugar, and blood cholesterol level. Reducing the risk of stroke and cardiovascular disease also helps promoting better memory. 5. Avoid stressful situations. Live a simple life and avoid aggravations. Organize your time and prepare for the next day in anticipation. 6. Sleep well, avoid any interruptions of sleep and avoid any distractions in the bedroom that may interfere with adequate sleep quality 7. Avoid sugar, avoid sweets as there is a strong  link between excessive sugar intake, diabetes, and cognitive impairment We discussed the Mediterranean diet, which has been shown to help patients reduce the risk of progressive memory disorders and reduces cardiovascular risk. This includes eating fish, eat fruits and green leafy vegetables, nuts like almonds and hazelnuts, walnuts, and also use olive oil. Avoid fast foods and fried foods as much as possible. Avoid sweets and sugar as sugar use has been linked to worsening of memory function.  There is always a concern of gradual progression of memory problems. If this is the case, then we may need to adjust level of care according to patient needs. Support, both to the patient and caregiver, should then be put into place.      You have been referred for a neuropsychological evaluation (i.e., evaluation of memory and thinking abilities). Please bring someone with you to this appointment if possible, as it is helpful for the doctor to hear from both you and another adult who knows you well. Please bring eyeglasses and hearing aids if you wear them.    The evaluation will take approximately 3 hours and has two parts:   The first part is a clinical interview with the neuropsychologist (Dr. Richie or Dr. Jackquline). During the interview, the neuropsychologist will speak with you and the individual you brought to the appointment.    The second part of the evaluation is testing with the doctor's technician Neal or Luke). During the testing, the technician will ask you to remember different types of material, solve problems, and answer some questionnaires. Your family member will not be present for this portion of the evaluation.   Please note: We must reserve several hours of the neuropsychologist's time and the psychometrician's time  for your evaluation appointment. As such, there is a No-Show fee of $100. If you are unable to attend any of your appointments, please contact our office as soon as possible to  reschedule.      DRIVING: Regarding driving, in patients with progressive memory problems, driving will be impaired. We advise to have someone else do the driving if trouble finding directions or if minor accidents are reported. Independent driving assessment is available to determine safety of driving.   If you are interested in the driving assessment, you can contact the following:  [facility] in [place] [phone]  [facility] [phone]  [facility] [phone]  [facility] [phone] or [phone]   FALL PRECAUTIONS: Be cautious when walking. Scan the area for obstacles that may increase the risk of trips and falls. When getting up in the mornings, sit up at the edge of the bed for a few minutes before getting out of bed. Consider elevating the bed at the head end to avoid drop of blood pressure when getting up. Walk always in a well-lit room (use night lights in the walls). Avoid area rugs or power cords from appliances in the middle of the walkways. Use a walker or a cane if necessary and consider physical therapy for balance exercise. Get your eyesight checked regularly.  FINANCIAL OVERSIGHT: Supervision, especially oversight when making financial decisions or transactions is also recommended.  HOME SAFETY: Consider the safety of the kitchen when operating appliances like stoves, microwave oven, and blender. Consider having supervision and share cooking responsibilities until no longer able to participate in those. Accidents with firearms and other hazards in the house should be identified and addressed as well.   ABILITY TO BE LEFT ALONE: If patient is unable to contact 911 operator, consider using LifeLine, or when the need is there, arrange for someone to stay with patients. Smoking is a fire hazard, consider supervision or cessation. Risk of wandering should be assessed by caregiver and if detected at any point,  supervision and safe proof recommendations should be instituted.  MEDICATION SUPERVISION: Inability to self-administer medication needs to be constantly addressed. Implement a mechanism to ensure safe administration of the medications.      Mediterranean Diet A Mediterranean diet refers to food and lifestyle choices that are based on the traditions of countries located on the [place]. This way of eating has been shown to help prevent certain conditions and improve outcomes for people who have chronic diseases, like kidney disease and heart disease. What are tips for following this plan? Lifestyle  Cook and eat meals together with your family, when possible. Drink enough fluid to keep your urine clear or pale yellow. Be physically active every day. This includes: Aerobic exercise like running or swimming. Leisure activities like gardening, walking, or housework. Get 7-8 hours of sleep each night. If recommended by your health care provider, drink red wine in moderation. This means 1 glass a day for nonpregnant women and 2 glasses a day for men. A glass of wine equals 5 oz (150 mL). Reading food labels  Check the serving size of packaged foods. For foods such as rice and pasta, the serving size refers to the amount of cooked product, not dry. Check the total fat in packaged foods. Avoid foods that have saturated fat or trans fats. Check the ingredients list for added sugars, such as corn syrup. Shopping  At the grocery store, buy most of your food from the areas near the  walls of the store. This includes: Fresh fruits and vegetables (produce). Grains, beans, nuts, and seeds. Some of these may be available in unpackaged forms or large amounts (in bulk). Fresh seafood. Poultry and eggs. Low-fat dairy products. Buy whole ingredients instead of prepackaged foods. Buy fresh fruits and vegetables in-season from local farmers markets. Buy frozen fruits and vegetables in resealable  bags. If you do not have access to quality fresh seafood, buy precooked frozen shrimp or canned fish, such as tuna, salmon, or sardines. Buy small amounts of raw or cooked vegetables, salads, or olives from the deli or salad bar at your store. Stock your pantry so you always have certain foods on hand, such as olive oil, canned tuna, canned tomatoes, rice, pasta, and beans. Cooking  Cook foods with extra-virgin olive oil instead of using butter or other vegetable oils. Have meat as a side dish, and have vegetables or grains as your main dish. This means having meat in small portions or adding small amounts of meat to foods like pasta or stew. Use beans or vegetables instead of meat in common dishes like chili or lasagna. Experiment with different cooking methods. Try roasting or broiling vegetables instead of steaming or sauteing them. Add frozen vegetables to soups, stews, pasta, or rice. Add nuts or seeds for added healthy fat at each meal. You can add these to yogurt, salads, or vegetable dishes. Marinate fish or vegetables using olive oil, lemon juice, garlic, and fresh herbs. Meal planning  Plan to eat 1 vegetarian meal one day each week. Try to work up to 2 vegetarian meals, if possible. Eat seafood 2 or more times a week. Have healthy snacks readily available, such as: Vegetable sticks with hummus. Greek yogurt. Fruit and nut trail mix. Eat balanced meals throughout the week. This includes: Fruit: 2-3 servings a day Vegetables: 4-5 servings a day Low-fat dairy: 2 servings a day Fish, poultry, or lean meat: 1 serving a day Beans and legumes: 2 or more servings a week Nuts and seeds: 1-2 servings a day Whole grains: 6-8 servings a day Extra-virgin olive oil: 3-4 servings a day Limit red meat and sweets to only a few servings a month What are my food choices? Mediterranean diet Recommended Grains: Whole-grain pasta. Brown rice. Bulgar wheat. Polenta. Couscous. Whole-wheat bread.  [place]. Vegetables: Artichokes. Beets. Broccoli. Cabbage. Carrots. Eggplant. Green beans. Chard. Kale. Spinach. Onions. Leeks. Peas. Squash. Tomatoes. Peppers. Radishes. Fruits: Apples. Apricots. Avocado. Berries. Bananas. Cherries. Dates. Figs. Grapes. Lemons. Melon. Oranges. Peaches. Plums. Pomegranate. Meats and other protein foods: Beans. Almonds. Sunflower seeds. Pine nuts. Peanuts. Cod. Salmon. Scallops. Shrimp. Tuna. Tilapia. Clams. Oysters. Eggs. Dairy: Low-fat milk. Cheese. Greek yogurt. Beverages: Water. Red wine. Herbal tea. Fats and oils: Extra virgin olive oil. Avocado oil. Grape seed oil. Sweets and desserts: Greek yogurt with honey. Baked apples. Poached pears. Trail mix. Seasoning and other foods: Basil. Cilantro. Coriander. Cumin. Mint. Parsley. Sage. Rosemary. Tarragon. Garlic. Oregano. Thyme. Pepper. Balsalmic vinegar. Tahini. Hummus. Tomato sauce. Olives. Mushrooms. Limit these Grains: Prepackaged pasta or rice dishes. Prepackaged cereal with added sugar. Vegetables: Deep fried potatoes (french fries). Fruits: Fruit canned in syrup. Meats and other protein foods: Beef. Pork. Lamb. Poultry with skin. Hot dogs. [place]. Dairy: Ice cream. Sour cream. Whole milk. Beverages: Juice. Sugar-sweetened soft drinks. Beer. Liquor and spirits. Fats and oils: Butter. Canola oil. Vegetable oil. Beef fat (tallow). Lard. Sweets and desserts: Cookies. Cakes. Pies. Candy. Seasoning and other foods: Mayonnaise. Premade sauces and marinades. The items  listed may not be a complete list. Talk with your dietitian about what dietary choices are right for you. Summary The Mediterranean diet includes both food and lifestyle choices. Eat a variety of fresh fruits and vegetables, beans, nuts, seeds, and whole grains. Limit the amount of red meat and sweets that you eat. Talk with your health care provider about whether it is safe for you to drink red wine in moderation. This means 1 glass a day  for nonpregnant women and 2 glasses a day for men. A glass of wine equals 5 oz (150 mL). This information is not intended to replace advice given to you by your health care provider. Make sure you discuss any questions you have with your health care provider. Document Released: 11/11/2015 Document Revised: 12/14/2015 Document Reviewed: 11/11/2015 Elsevier Interactive Patient Education  2017 [employer].

## 2023-04-12 NOTE — Progress Notes (Signed)
 B12 is on the lower normal, recommend 1000 mcg daily and follow results with primary. Thyroid levels are normal . B1 will be pending for a few days, it takes a little longer, thanks

## 2023-04-16 ENCOUNTER — Ambulatory Visit
Admission: RE | Admit: 2023-04-16 | Discharge: 2023-04-16 | Discharge status: Home / Self Care | Payer: PPO | Source: Ambulatory Visit | Attending: Physician Assistant | Admitting: Physician Assistant

## 2023-04-16 DIAGNOSIS — G319 Degenerative disease of nervous system, unspecified: Secondary | ICD-10-CM | POA: Diagnosis not present

## 2023-04-16 DIAGNOSIS — R413 Other amnesia: Secondary | ICD-10-CM | POA: Diagnosis not present

## 2023-04-16 DIAGNOSIS — I6782 Cerebral ischemia: Secondary | ICD-10-CM | POA: Diagnosis not present

## 2023-04-17 LAB — VITAMIN B1: Vitamin B1 (Thiamine): 8 nmol/L (ref 8–30)

## 2023-04-17 LAB — VITAMIN B12: Vitamin B-12: 424 pg/mL (ref 200–1100)

## 2023-04-17 LAB — TSH: TSH: 2.39 m[IU]/L (ref 0.40–4.50)

## 2023-04-17 NOTE — Progress Notes (Signed)
 B1 is low, needs to take 100 mg daily of vit B1. Recommend no alcohol as it may contribute to low B1 and memory thanks

## 2023-05-07 ENCOUNTER — Other Ambulatory Visit: Payer: Self-pay | Admitting: Cardiology

## 2023-05-08 NOTE — Telephone Encounter (Signed)
Pt's pharmacy is requesting a refill on alprazolam. Would Dr. [place] like to refill this non cardiac medication? Please address

## 2023-05-28 ENCOUNTER — Ambulatory Visit: Payer: PPO | Admitting: Physician Assistant

## 2023-05-28 ENCOUNTER — Encounter: Payer: Self-pay | Admitting: Physician Assistant

## 2023-05-28 DIAGNOSIS — Z029 Encounter for administrative examinations, unspecified: Secondary | ICD-10-CM

## 2023-05-28 NOTE — Progress Notes (Incomplete)
Assessment/Plan:   Mild Cognitive Impairment likely due to   [name] [name] is a very pleasant 78 y.o. RH male  with a history of hypertension, hyperlipidemia, hypothyroidism, carotid artery stenosis, CAD, aortic stenosis status post AVR, history of thoracic aneurysm, iron deficiency anemia, insomnia, anxiety, depression, history of hepatitis A in the past, history of retinal ischemia seen today in follow up to discuss the MRI of the brain results from 04/16/2023. These were personally reviewed, remarkable for mild to moderate generalized cerebral atrophy, small chronic cortical-subcortical infarct within the medial right parietal lobe, tiny chronic infarct is question within the inferior right cerebellar hemisphere and several chronic microhemorrhages scattered within the cerebellum and supratentorial brain.  This is likely sequela of chronic hypertensive microangiopathy versus early manifestations of cerebral amyloid angiopathy), chronic small vessel disease, and prior subarachnoid hemorrhage, no acute infarct or masses or fluid. As recalled, his MoCA on 04/11/2023 was 17/30, suggesting amnestic MCI of unclear etiology.  He has significant degree of anxiety about his memory, reading different information from different sources some of the pain of unclear reliability, such as "nicotine patch for memory ". He is not on memantine dementia medication at this time, discussed its role on memory and side effects***.   Previous records as well as any outside records available were reviewed prior to todays visit.     Follow up in   months. ***Start donepezil 5 mg daily, side effects discussed.  If tolerated, may consider increasing it to 10 mg daily in the near future. Replenish B1 (8), and B12 (borderline low at 424) Patient is scheduled for neuropsych evaluation for diagnostic clarity in the near future. Recommend good control of cardiovascular risk factors Continue to control mood as per PCP Recommend  psychotherapy for situational anxiety and depression   Initial visit 04/11/2023    How long did patient have memory difficulties?  For about 6 months. "I lost a friend and I recognize how they quickly lost it, how he deteriorated I became concerned, wanted to have myself checked "  Patient reports some difficulty remembering new information, recent conversations,  He admits  to forget the context of the conversation which is bothersome to him.  "I always was bad with names so it has always been a problem ".   repeats oneself?  Denies  Disoriented when walking into a room?  Patient denies    Leaving objects in unusual places?  Denies.   Wandering behavior? Denies.   Any personality changes, or depression, anxiety? "Anxiety, definitely".  "Had a horrible episode of depression when I retired, I should not have retired". 1 year ago could not get out of bed, etc, but friends helped me and kept me busy takes Xanax for the last 30 years, tried to quit it, but could not.   Hallucinations or paranoia? Denies.    Seizures? Denies.    Any sleep changes?  Does not sleep well. Takes Xanax to help him sleeps.  Denies vivid dreams, REM behavior or sleepwalking   Sleep apnea? Denies.   Any hygiene concerns?  Denies.   Independent of bathing and dressing? Endorsed  Does the patient need help with medications? Patient is in charge   Who is in charge of the finances? Patient  is in charge     Any changes in appetite?   Denies.     Patient have trouble swallowing?  Denies.   Does the patient cook? No  Any headaches?  Denies.   Chronic pain? Denies.  Has some back pain," I changed some exercises ", but takes Advil with some relief.  Ambulates with difficulty? Goes to [employer], weightlifting and treadmill.  Recent falls or head injuries? Denies.     Vision changes?  Denies any new issues.  Has a history of "L lazy eye ", had corrective surgery Any strokelike symptoms? Denies.   Any tremors? Denies.   Any  anosmia? Denies.   Any incontinence of urine?  He has nocturia, history of BPH Any bowel dysfunction? "I have a tendency to get clogged up"     Patient lives with wife  History of heavy alcohol intake? A beer once a day  History of heavy tobacco use? Denies.   Family history of dementia? Mo and Fa died with  dementia ? Type.  Does patient drive? Yes, mostly to familiar places.He had to review the directions before driving.    Pertinent labs January 2025, B12 424, TSH 2.39 and B1 low at 8 CURRENT MEDICATIONS:  Outpatient Encounter Medications as of 05/28/2023  Medication Sig   ALPRAZolam (XANAX) 1 MG tablet TAKE 1 TABLET BY MOUTH AT BEDTIME AS NEEDED   amoxicillin (AMOXIL) 500 MG capsule Take 4 tablets by mouth 30-60 minutes prior to dental cleaning (Patient not taking: Reported on 04/11/2023)   Ascorbic Acid (VITAMIN C) 1000 MG tablet Take 1,000 mg by mouth daily.   aspirin EC 81 MG tablet Take 1 tablet (81 mg total) by mouth daily.   buPROPion (WELLBUTRIN SR) 200 MG 12 hr tablet Take 1 tablet (200 mg total) by mouth 2 (two) times daily.   calcium carbonate (OS-CAL) 600 MG TABS Take 600 mg by mouth in the morning.   Cholecalciferol (VITAMIN D3) 125 MCG (5000 UT) TABS Take 5,000 Units by mouth in the morning.   Evolocumab (REPATHA SURECLICK) 140 MG/ML SOAJ Inject 140 mg into the skin every 14 (fourteen) days.   Ferrous Sulfate (IRON) 325 (65 FE) MG TABS Take 325 mg by mouth in the morning.   Glucosamine-Chondroitin (COSAMIN DS PO) Take 1 tablet by mouth in the morning.   levothyroxine (SYNTHROID) 25 MCG tablet Take 25 mcg by mouth daily before breakfast.   losartan (COZAAR) 100 MG tablet Take 1 tablet by mouth once daily   magnesium oxide (MAG-OX) 400 (240 Mg) MG tablet Take 1,200 mg by mouth in the morning.   metoprolol succinate (TOPROL-XL) 25 MG 24 hr tablet Take 1 tablet (25 mg total) by mouth daily.   Omega-3 Fatty Acids (FISH OIL PO) Take 1,000 mg by mouth in the morning.   Polyethyl  Glycol-Propyl Glycol (SYSTANE ULTRA) 0.4-0.3 % SOLN Place 1-2 drops into both eyes See admin instructions. Instill 1 drop into both eyes (scheduled) twice daily & may use as needed for dry/irritated eyes.   QUERCETIN PO Take 1,000 mg by mouth in the morning.   sertraline (ZOLOFT) 50 MG tablet Take 25 mg by mouth in the morning.   zinc gluconate 50 MG tablet Take 50 mg by mouth in the morning.   No facility-administered encounter medications on file as of 05/28/2023.        No data to display            04/11/2023    5:00 PM  Montreal Cognitive Assessment   Visuospatial/ Executive (0/5) 1  Naming (0/3) 3  Attention: Read list of digits (0/2) 1  Attention: Read list of letters (0/1) 1  Attention: Serial 7 subtraction starting at 100 (0/3) 1  Language: Repeat  phrase (0/2) 2  Language : Fluency (0/1) 1  Abstraction (0/2) 2  Delayed Recall (0/5) 0  Orientation (0/6) 4  Total 16  Adjusted Score (based on education) 17   Thank you for allowing [place] the opportunity to participate in the care of this nice patient. Please do not hesitate to contact us for any questions or concerns.   Total time spent on today's visit was *** minutes dedicated to this patient today, preparing to see patient, examining the patient, ordering tests and/or medications and counseling the patient, documenting clinical information in the EHR or other health record, independently interpreting results and communicating results to the patient/family, discussing treatment and goals, answering patient's questions and coordinating care.  Cc:  Tisovec, Adelfa Koh, MD  Marlowe Kays 05/28/2023 6:07 AM

## 2023-05-30 DIAGNOSIS — H18593 Other hereditary corneal dystrophies, bilateral: Secondary | ICD-10-CM | POA: Diagnosis not present

## 2023-06-13 ENCOUNTER — Ambulatory Visit (INDEPENDENT_AMBULATORY_CARE_PROVIDER_SITE_OTHER): Admitting: Physician Assistant

## 2023-06-13 ENCOUNTER — Encounter: Payer: Self-pay | Admitting: Physician Assistant

## 2023-06-13 VITALS — BP 149/91 | HR 60 | Resp 20 | Ht 72.0 in | Wt 189.0 lb

## 2023-06-13 DIAGNOSIS — R413 Other amnesia: Secondary | ICD-10-CM | POA: Diagnosis not present

## 2023-06-13 MED ORDER — DONEPEZIL HCL 10 MG PO TABS
ORAL_TABLET | ORAL | 11 refills | Status: DC
Start: 2023-06-13 — End: 2023-10-10

## 2023-06-13 NOTE — Patient Instructions (Signed)
 It was a pleasure to see you today at our office.   Recommendations:  Neurocognitive evaluation at our office   We will start donepezil half tablet (5mg ) daily for 2  weeks.  If you are tolerating the medication, then after 2 weeks, we will increase the dose to a full tablet of 10 mg daily.    Consider psychotherapy Follow up in  6 months    For psychiatric meds, mood meds: Please have your primary care physician manage these medications.  If you have any severe symptoms of a stroke, or other severe issues such as confusion,severe chills or fever, etc call 911 or go to the ER as you may need to be evaluated further  For assessment of decision of mental capacity and competency:  Call Dr. Erick Blinks, geriatric psychiatrist at [phone]  Counseling regarding caregiver distress, including caregiver depression, anxiety and issues regarding community resources, adult day care programs, adult living facilities, or memory care questions:  please contact your  Primary Doctor's Social Worker   Whom to call: Memory  decline, memory medications: Call our office [phone]    [email or web address]   RECOMMENDATIONS FOR ALL PATIENTS WITH MEMORY PROBLEMS: 1. Continue to exercise (Recommend 30 minutes of walking everyday, or 3 hours every week) 2. Increase social interactions - continue going to [place] and enjoy social gatherings with friends and family 3. Eat healthy, avoid fried foods and eat more fruits and vegetables 4. Maintain adequate blood pressure, blood sugar, and blood cholesterol level. Reducing the risk of stroke and cardiovascular disease also helps promoting better memory. 5. Avoid stressful situations. Live a simple life and avoid aggravations. Organize your time and prepare for the next day in anticipation. 6. Sleep well, avoid any interruptions of sleep and avoid any distractions in the bedroom that may interfere with  adequate sleep quality 7. Avoid sugar, avoid sweets as there is a strong link between excessive sugar intake, diabetes, and cognitive impairment We discussed the Mediterranean diet, which has been shown to help patients reduce the risk of progressive memory disorders and reduces cardiovascular risk. This includes eating fish, eat fruits and green leafy vegetables, nuts like almonds and hazelnuts, walnuts, and also use olive oil. Avoid fast foods and fried foods as much as possible. Avoid sweets and sugar as sugar use has been linked to worsening of memory function.  There is always a concern of gradual progression of memory problems. If this is the case, then we may need to adjust level of care according to patient needs. Support, both to the patient and caregiver, should then be put into place.      You have been referred for a neuropsychological evaluation (i.e., evaluation of memory and thinking abilities). Please bring someone with you to this appointment if possible, as it is helpful for the doctor to hear from both you and another adult who knows you well. Please bring eyeglasses and hearing aids if you wear them.    The evaluation will take approximately 3 hours and has two parts:   The first part is a clinical interview with the neuropsychologist (Dr. Milbert Coulter or Dr. Roseanne Reno). During the interview, the neuropsychologist will speak with you and the individual you brought to the appointment.    The second part of the evaluation is testing with the doctor's technician Annabelle Harman or Selena Batten). During the testing, the technician will ask you to remember different types of material, solve problems, and answer some questionnaires. Your family member will not be present for  this portion of the evaluation.   Please note: We must reserve several hours of the neuropsychologist's time and the psychometrician's time for your evaluation appointment. As such, there is a No-Show fee of $100. If you are unable to attend  any of your appointments, please contact our office as soon as possible to reschedule.      DRIVING: Regarding driving, in patients with progressive memory problems, driving will be impaired. We advise to have someone else do the driving if trouble finding directions or if minor accidents are reported. Independent driving assessment is available to determine safety of driving.   If you are interested in the driving assessment, you can contact the following:  [facility] [employer] [facility] in [place] [phone]  [facility] [phone]  [facility] [phone]  [facility] [phone] or [phone]   FALL PRECAUTIONS: Be cautious when walking. Scan the area for obstacles that may increase the risk of trips and falls. When getting up in the mornings, sit up at the edge of the bed for a few minutes before getting out of bed. Consider elevating the bed at the head end to avoid drop of blood pressure when getting up. Walk always in a well-lit room (use night lights in the walls). Avoid area rugs or power cords from appliances in the middle of the walkways. Use a walker or a cane if necessary and consider physical therapy for balance exercise. Get your eyesight checked regularly.  FINANCIAL OVERSIGHT: Supervision, especially oversight when making financial decisions or transactions is also recommended.  HOME SAFETY: Consider the safety of the kitchen when operating appliances like stoves, microwave oven, and blender. Consider having supervision and share cooking responsibilities until no longer able to participate in those. Accidents with firearms and other hazards in the house should be identified and addressed as well.   ABILITY TO BE LEFT ALONE: If patient is unable to contact 911 operator, consider using LifeLine, or when the need is there, arrange for someone to stay with patients. Smoking is a fire hazard, consider supervision or cessation. Risk of  wandering should be assessed by caregiver and if detected at any point, supervision and safe proof recommendations should be instituted.  MEDICATION SUPERVISION: Inability to self-administer medication needs to be constantly addressed. Implement a mechanism to ensure safe administration of the medications.      Mediterranean Diet A Mediterranean diet refers to food and lifestyle choices that are based on the traditions of countries located on the [place]. This way of eating has been shown to help prevent certain conditions and improve outcomes for people who have chronic diseases, like kidney disease and heart disease. What are tips for following this plan? Lifestyle  Cook and eat meals together with your family, when possible. Drink enough fluid to keep your urine clear or pale yellow. Be physically active every day. This includes: Aerobic exercise like running or swimming. Leisure activities like gardening, walking, or housework. Get 7-8 hours of sleep each night. If recommended by your health care provider, drink red wine in moderation. This means 1 glass a day for nonpregnant women and 2 glasses a day for men. A glass of wine equals 5 oz (150 mL). Reading food labels  Check the serving size of packaged foods. For foods such as rice and pasta, the serving size refers to the amount of cooked product, not dry. Check the total fat in packaged foods. Avoid foods that have saturated fat or trans fats. Check the ingredients list for  added sugars, such as corn syrup. Shopping  At the grocery store, buy most of your food from the areas near the walls of the store. This includes: Fresh fruits and vegetables (produce). Grains, beans, nuts, and seeds. Some of these may be available in unpackaged forms or large amounts (in bulk). Fresh seafood. Poultry and eggs. Low-fat dairy products. Buy whole ingredients instead of prepackaged foods. Buy fresh fruits and vegetables in-season from  local farmers markets. Buy frozen fruits and vegetables in resealable bags. If you do not have access to quality fresh seafood, buy precooked frozen shrimp or canned fish, such as tuna, salmon, or sardines. Buy small amounts of raw or cooked vegetables, salads, or olives from the deli or salad bar at your store. Stock your pantry so you always have certain foods on hand, such as olive oil, canned tuna, canned tomatoes, rice, pasta, and beans. Cooking  Cook foods with extra-virgin olive oil instead of using butter or other vegetable oils. Have meat as a side dish, and have vegetables or grains as your main dish. This means having meat in small portions or adding small amounts of meat to foods like pasta or stew. Use beans or vegetables instead of meat in common dishes like chili or lasagna. Experiment with different cooking methods. Try roasting or broiling vegetables instead of steaming or sauteing them. Add frozen vegetables to soups, stews, pasta, or rice. Add nuts or seeds for added healthy fat at each meal. You can add these to yogurt, salads, or vegetable dishes. Marinate fish or vegetables using olive oil, lemon juice, garlic, and fresh herbs. Meal planning  Plan to eat 1 vegetarian meal one day each week. Try to work up to 2 vegetarian meals, if possible. Eat seafood 2 or more times a week. Have healthy snacks readily available, such as: Vegetable sticks with hummus. Greek yogurt. Fruit and nut trail mix. Eat balanced meals throughout the week. This includes: Fruit: 2-3 servings a day Vegetables: 4-5 servings a day Low-fat dairy: 2 servings a day Fish, poultry, or lean meat: 1 serving a day Beans and legumes: 2 or more servings a week Nuts and seeds: 1-2 servings a day Whole grains: 6-8 servings a day Extra-virgin olive oil: 3-4 servings a day Limit red meat and sweets to only a few servings a month What are my food choices? Mediterranean diet Recommended Grains: Whole-grain  pasta. Brown rice. Bulgar wheat. Polenta. Couscous. Whole-wheat bread. Orpah Cobb. Vegetables: Artichokes. Beets. Broccoli. Cabbage. Carrots. Eggplant. Green beans. Chard. Kale. Spinach. Onions. Leeks. Peas. Squash. Tomatoes. Peppers. Radishes. Fruits: Apples. Apricots. Avocado. Berries. Bananas. Cherries. Dates. Figs. Grapes. Lemons. Melon. Oranges. Peaches. Plums. Pomegranate. Meats and other protein foods: Beans. Almonds. Sunflower seeds. Pine nuts. Peanuts. Cod. Salmon. Scallops. Shrimp. Tuna. Tilapia. Clams. Oysters. Eggs. Dairy: Low-fat milk. Cheese. Greek yogurt. Beverages: Water. Red wine. Herbal tea. Fats and oils: Extra virgin olive oil. Avocado oil. Grape seed oil. Sweets and desserts: [place] yogurt with honey. Baked apples. Poached pears. Trail mix. Seasoning and other foods: Basil. Cilantro. Coriander. Cumin. Mint. Parsley. Sage. Rosemary. Tarragon. Garlic. Oregano. Thyme. Pepper. Balsalmic vinegar. Tahini. Hummus. Tomato sauce. Olives. Mushrooms. Limit these Grains: Prepackaged pasta or rice dishes. Prepackaged cereal with added sugar. Vegetables: Deep fried potatoes (french fries). Fruits: Fruit canned in syrup. Meats and other protein foods: Beef. Pork. Lamb. Poultry with skin. Hot dogs. Tomasa Blase. Dairy: Ice cream. Sour cream. Whole milk. Beverages: Juice. Sugar-sweetened soft drinks. Beer. Liquor and spirits. Fats and oils: Butter. Canola oil. Vegetable oil.  Beef fat (tallow). Lard. Sweets and desserts: Cookies. Cakes. Pies. Candy. Seasoning and other foods: Mayonnaise. Premade sauces and marinades. The items listed may not be a complete list. Talk with your dietitian about what dietary choices are right for you. Summary The Mediterranean diet includes both food and lifestyle choices. Eat a variety of fresh fruits and vegetables, beans, nuts, seeds, and whole grains. Limit the amount of red meat and sweets that you eat. Talk with your health care provider about whether it is  safe for you to drink red wine in moderation. This means 1 glass a day for nonpregnant women and 2 glasses a day for men. A glass of wine equals 5 oz (150 mL). This information is not intended to replace advice given to you by your health care provider. Make sure you discuss any questions you have with your health care provider. Document Released: 11/11/2015 Document Revised: 12/14/2015 Document Reviewed: 11/11/2015 Elsevier Interactive Patient Education  2017 [employer].

## 2023-06-13 NOTE — Progress Notes (Signed)
 Assessment/Plan:   Memory Impairment of unclear etiology  [name] [name] is a very pleasant 78 y.o. RH male with a history of hypertension, hyperlipidemia, hypothyroidism, carotid artery stenosis, CAD, aortic stenosis status post AVR, history of thoracic aneurysm, iron deficiency anemia, insomnia, anxiety, depression, history of hepatitis A in the past, history of retinal ischemia seen today in follow up to discuss the MRI of the brain results performed on 04/16/2023. These were personally reviewed, remarkable for small chronic cortical-subcortical infarct within the medial right parietal lobe, background of mild cerebral white matter chronic small vessel ischemic disease, several nonspecific chronic microhemorrhages scattered within the cerebellum and supratentorial brain, likely sequela of chronic hypertensive microangiopathy, chronic blood products along the anterior left frontal lobe from prior SAH, possible tiny chronic infarct within the inferior right cerebellar hemisphere, no acute findings, as well as mild to moderate generalized cerebral atrophy.  Patient is not currently on antidementia medication.  Last MoCA was 17/30.  At that time, the dementia medication was entertained, but patient decided to wait for  the results of the MRI were available.  We discussed initiating donepezil and its side effects, patient agrees to proceed. He is able to participate on his ADLs and drive without difficulty.  He continues to have significant anxiety "overthinking the condition ".   This patient is accompanied in the office by his wife who supplements the history.  Previous records as well as any outside records available were reviewed prior to todays visit.     Follow up in  6 months. Start donepezil 10 mg daily, side effects discussed   Continue B1 supplements Alcohol cessation counseled. Recommend B12 supplementation Patient scheduled for neuropsych evaluation for diagnostic clarity Recommend  psychotherapy for situational anxiety and depression Recommend good control of cardiovascular risk factors, follow-up with cardiology. Patient informed of elevated BP readings today Continue to control mood as per PCP, he is on Zoloft, Wellbutrin, and Xanax.  Initial visit 04/11/2023 How long did patient have memory difficulties?  For about 6 months. "I lost a friend and I recognize how they quickly lost it, how he deteriorated I became concerned, wanted to have myself checked "  Patient reports some difficulty remembering new information, recent conversations,  He admits  to forget the context of the conversation which is bothersome to him.  "I always was bad with names so it has always been a problem ".   repeats oneself?  Denies  Disoriented when walking into a room?  Patient denies    Leaving objects in unusual places?  Denies.   Wandering behavior? Denies.   Any personality changes, or depression, anxiety? "Anxiety, definitely".  "Had a horrible episode of depression when I retired, I should not have retired". 1 year ago could not get out of bed, etc, but friends helped me and kept me busy takes Xanax for the last 30 years, tried to quit it, but could not.   Hallucinations or paranoia? Denies.    Seizures? Denies.    Any sleep changes?  Does not sleep well. Takes Xanax to help him sleeps.  Denies vivid dreams, REM behavior or sleepwalking   Sleep apnea? Denies.   Any hygiene concerns?  Denies.   Independent of bathing and dressing? Endorsed  Does the patient need help with medications? Patient is in charge   Who is in charge of the finances? Patient  is in charge     Any changes in appetite?   Denies.     Patient have  trouble swallowing?  Denies.   Does the patient cook? No  Any headaches?  Denies.   Chronic pain? Denies.  Has some back pain," I changed some exercises ", but takes Advil with some relief.  Ambulates with difficulty? Goes to [employer], weightlifting and treadmill.  Recent  falls or head injuries? Denies.     Vision changes?  Denies any new issues.  Has a history of "L lazy eye ", had corrective surgery Any strokelike symptoms? Denies.   Any tremors? Denies.   Any anosmia? Denies.   Any incontinence of urine?  He has nocturia, history of BPH Any bowel dysfunction? "I have a tendency to get clogged up"     Patient lives with wife  History of heavy alcohol intake? A beer once a day  History of heavy tobacco use? Denies.   Family history of dementia? Mo and Fa died with  dementia ? Type.  Does patient drive? Yes, mostly to familiar places.He had to review the directions before driving.       CURRENT MEDICATIONS:  Outpatient Encounter Medications as of 06/13/2023  Medication Sig   ALPRAZolam (XANAX) 1 MG tablet TAKE 1 TABLET BY MOUTH AT BEDTIME AS NEEDED   amoxicillin (AMOXIL) 500 MG capsule Take 4 tablets by mouth 30-60 minutes prior to dental cleaning (Patient not taking: Reported on 04/11/2023)   Ascorbic Acid (VITAMIN C) 1000 MG tablet Take 1,000 mg by mouth daily.   aspirin EC 81 MG tablet Take 1 tablet (81 mg total) by mouth daily.   buPROPion (WELLBUTRIN SR) 200 MG 12 hr tablet Take 1 tablet (200 mg total) by mouth 2 (two) times daily.   calcium carbonate (OS-CAL) 600 MG TABS Take 600 mg by mouth in the morning.   Cholecalciferol (VITAMIN D3) 125 MCG (5000 UT) TABS Take 5,000 Units by mouth in the morning.   Evolocumab (REPATHA SURECLICK) 140 MG/ML SOAJ Inject 140 mg into the skin every 14 (fourteen) days.   Ferrous Sulfate (IRON) 325 (65 FE) MG TABS Take 325 mg by mouth in the morning.   Glucosamine-Chondroitin (COSAMIN DS PO) Take 1 tablet by mouth in the morning.   levothyroxine (SYNTHROID) 25 MCG tablet Take 25 mcg by mouth daily before breakfast.   losartan (COZAAR) 100 MG tablet Take 1 tablet by mouth once daily   magnesium oxide (MAG-OX) 400 (240 Mg) MG tablet Take 1,200 mg by mouth in the morning.   metoprolol succinate (TOPROL-XL) 25 MG 24 hr  tablet Take 1 tablet (25 mg total) by mouth daily.   Omega-3 Fatty Acids (FISH OIL PO) Take 1,000 mg by mouth in the morning.   Polyethyl Glycol-Propyl Glycol (SYSTANE ULTRA) 0.4-0.3 % SOLN Place 1-2 drops into both eyes See admin instructions. Instill 1 drop into both eyes (scheduled) twice daily & may use as needed for dry/irritated eyes.   QUERCETIN PO Take 1,000 mg by mouth in the morning.   sertraline (ZOLOFT) 50 MG tablet Take 25 mg by mouth in the morning.   zinc gluconate 50 MG tablet Take 50 mg by mouth in the morning.   No facility-administered encounter medications on file as of 06/13/2023.        No data to display            04/11/2023    5:00 PM  Montreal Cognitive Assessment   Visuospatial/ Executive (0/5) 1  Naming (0/3) 3  Attention: Read list of digits (0/2) 1  Attention: Read list of letters (0/1) 1  Attention: Serial 7 subtraction starting at 100 (0/3) 1  Language: Repeat phrase (0/2) 2  Language : Fluency (0/1) 1  Abstraction (0/2) 2  Delayed Recall (0/5) 0  Orientation (0/6) 4  Total 16  Adjusted Score (based on education) 17   Thank you for allowing [place] the opportunity to participate in the care of this nice patient. Please do not hesitate to contact us for any questions or concerns.   Total time spent on today's visit was 30 minutes dedicated to this patient today, preparing to see patient, examining the patient, ordering tests and/or medications and counseling the patient, documenting clinical information in the EHR or other health record, independently interpreting results and communicating results to the patient/family, discussing treatment and goals, answering patient's questions and coordinating care.  Cc:  Tisovec, Adelfa Koh, MD  Marlowe Kays 06/13/2023 6:22 AM

## 2023-06-19 ENCOUNTER — Ambulatory Visit: Payer: PPO | Admitting: Physician Assistant

## 2023-06-25 DIAGNOSIS — R310 Gross hematuria: Secondary | ICD-10-CM | POA: Diagnosis not present

## 2023-06-25 DIAGNOSIS — Z87891 Personal history of nicotine dependence: Secondary | ICD-10-CM | POA: Diagnosis not present

## 2023-06-25 DIAGNOSIS — N3281 Overactive bladder: Secondary | ICD-10-CM | POA: Diagnosis not present

## 2023-06-25 DIAGNOSIS — Z87442 Personal history of urinary calculi: Secondary | ICD-10-CM | POA: Diagnosis not present

## 2023-06-25 DIAGNOSIS — N281 Cyst of kidney, acquired: Secondary | ICD-10-CM | POA: Diagnosis not present

## 2023-07-09 DIAGNOSIS — R310 Gross hematuria: Secondary | ICD-10-CM | POA: Diagnosis not present

## 2023-07-13 ENCOUNTER — Ambulatory Visit: Payer: PPO | Admitting: Physician Assistant

## 2023-07-19 DIAGNOSIS — E039 Hypothyroidism, unspecified: Secondary | ICD-10-CM | POA: Diagnosis not present

## 2023-07-19 DIAGNOSIS — R82998 Other abnormal findings in urine: Secondary | ICD-10-CM | POA: Diagnosis not present

## 2023-07-19 DIAGNOSIS — N401 Enlarged prostate with lower urinary tract symptoms: Secondary | ICD-10-CM | POA: Diagnosis not present

## 2023-07-19 DIAGNOSIS — E7800 Pure hypercholesterolemia, unspecified: Secondary | ICD-10-CM | POA: Diagnosis not present

## 2023-07-19 DIAGNOSIS — I10 Essential (primary) hypertension: Secondary | ICD-10-CM | POA: Diagnosis not present

## 2023-07-23 DIAGNOSIS — R972 Elevated prostate specific antigen [PSA]: Secondary | ICD-10-CM | POA: Diagnosis not present

## 2023-07-27 DIAGNOSIS — F5104 Psychophysiologic insomnia: Secondary | ICD-10-CM | POA: Diagnosis not present

## 2023-07-27 DIAGNOSIS — R972 Elevated prostate specific antigen [PSA]: Secondary | ICD-10-CM | POA: Diagnosis not present

## 2023-07-27 DIAGNOSIS — Z1331 Encounter for screening for depression: Secondary | ICD-10-CM | POA: Diagnosis not present

## 2023-07-27 DIAGNOSIS — Z0000 Encounter for general adult medical examination without abnormal findings: Secondary | ICD-10-CM | POA: Diagnosis not present

## 2023-07-27 DIAGNOSIS — E7800 Pure hypercholesterolemia, unspecified: Secondary | ICD-10-CM | POA: Diagnosis not present

## 2023-07-27 DIAGNOSIS — Z952 Presence of prosthetic heart valve: Secondary | ICD-10-CM | POA: Diagnosis not present

## 2023-07-27 DIAGNOSIS — I712 Thoracic aortic aneurysm, without rupture, unspecified: Secondary | ICD-10-CM | POA: Diagnosis not present

## 2023-07-27 DIAGNOSIS — E039 Hypothyroidism, unspecified: Secondary | ICD-10-CM | POA: Diagnosis not present

## 2023-07-27 DIAGNOSIS — F411 Generalized anxiety disorder: Secondary | ICD-10-CM | POA: Diagnosis not present

## 2023-07-27 DIAGNOSIS — I10 Essential (primary) hypertension: Secondary | ICD-10-CM | POA: Diagnosis not present

## 2023-07-27 DIAGNOSIS — Z1339 Encounter for screening examination for other mental health and behavioral disorders: Secondary | ICD-10-CM | POA: Diagnosis not present

## 2023-07-27 DIAGNOSIS — D692 Other nonthrombocytopenic purpura: Secondary | ICD-10-CM | POA: Diagnosis not present

## 2023-08-01 DIAGNOSIS — C672 Malignant neoplasm of lateral wall of bladder: Secondary | ICD-10-CM | POA: Diagnosis not present

## 2023-08-01 DIAGNOSIS — N401 Enlarged prostate with lower urinary tract symptoms: Secondary | ICD-10-CM | POA: Diagnosis not present

## 2023-08-01 DIAGNOSIS — R350 Frequency of micturition: Secondary | ICD-10-CM | POA: Diagnosis not present

## 2023-08-01 DIAGNOSIS — R310 Gross hematuria: Secondary | ICD-10-CM | POA: Diagnosis not present

## 2023-08-28 ENCOUNTER — Ambulatory Visit: Payer: PPO | Admitting: Cardiology

## 2023-09-06 ENCOUNTER — Other Ambulatory Visit: Payer: Self-pay | Admitting: Cardiology

## 2023-09-09 NOTE — Progress Notes (Unsigned)
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 no significant CAD.  Echo in 07/2021 was stable. Coronary CT angiogram in 07/2022 revealed coronary calcium  score of 568 (61st percentile), mild nonobstructive CAD. Stable with no anginal symptoms. No indication for ischemic evaluation.  Continue aspirin , metoprolol , losartan .   2. Aortic stenosis/aortic dilation/mitral valve regurgitation: S/p Bentall procedure. Most recent echo inn 08/2022 showed EF 60 to 65%, normal LV function, no RWMA, mild concentric LVH, G2 DD, normal RV, moderate to severe mitral valve regurgitation with posterior leaflet restriction stable aortic valve replacement.  Follow-up TEE showed normal LV function, moderate mitral valve regurgitation, normally functioning aortic valve prosthesis. Asymptomatic, euvolemic and well compensated on exam.  Will update echocardiogram for routine monitoring. Continue SBE prophylaxis.  3. Hypertension: BP initially elevated upon  arrival today, improved with recheck.  Continue current antihypertensive regimen.    4. Dyslipidemia: LDL was 126 in 07/2023.  Statin intolerant. Repatha  was previously prescribed and approved, however, he never started taking the medication.  I will reach out to our Pharm.D. team to see if he is eligible at this time to start Repatha .   5. Memory impairment: Previously noted, aggressive.  On donepezil .  Following with neurology.   6. Disposition:   Follow-up in 4 to 6 months, sooner if needed.      Jude Norton, NP 09/10/2023, 7:55 PM

## 2023-09-10 ENCOUNTER — Ambulatory Visit: Payer: PPO | Attending: Nurse Practitioner | Admitting: Nurse Practitioner

## 2023-09-10 ENCOUNTER — Encounter: Payer: Self-pay | Admitting: Nurse Practitioner

## 2023-09-10 VITALS — BP 128/72 | Ht 72.0 in | Wt 189.8 lb

## 2023-09-10 DIAGNOSIS — I2510 Atherosclerotic heart disease of native coronary artery without angina pectoris: Secondary | ICD-10-CM

## 2023-09-10 DIAGNOSIS — I340 Nonrheumatic mitral (valve) insufficiency: Secondary | ICD-10-CM | POA: Diagnosis not present

## 2023-09-10 DIAGNOSIS — Z952 Presence of prosthetic heart valve: Secondary | ICD-10-CM | POA: Diagnosis not present

## 2023-09-10 DIAGNOSIS — I350 Nonrheumatic aortic (valve) stenosis: Secondary | ICD-10-CM | POA: Diagnosis not present

## 2023-09-10 DIAGNOSIS — E785 Hyperlipidemia, unspecified: Secondary | ICD-10-CM

## 2023-09-10 DIAGNOSIS — I10 Essential (primary) hypertension: Secondary | ICD-10-CM | POA: Diagnosis not present

## 2023-09-10 DIAGNOSIS — I77810 Thoracic aortic ectasia: Secondary | ICD-10-CM

## 2023-09-10 NOTE — Patient Instructions (Signed)
 Medication Instructio Your physician recommends that you continue on your current medications as directed. Please refer to the Current Medication list given to you today.  Lab Work: NONE ordered at this time of appointment   Testing/Procedures: Your physician has requested that you have an echocardiogram. Echocardiography is a painless test that uses sound waves to create images of your heart. It provides your doctor with information about the size and shape of your heart and how well your heart's chambers and valves are working. This procedure takes approximately one hour. There are no restrictions for this procedure. Please do NOT wear cologne, perfume, aftershave, or lotions (deodorant is allowed). Please arrive 15 minutes prior to your appointment time.  Please note: We ask at that you not bring children with you during ultrasound (echo/ vascular) testing. Due to room size and safety concerns, children are not allowed in the ultrasound rooms during exams. Our front office staff cannot provide observation of children in our lobby area while testing is being conducted. An adult accompanying a patient to their appointment will only be allowed in the ultrasound room at the discretion of the ultrasound technician under special circumstances. We apologize for any inconvenience.   Follow-Up: At [facility], you and your health needs are our priority.  As part of our continuing mission to provide you with exceptional heart care, our providers are all part of one team.  This team includes your primary Cardiologist (physician) and Advanced Practice Providers or APPs (Physician Assistants and Nurse Practitioners) who all work together to provide you with the care you need, when you need it.  Your next appointment:   4-6 month(s)  Provider:   Peter Swaziland, MD    We recommend signing up for the patient portal called "MyChart".  Sign up information is provided on this After Visit Summary.   MyChart is used to connect with patients for Virtual Visits (Telemedicine).  Patients are able to view lab/test results, encounter notes, upcoming appointments, etc.  Non-urgent messages can be sent to your provider as well.   To learn more about what you can do with MyChart, go to [email or web address].

## 2023-09-11 ENCOUNTER — Other Ambulatory Visit (HOSPITAL_COMMUNITY): Payer: Self-pay

## 2023-09-11 ENCOUNTER — Telehealth: Payer: Self-pay

## 2023-09-11 DIAGNOSIS — E785 Hyperlipidemia, unspecified: Secondary | ICD-10-CM

## 2023-09-11 DIAGNOSIS — E7800 Pure hypercholesterolemia, unspecified: Secondary | ICD-10-CM

## 2023-09-11 NOTE — Telephone Encounter (Signed)
 Pharmacy Patient Advocate Encounter   Received notification from Physician's Office that prior authorization for REPATHA  is required/requested.   Insurance verification completed.   The patient is insured through [facility] ADVANTAGE/RX ADVANCE .   Per test claim: PA required; PA started via CoverMyMeds. KEY [id number] . Please see clinical question(s) below that I am not finding the answer to in their chart and advise.  Plan requires explanation if labs were not checked within the last 120 days. Per Sherline Distel, NP okay to order updated panel.    *Team, can we order an updated lipid panel for patient?

## 2023-09-11 NOTE — Telephone Encounter (Signed)
-----   Message from Jude Norton sent at 09/10/2023  7:50 PM EDT ----- Regarding: Repatha  Hi guys.  I saw this patient for follow-up in clinic today.  He never actually started taking Repatha .  Not sure if his prior authorization has expired.  I told him I would reach out to you all to see about getting him set up to hopefully start.  Thanks so much for your help.  -Sherline Distel

## 2023-09-12 DIAGNOSIS — Z7982 Long term (current) use of aspirin: Secondary | ICD-10-CM | POA: Diagnosis not present

## 2023-09-12 DIAGNOSIS — Z79890 Hormone replacement therapy (postmenopausal): Secondary | ICD-10-CM | POA: Diagnosis not present

## 2023-09-12 DIAGNOSIS — Z87442 Personal history of urinary calculi: Secondary | ICD-10-CM | POA: Diagnosis not present

## 2023-09-12 DIAGNOSIS — N400 Enlarged prostate without lower urinary tract symptoms: Secondary | ICD-10-CM | POA: Diagnosis not present

## 2023-09-12 DIAGNOSIS — N281 Cyst of kidney, acquired: Secondary | ICD-10-CM | POA: Diagnosis not present

## 2023-09-12 DIAGNOSIS — D414 Neoplasm of uncertain behavior of bladder: Secondary | ICD-10-CM | POA: Diagnosis not present

## 2023-09-12 DIAGNOSIS — D400 Neoplasm of uncertain behavior of prostate: Secondary | ICD-10-CM | POA: Diagnosis not present

## 2023-09-12 DIAGNOSIS — Z79899 Other long term (current) drug therapy: Secondary | ICD-10-CM | POA: Diagnosis not present

## 2023-09-12 DIAGNOSIS — N3289 Other specified disorders of bladder: Secondary | ICD-10-CM | POA: Diagnosis not present

## 2023-09-12 DIAGNOSIS — N3020 Other chronic cystitis without hematuria: Secondary | ICD-10-CM | POA: Diagnosis not present

## 2023-09-12 DIAGNOSIS — N4289 Other specified disorders of prostate: Secondary | ICD-10-CM | POA: Diagnosis not present

## 2023-09-17 DIAGNOSIS — N3289 Other specified disorders of bladder: Secondary | ICD-10-CM | POA: Diagnosis not present

## 2023-09-20 DIAGNOSIS — N401 Enlarged prostate with lower urinary tract symptoms: Secondary | ICD-10-CM | POA: Diagnosis not present

## 2023-09-20 DIAGNOSIS — N3281 Overactive bladder: Secondary | ICD-10-CM | POA: Diagnosis not present

## 2023-09-20 DIAGNOSIS — N138 Other obstructive and reflux uropathy: Secondary | ICD-10-CM | POA: Diagnosis not present

## 2023-10-02 DIAGNOSIS — L570 Actinic keratosis: Secondary | ICD-10-CM | POA: Diagnosis not present

## 2023-10-02 DIAGNOSIS — L814 Other melanin hyperpigmentation: Secondary | ICD-10-CM | POA: Diagnosis not present

## 2023-10-02 DIAGNOSIS — L720 Epidermal cyst: Secondary | ICD-10-CM | POA: Diagnosis not present

## 2023-10-02 DIAGNOSIS — D225 Melanocytic nevi of trunk: Secondary | ICD-10-CM | POA: Diagnosis not present

## 2023-10-02 DIAGNOSIS — C44519 Basal cell carcinoma of skin of other part of trunk: Secondary | ICD-10-CM | POA: Diagnosis not present

## 2023-10-02 DIAGNOSIS — D045 Carcinoma in situ of skin of trunk: Secondary | ICD-10-CM | POA: Diagnosis not present

## 2023-10-02 DIAGNOSIS — Z85828 Personal history of other malignant neoplasm of skin: Secondary | ICD-10-CM | POA: Diagnosis not present

## 2023-10-02 DIAGNOSIS — L821 Other seborrheic keratosis: Secondary | ICD-10-CM | POA: Diagnosis not present

## 2023-10-10 ENCOUNTER — Ambulatory Visit: Admitting: Physician Assistant

## 2023-10-10 VITALS — BP 152/88 | HR 62 | Wt 185.6 lb

## 2023-10-10 DIAGNOSIS — R413 Other amnesia: Secondary | ICD-10-CM | POA: Diagnosis not present

## 2023-10-10 MED ORDER — DONEPEZIL HCL 10 MG PO TABS: 10.0000 mg | ORAL_TABLET | Freq: Every morning | ORAL | 4 refills | Status: AC

## 2023-10-10 NOTE — Progress Notes (Signed)
 Assessment/Plan:   Memory impairment of unclear etiology   [name] [name] is a very pleasant 78 y.o. RH male with a history of hypertension, hyperlipidemia, hypothyroidism, carotid artery stenosis, CAD, aortic stenosis status post AVR, history of thoracic aneurysm, iron deficiency anemia, insomnia, anxiety, depression, history of hepatitis A in the past, history of retinal ischemia seen today in follow up for memory loss. Patient is currently on donepezil  10 mg daily, tolerating well.  He reports feeling much better, back to my normal.  In today's visit, memory is stable, with MMSE of 28/30 patient is able to participate on ADLs and to drive without significant difficulties.  Mood is improved.    Follow up in 7 months  Continue donepezil  10 mg daily, side effects discussed  Patient is scheduled for neuropsych evaluation in the near future for diagnostic clarity Continue B1 supplements Recommend good control of her cardiovascular risk factors Continue to control mood as per PCP      Subjective:    This patient is here alone. Previous records as well as any outside records available were reviewed prior to todays visit. Patient was last seen on 06/13/2023, with last MoCA on 05/24/2023 17/30.    Any changes in memory since last visit? I am doing a lot better, back to my normal, I don't need to be here-he says.  He denies any short-term memory issues the medicine may helping in the memory .  My friends have noticed a tremendous change, telling me that I am back to normal .  Just came back from a cruise which helped my mood, going back to the gym .  He likes playing dominoes with his friends. repeats oneself?  Endorsed Disoriented when walking into a room? Denies    Leaving objects?  May misplace things but not in unusual places   Wandering behavior?  denies   Any personality changes since last visit?  I am not anxious .  He takes Xanax  for the last 30 years tried to quit but I  could not  Any worsening depression?:  Denies.   Hallucinations or paranoia?  Denies.   Seizures? denies    Any sleep changes?  Sleeps well with Xanax  to help him sleep.  Denies vivid dreams, REM behavior or sleepwalking   Sleep apnea?   Denies.   Any hygiene concerns? Denies.  Independent of bathing and dressing?  Endorsed  Does the patient needs help with medications? Patient is in charge   Who is in charge of the finances?  Patient is in charge     Any changes in appetite?  Denies.     Patient have trouble swallowing? Denies.   Does the patient cook? No Any headaches?   Denies.   Any vision changes?  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 Chronic pain?  Endorsed, some back pain but does weightlifting and treadmill at [employer]. More disciplined since coming from my cruise Ambulates with difficulty? Denies  Recent falls or head injuries? Denies.     Unilateral weakness, numbness or tingling? denies  Any tremors?  Very minimal, hands  Any anosmia?  Denies   Any incontinence of urine? Denies.  history of BPH, had removal 3 weeks ago.   Any bowel dysfunction?   Denies after starting to double on Mg Patient lives with his wife  Does the patient drive?  Yes, including long distances just came back from [place]  and I have no problems  Alcohol ?  Drinks a beer once a day    Initial visit 04/11/2023 How long did patient have memory difficulties?  For about 6 months. I lost a friend and I recognize how they quickly lost it, how he deteriorated I became concerned, wanted to have myself checked   Patient reports some difficulty remembering new information, recent conversations,  He admits  to forget the context of the conversation which is bothersome to him.  I always was bad with names so it has always been a problem .   repeats oneself?  Denies  Disoriented when walking into a room?  Patient denies    Leaving objects in unusual places?  Denies.   Wandering behavior?  Denies.   Any personality changes, or depression, anxiety? Anxiety, definitely.  Had a horrible episode of depression when I retired, I should not have retired. 1 year ago could not get out of bed, etc, but friends helped me and kept me busy takes Xanax  for the last 30 years, tried to quit it, but could not.   Hallucinations or paranoia? Denies.    Seizures? Denies.    Any sleep changes?  Does not sleep well. Takes Xanax  to help him sleeps.  Denies vivid dreams, REM behavior or sleepwalking   Sleep apnea? Denies.   Any hygiene concerns?  Denies.   Independent of bathing and dressing? Endorsed  Does the patient need help with medications? Patient is in charge   Who is in charge of the finances? Patient  is in charge     Any changes in appetite?   Denies.     Patient have trouble swallowing?  Denies.   Does the patient cook? No  Any headaches?  Denies.   Chronic pain? Denies.  Has some back pain, I changed some exercises , but takes Advil with some relief.  Ambulates with difficulty? Goes to [employer], weightlifting and treadmill.  Recent falls or head injuries? Denies.     Vision changes?  Denies any new issues.  Has a history of L lazy eye , had corrective surgery Any strokelike symptoms? Denies.   Any tremors? Denies.   Any anosmia? Denies.   Any incontinence of urine?  He has nocturia, history of BPH Any bowel dysfunction? I have a tendency to get clogged up     Patient lives with wife  History of heavy alcohol  intake? A beer once a day  History of heavy tobacco use? Denies.   Family history of dementia? Mo and Fa died with  dementia ? Type.  Does patient drive? Yes, mostly to familiar places. He had to review the directions before driving.       MRI of the brain 04/16/2023  personally reviewed, remarkable for small chronic cortical-subcortical infarct within the medial right parietal lobe, background of mild cerebral white matter chronic small vessel ischemic disease,  several nonspecific chronic microhemorrhages scattered within the cerebellum and supratentorial brain, likely sequela of chronic hypertensive microangiopathy, chronic blood products along the anterior left frontal lobe from prior SAH, possible tiny chronic infarct within the inferior right cerebellar hemisphere, no acute findings, as well as mild to moderate generalized cerebral atrophy.   PREVIOUS MEDICATIONS:   CURRENT MEDICATIONS:  Outpatient Encounter Medications as of 10/10/2023  Medication Sig   ALPRAZolam  (XANAX ) 1 MG tablet TAKE 1 TABLET BY MOUTH AT BEDTIME AS NEEDED   amoxicillin  (AMOXIL ) 500 MG capsule Take 4 tablets by mouth 30-60 minutes prior to dental cleaning   Ascorbic Acid (VITAMIN C) 1000 MG  tablet Take 1,000 mg by mouth daily.   aspirin  EC 81 MG tablet Take 1 tablet (81 mg total) by mouth daily.   buPROPion  (WELLBUTRIN  SR) 200 MG 12 hr tablet Take 1 tablet (200 mg total) by mouth 2 (two) times daily.   calcium  carbonate (OS-CAL) 600 MG TABS Take 600 mg by mouth in the morning.   Cholecalciferol (VITAMIN D3) 125 MCG (5000 UT) TABS Take 5,000 Units by mouth in the morning.   Evolocumab  (REPATHA  SURECLICK) 140 MG/ML SOAJ Inject 140 mg into the skin every 14 (fourteen) days.   Ferrous Sulfate (IRON) 325 (65 FE) MG TABS Take 325 mg by mouth in the morning.   Ginkgo Biloba 40 MG TABS Take 60 tablets by mouth daily.   Glucosamine-Chondroitin (COSAMIN DS PO) Take 1 tablet by mouth in the morning.   levothyroxine (SYNTHROID) 25 MCG tablet Take 25 mcg by mouth daily before breakfast.   losartan  (COZAAR ) 100 MG tablet Take 1 tablet by mouth once daily   magnesium  oxide (MAG-OX) 400 (240 Mg) MG tablet Take 1,200 mg by mouth in the morning.   metoprolol  succinate (TOPROL -XL) 25 MG 24 hr tablet Take 1 tablet (25 mg total) by mouth daily.   Omega-3 Fatty Acids (FISH OIL PO) Take 1,000 mg by mouth in the morning.   Polyethyl Glycol-Propyl Glycol (SYSTANE ULTRA) 0.4-0.3 % SOLN Place 1-2 drops  into both eyes See admin instructions. Instill 1 drop into both eyes (scheduled) twice daily & may use as needed for dry/irritated eyes.   Quercetin 500 MG CAPS Take 500 tablets by mouth daily.   QUERCETIN PO Take 1,000 mg by mouth in the morning.   sertraline (ZOLOFT) 50 MG tablet Take 25 mg by mouth in the morning.   Turmeric (QC TUMERIC COMPLEX PO) Take 500 tablets by mouth daily.   zinc gluconate 50 MG tablet Take 50 mg by mouth in the morning.   [DISCONTINUED] donepezil  (ARICEPT ) 10 MG tablet Take half tablet (5 mg) daily for 2 weeks, then increase to the full tablet at 10 mg daily   donepezil  (ARICEPT ) 10 MG tablet Take 1 tablet (10 mg total) by mouth every morning. Take 1 tab daily   No facility-administered encounter medications on file as of 10/10/2023.       10/10/2023   12:00 PM  MMSE - Mini Mental State Exam  Orientation to time 5  Orientation to Place 5  Registration 3  Attention/ Calculation 4  Recall 2  Language- name 2 objects 2  Language- repeat 1  Language- follow 3 step command 3  Language- read & follow direction 1  Write a sentence 1  Copy design 1  Total score 28      04/11/2023    5:00 PM  Montreal Cognitive Assessment   Visuospatial/ Executive (0/5) 1  Naming (0/3) 3  Attention: Read list of digits (0/2) 1  Attention: Read list of letters (0/1) 1  Attention: Serial 7 subtraction starting at 100 (0/3) 1  Language: Repeat phrase (0/2) 2  Language : Fluency (0/1) 1  Abstraction (0/2) 2  Delayed Recall (0/5) 0  Orientation (0/6) 4  Total 16  Adjusted Score (based on education) 17    Objective:     PHYSICAL EXAMINATION:    VITALS:   Vitals:   10/10/23 1126  BP: (!) 152/88  Pulse: 62  SpO2: 99%  Weight: 185 lb 9.6 oz (84.2 kg)    GEN:  The patient appears stated age and is in  NAD. HEENT:  Normocephalic, atraumatic.   Neurological examination:  General: NAD, well-groomed, appears stated age. Orientation: The patient is alert. Oriented to  person, place and date Cranial nerves: There is good facial symmetry.The speech is fluent and clear. No aphasia or dysarthria. Fund of knowledge is appropriate. Recent memory impaired, remote memory normal.. Attention and concentration are normal.  Able to name objects and repeat phrases.  Hearing is intact to conversational tone.   Sensation: Sensation is intact to light touch throughout Motor: Strength is at least antigravity x4. DTR's 2/4 in UE/LE     Movement examination: Tone: There is normal tone in the UE/LE Abnormal movements:  no tremor was noted during this visit.  No myoclonus.  No asterixis.   Coordination:  There is no decremation with RAM's. Normal finger to nose  Gait and Station: The patient has no  difficulty arising out of a deep-seated chair without the use of the hands. The patient's stride length is good.  Gait is cautious and narrow.    Thank you for allowing us  the opportunity to participate in the care of this nice patient. Please do not hesitate to contact us  for any questions or concerns.   Total time spent on today's visit was 34 minutes dedicated to this patient today, preparing to see patient, examining the patient, ordering tests and/or medications and counseling the patient, documenting clinical information in the EHR or other health record, independently interpreting results and communicating results to the patient/family, discussing treatment and goals, answering patient's questions and coordinating care.  Cc:  Tisovec, Charlie ORN, MD  Camie Sevin 10/10/2023 12:38 PM

## 2023-10-23 DIAGNOSIS — D045 Carcinoma in situ of skin of trunk: Secondary | ICD-10-CM | POA: Diagnosis not present

## 2023-10-23 DIAGNOSIS — Z85828 Personal history of other malignant neoplasm of skin: Secondary | ICD-10-CM | POA: Diagnosis not present

## 2023-10-25 ENCOUNTER — Ambulatory Visit (HOSPITAL_COMMUNITY)
Admission: RE | Admit: 2023-10-25 | Discharge: 2023-10-25 | Disposition: A | Discharge status: Home / Self Care | Source: Ambulatory Visit | Attending: Cardiovascular Disease | Admitting: Cardiovascular Disease

## 2023-10-25 DIAGNOSIS — I340 Nonrheumatic mitral (valve) insufficiency: Secondary | ICD-10-CM | POA: Insufficient documentation

## 2023-10-25 DIAGNOSIS — I350 Nonrheumatic aortic (valve) stenosis: Secondary | ICD-10-CM | POA: Insufficient documentation

## 2023-10-25 DIAGNOSIS — I77810 Thoracic aortic ectasia: Secondary | ICD-10-CM | POA: Insufficient documentation

## 2023-10-25 DIAGNOSIS — Z952 Presence of prosthetic heart valve: Secondary | ICD-10-CM | POA: Diagnosis not present

## 2023-10-25 LAB — ECHOCARDIOGRAM COMPLETE
AR max vel: 2.27 cm2
AV Area VTI: 2.37 cm2
AV Area mean vel: 2.23 cm2
AV Mean grad: 7 mmHg
AV Peak grad: 15.7 mmHg
Ao pk vel: 1.98 m/s
Area-P 1/2: 4.29 cm2
MV M vel: 5.58 m/s
MV Peak grad: 124.5 mmHg
MV VTI: 2.58 cm2
Radius: 0.8 cm
S' Lateral: 3.8 cm

## 2023-10-31 ENCOUNTER — Other Ambulatory Visit: Payer: Self-pay | Admitting: Cardiology

## 2023-10-31 MED ORDER — ALPRAZOLAM 1 MG PO TABS: 1.0000 mg | ORAL_TABLET | Freq: Every evening | ORAL | 0 refills | Status: DC | PRN

## 2023-10-31 NOTE — Addendum Note (Signed)
 Addended by: NAIDA DENA BROCKS on: 10/31/2023 03:13 PM   Modules accepted: Orders

## 2023-11-03 ENCOUNTER — Other Ambulatory Visit: Payer: Self-pay | Admitting: Cardiology

## 2023-11-04 ENCOUNTER — Ambulatory Visit: Payer: Self-pay | Admitting: Nurse Practitioner

## 2023-11-10 ENCOUNTER — Other Ambulatory Visit: Payer: Self-pay | Admitting: Cardiology

## 2023-11-14 ENCOUNTER — Other Ambulatory Visit: Payer: Self-pay | Admitting: Cardiology

## 2023-11-14 NOTE — Telephone Encounter (Signed)
*  STAT* If patient is at the pharmacy, call can be transferred to refill team.   1. Which medications need to be refilled? (please list name of each medication and dose if known)   ALPRAZolam  (XANAX ) 1 MG tablet Take 1 tablet (1 mg total) by mouth at bedtime as needed.     4. Which pharmacy/location (including street and city if local pharmacy) is medication to be sent to?  [facility] [address] [place] - [address] Phone: [phone]  Fax: [phone]       5. Do they need a 30 day or 90 day supply? 90

## 2023-11-15 ENCOUNTER — Other Ambulatory Visit: Payer: Self-pay | Admitting: Cardiology

## 2023-11-15 NOTE — Telephone Encounter (Signed)
 Pt of Dr. Swaziland is requesting a refill of this non Cardiac RX. Does Dr. Swaziland want to refill? Please advise

## 2023-11-16 ENCOUNTER — Other Ambulatory Visit: Payer: Self-pay

## 2023-11-16 ENCOUNTER — Telehealth: Payer: Self-pay | Admitting: Cardiology

## 2023-11-16 ENCOUNTER — Other Ambulatory Visit: Payer: Self-pay | Admitting: Cardiology

## 2023-11-16 MED ORDER — ALPRAZOLAM 1 MG PO TABS: 1.0000 mg | ORAL_TABLET | Freq: Every evening | ORAL | 1 refills | Status: AC | PRN

## 2023-11-16 NOTE — Telephone Encounter (Signed)
 Already taken care of.See previous 8/15 telephone note.

## 2023-11-16 NOTE — Telephone Encounter (Signed)
*  STAT* If patient is at the pharmacy, call can be transferred to refill team.   1. Which medications need to be refilled? (please list name of each medication and dose if known)  ALPRAZolam  (XANAX ) 1 MG tablet  2. Which pharmacy/location (including street and city if local pharmacy) is medication to be sent to? [facility] [address], [place] - [address]    3. Do they need a 30 day or 90 day supply?  90 day supply  Wife says patient is completely out of medication.

## 2023-11-16 NOTE — Telephone Encounter (Signed)
*  STAT* If patient is at the pharmacy, call can be transferred to refill team.   1. Which medications need to be refilled? (please list name of each medication and dose if known) Alprazolam  1 Mg tab   2. Would you like to learn more about the convenience, safety, & potential cost savings by using the [facility]?    3. Are you open to using the [facility] (Type [facility]. ).   4. Which pharmacy/location (including street and city if local pharmacy) is medication to be sent to? [facility]   5. Do they need a 30 day or 90 day supply? 90 day supply  *Patient states he is out of the prescription*  Patient is here and wants the carry the physical prescription with him to [facility].

## 2023-11-16 NOTE — Telephone Encounter (Signed)
 Spoke to Pharm D at [facility]  called in.

## 2023-12-14 ENCOUNTER — Ambulatory Visit: Admitting: Physician Assistant

## 2024-01-21 DIAGNOSIS — F5104 Psychophysiologic insomnia: Secondary | ICD-10-CM | POA: Diagnosis not present

## 2024-01-21 DIAGNOSIS — I10 Essential (primary) hypertension: Secondary | ICD-10-CM | POA: Diagnosis not present

## 2024-01-21 DIAGNOSIS — F411 Generalized anxiety disorder: Secondary | ICD-10-CM | POA: Diagnosis not present

## 2024-01-21 DIAGNOSIS — I712 Thoracic aortic aneurysm, without rupture, unspecified: Secondary | ICD-10-CM | POA: Diagnosis not present

## 2024-01-21 DIAGNOSIS — D692 Other nonthrombocytopenic purpura: Secondary | ICD-10-CM | POA: Diagnosis not present

## 2024-01-21 DIAGNOSIS — E7800 Pure hypercholesterolemia, unspecified: Secondary | ICD-10-CM | POA: Diagnosis not present

## 2024-01-21 DIAGNOSIS — Z952 Presence of prosthetic heart valve: Secondary | ICD-10-CM | POA: Diagnosis not present

## 2024-01-21 DIAGNOSIS — E039 Hypothyroidism, unspecified: Secondary | ICD-10-CM | POA: Diagnosis not present

## 2024-01-21 DIAGNOSIS — N401 Enlarged prostate with lower urinary tract symptoms: Secondary | ICD-10-CM | POA: Diagnosis not present

## 2024-01-21 DIAGNOSIS — R972 Elevated prostate specific antigen [PSA]: Secondary | ICD-10-CM | POA: Diagnosis not present

## 2024-01-24 DIAGNOSIS — M19012 Primary osteoarthritis, left shoulder: Secondary | ICD-10-CM | POA: Diagnosis not present

## 2024-02-20 DIAGNOSIS — M19012 Primary osteoarthritis, left shoulder: Secondary | ICD-10-CM | POA: Diagnosis not present

## 2024-03-18 ENCOUNTER — Institutional Professional Consult (permissible substitution): Payer: PPO | Admitting: Psychology

## 2024-03-18 ENCOUNTER — Ambulatory Visit: Payer: Self-pay

## 2024-03-24 ENCOUNTER — Encounter: Payer: PPO | Admitting: Psychology

## 2024-03-28 ENCOUNTER — Other Ambulatory Visit: Payer: Self-pay | Admitting: Cardiology

## 2024-04-27 ENCOUNTER — Other Ambulatory Visit: Payer: Self-pay | Admitting: Nurse Practitioner

## 2024-05-06 ENCOUNTER — Ambulatory Visit: Admitting: Physician Assistant

## 2024-05-06 ENCOUNTER — Encounter: Payer: Self-pay | Admitting: Physician Assistant

## 2024-05-06 VITALS — BP 153/77 | HR 65 | Resp 20 | Wt 194.0 lb

## 2024-05-06 DIAGNOSIS — R413 Other amnesia: Secondary | ICD-10-CM

## 2024-07-16 ENCOUNTER — Institutional Professional Consult (permissible substitution): Payer: Self-pay | Admitting: Psychology

## 2024-07-16 ENCOUNTER — Ambulatory Visit: Payer: Self-pay

## 2024-07-23 ENCOUNTER — Encounter: Payer: Self-pay | Admitting: Psychology

## 2024-09-17 ENCOUNTER — Ambulatory Visit: Admitting: Nurse Practitioner

## 2024-11-21 ENCOUNTER — Ambulatory Visit: Payer: Self-pay | Admitting: Physician Assistant
